# Patient Record
Sex: Female | Born: 1985 | Race: Black or African American | Hispanic: No | Smoking: Never smoker
Health system: Southern US, Community
[De-identification: ages and names within clinical notes are randomized; demographics above are authoritative.]

## PROBLEM LIST (undated history)

## (undated) ENCOUNTER — Inpatient Hospital Stay (HOSPITAL_COMMUNITY): Payer: Self-pay

## (undated) DIAGNOSIS — R609 Edema, unspecified: Secondary | ICD-10-CM

## (undated) DIAGNOSIS — F32A Depression, unspecified: Secondary | ICD-10-CM

## (undated) DIAGNOSIS — Z822 Family history of deafness and hearing loss: Secondary | ICD-10-CM

## (undated) DIAGNOSIS — IMO0002 Reserved for concepts with insufficient information to code with codable children: Secondary | ICD-10-CM

## (undated) DIAGNOSIS — M2550 Pain in unspecified joint: Secondary | ICD-10-CM

## (undated) DIAGNOSIS — R0602 Shortness of breath: Secondary | ICD-10-CM

## (undated) DIAGNOSIS — N921 Excessive and frequent menstruation with irregular cycle: Secondary | ICD-10-CM

## (undated) DIAGNOSIS — R0789 Other chest pain: Secondary | ICD-10-CM

## (undated) DIAGNOSIS — R87619 Unspecified abnormal cytological findings in specimens from cervix uteri: Secondary | ICD-10-CM

## (undated) DIAGNOSIS — R600 Localized edema: Secondary | ICD-10-CM

## (undated) DIAGNOSIS — F419 Anxiety disorder, unspecified: Secondary | ICD-10-CM

## (undated) DIAGNOSIS — M7989 Other specified soft tissue disorders: Secondary | ICD-10-CM

## (undated) DIAGNOSIS — Z86018 Personal history of other benign neoplasm: Secondary | ICD-10-CM

## (undated) DIAGNOSIS — M549 Dorsalgia, unspecified: Secondary | ICD-10-CM

## (undated) DIAGNOSIS — F329 Major depressive disorder, single episode, unspecified: Secondary | ICD-10-CM

## (undated) DIAGNOSIS — A6000 Herpesviral infection of urogenital system, unspecified: Secondary | ICD-10-CM

## (undated) HISTORY — DX: Other chest pain: R07.89

## (undated) HISTORY — PX: COLPOSCOPY W/ BIOPSY / CURETTAGE: SUR283

## (undated) HISTORY — DX: Herpesviral infection of urogenital system, unspecified: A60.00

## (undated) HISTORY — DX: Localized edema: R60.0

## (undated) HISTORY — DX: Personal history of other benign neoplasm: Z86.018

## (undated) HISTORY — DX: Excessive and frequent menstruation with irregular cycle: N92.1

## (undated) HISTORY — DX: Other specified soft tissue disorders: M79.89

## (undated) HISTORY — DX: Family history of deafness and hearing loss: Z82.2

## (undated) HISTORY — DX: Depression, unspecified: F32.A

## (undated) HISTORY — DX: Pain in unspecified joint: M25.50

## (undated) HISTORY — DX: Dorsalgia, unspecified: M54.9

## (undated) HISTORY — DX: Edema, unspecified: R60.9

## (undated) HISTORY — DX: Major depressive disorder, single episode, unspecified: F32.9

## (undated) HISTORY — DX: Shortness of breath: R06.02

## (undated) HISTORY — DX: Anxiety disorder, unspecified: F41.9

---
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
## 2003-07-17 ENCOUNTER — Emergency Department (HOSPITAL_COMMUNITY): Admission: EM | Admit: 2003-07-17 | Discharge: 2003-07-17 | Payer: Self-pay | Admitting: Emergency Medicine

## 2003-08-06 ENCOUNTER — Other Ambulatory Visit: Admission: RE | Admit: 2003-08-06 | Discharge: 2003-08-06 | Payer: Self-pay | Admitting: *Deleted

## 2004-12-25 ENCOUNTER — Emergency Department (HOSPITAL_COMMUNITY): Admission: EM | Admit: 2004-12-25 | Discharge: 2004-12-25 | Payer: Self-pay | Admitting: Emergency Medicine

## 2005-08-13 ENCOUNTER — Inpatient Hospital Stay (HOSPITAL_COMMUNITY): Admission: AD | Admit: 2005-08-13 | Discharge: 2005-08-13 | Payer: Self-pay | Admitting: Obstetrics and Gynecology

## 2006-04-19 ENCOUNTER — Inpatient Hospital Stay (HOSPITAL_COMMUNITY): Admission: AD | Admit: 2006-04-19 | Discharge: 2006-04-19 | Payer: Self-pay | Admitting: Obstetrics

## 2006-04-23 ENCOUNTER — Ambulatory Visit (HOSPITAL_COMMUNITY): Admission: RE | Admit: 2006-04-23 | Discharge: 2006-04-23 | Payer: Self-pay | Admitting: Obstetrics

## 2006-06-06 ENCOUNTER — Inpatient Hospital Stay (HOSPITAL_COMMUNITY): Admission: AD | Admit: 2006-06-06 | Discharge: 2006-06-07 | Payer: Self-pay | Admitting: Obstetrics & Gynecology

## 2006-08-26 ENCOUNTER — Inpatient Hospital Stay (HOSPITAL_COMMUNITY): Admission: AD | Admit: 2006-08-26 | Discharge: 2006-08-26 | Payer: Self-pay | Admitting: Obstetrics & Gynecology

## 2006-09-12 ENCOUNTER — Inpatient Hospital Stay (HOSPITAL_COMMUNITY): Admission: AD | Admit: 2006-09-12 | Discharge: 2006-09-12 | Payer: Self-pay | Admitting: Obstetrics & Gynecology

## 2006-11-04 ENCOUNTER — Inpatient Hospital Stay (HOSPITAL_COMMUNITY): Admission: AD | Admit: 2006-11-04 | Discharge: 2006-11-04 | Payer: Self-pay | Admitting: Obstetrics & Gynecology

## 2006-11-15 ENCOUNTER — Inpatient Hospital Stay (HOSPITAL_COMMUNITY): Admission: AD | Admit: 2006-11-15 | Discharge: 2006-11-15 | Payer: Self-pay | Admitting: Obstetrics

## 2006-11-22 ENCOUNTER — Emergency Department (HOSPITAL_COMMUNITY): Admission: EM | Admit: 2006-11-22 | Discharge: 2006-11-22 | Payer: Self-pay | Admitting: Emergency Medicine

## 2006-12-11 ENCOUNTER — Inpatient Hospital Stay (HOSPITAL_COMMUNITY): Admission: AD | Admit: 2006-12-11 | Discharge: 2006-12-13 | Payer: Self-pay | Admitting: Obstetrics

## 2007-06-22 ENCOUNTER — Emergency Department (HOSPITAL_COMMUNITY): Admission: EM | Admit: 2007-06-22 | Discharge: 2007-06-22 | Payer: Self-pay | Admitting: Emergency Medicine

## 2007-07-16 ENCOUNTER — Encounter: Admission: RE | Admit: 2007-07-16 | Discharge: 2007-10-03 | Payer: Self-pay | Admitting: *Deleted

## 2007-07-17 ENCOUNTER — Emergency Department (HOSPITAL_COMMUNITY): Admission: EM | Admit: 2007-07-17 | Discharge: 2007-07-17 | Payer: Self-pay | Admitting: Emergency Medicine

## 2007-12-01 ENCOUNTER — Emergency Department (HOSPITAL_COMMUNITY): Admission: EM | Admit: 2007-12-01 | Discharge: 2007-12-01 | Payer: Self-pay | Admitting: Family Medicine

## 2007-12-28 ENCOUNTER — Inpatient Hospital Stay (HOSPITAL_COMMUNITY): Admission: AD | Admit: 2007-12-28 | Discharge: 2007-12-28 | Payer: Self-pay | Admitting: Obstetrics & Gynecology

## 2008-05-17 ENCOUNTER — Inpatient Hospital Stay (HOSPITAL_COMMUNITY): Admission: AD | Admit: 2008-05-17 | Discharge: 2008-05-18 | Payer: Self-pay | Admitting: Obstetrics

## 2008-08-08 ENCOUNTER — Emergency Department (HOSPITAL_COMMUNITY): Admission: EM | Admit: 2008-08-08 | Discharge: 2008-08-08 | Payer: Self-pay | Admitting: Family Medicine

## 2008-09-12 ENCOUNTER — Emergency Department (HOSPITAL_COMMUNITY): Admission: EM | Admit: 2008-09-12 | Discharge: 2008-09-12 | Payer: Self-pay | Admitting: Family Medicine

## 2009-05-25 ENCOUNTER — Inpatient Hospital Stay (HOSPITAL_COMMUNITY): Admission: AD | Admit: 2009-05-25 | Discharge: 2009-05-25 | Payer: Self-pay | Admitting: Obstetrics

## 2009-05-28 ENCOUNTER — Inpatient Hospital Stay (HOSPITAL_COMMUNITY): Admission: AD | Admit: 2009-05-28 | Discharge: 2009-05-29 | Payer: Self-pay | Admitting: Obstetrics & Gynecology

## 2009-06-23 ENCOUNTER — Inpatient Hospital Stay (HOSPITAL_COMMUNITY): Admission: AD | Admit: 2009-06-23 | Discharge: 2009-06-23 | Payer: Self-pay | Admitting: Obstetrics & Gynecology

## 2009-08-06 ENCOUNTER — Inpatient Hospital Stay (HOSPITAL_COMMUNITY): Admission: AD | Admit: 2009-08-06 | Discharge: 2009-08-06 | Payer: Self-pay | Admitting: Obstetrics & Gynecology

## 2009-09-03 ENCOUNTER — Emergency Department (HOSPITAL_COMMUNITY): Admission: EM | Admit: 2009-09-03 | Discharge: 2009-09-03 | Payer: Self-pay | Admitting: Emergency Medicine

## 2009-12-15 ENCOUNTER — Emergency Department (HOSPITAL_COMMUNITY): Admission: EM | Admit: 2009-12-15 | Discharge: 2009-12-16 | Payer: Self-pay | Admitting: Emergency Medicine

## 2010-01-08 ENCOUNTER — Emergency Department (HOSPITAL_COMMUNITY): Admission: EM | Admit: 2010-01-08 | Discharge: 2010-01-08 | Payer: Self-pay | Admitting: Emergency Medicine

## 2010-03-06 IMAGING — CR DG ANKLE COMPLETE 3+V*R*
3 series · 3 of 3 positions shown · non-contrast
Comparison: None

CLINICAL DATA: Right ankle pain, twist injury, fall

RIGHT ANKLE - COMPLETE 3+ VIEW

[t ankle joint ap right]
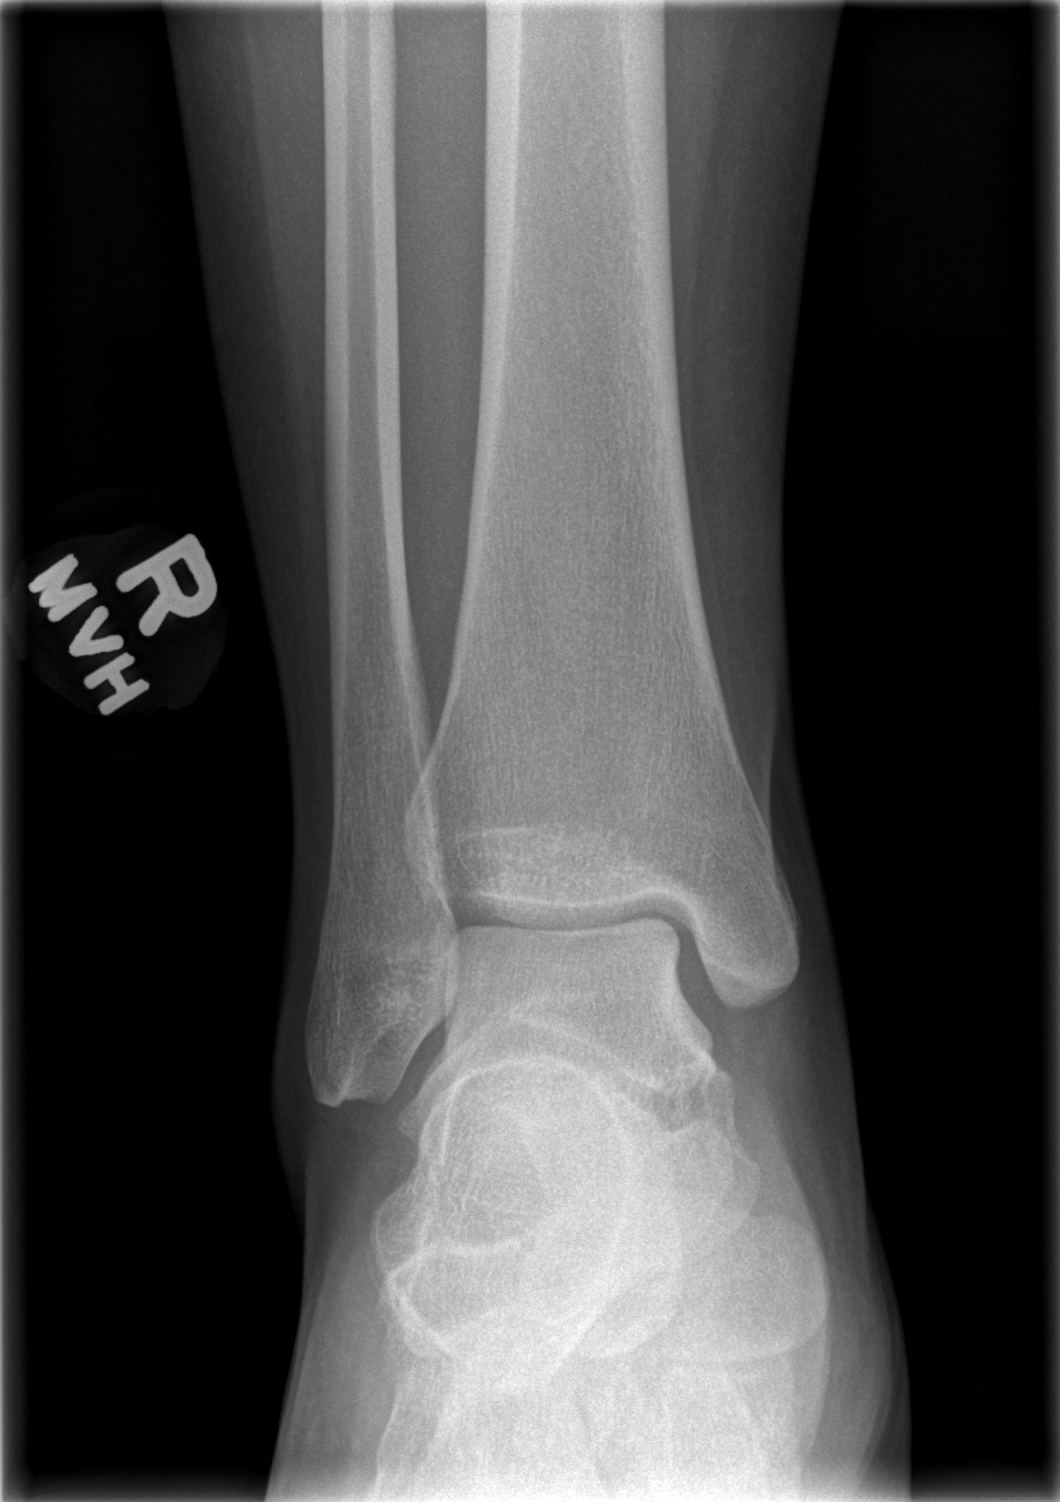

[t ankle joint oblique right]
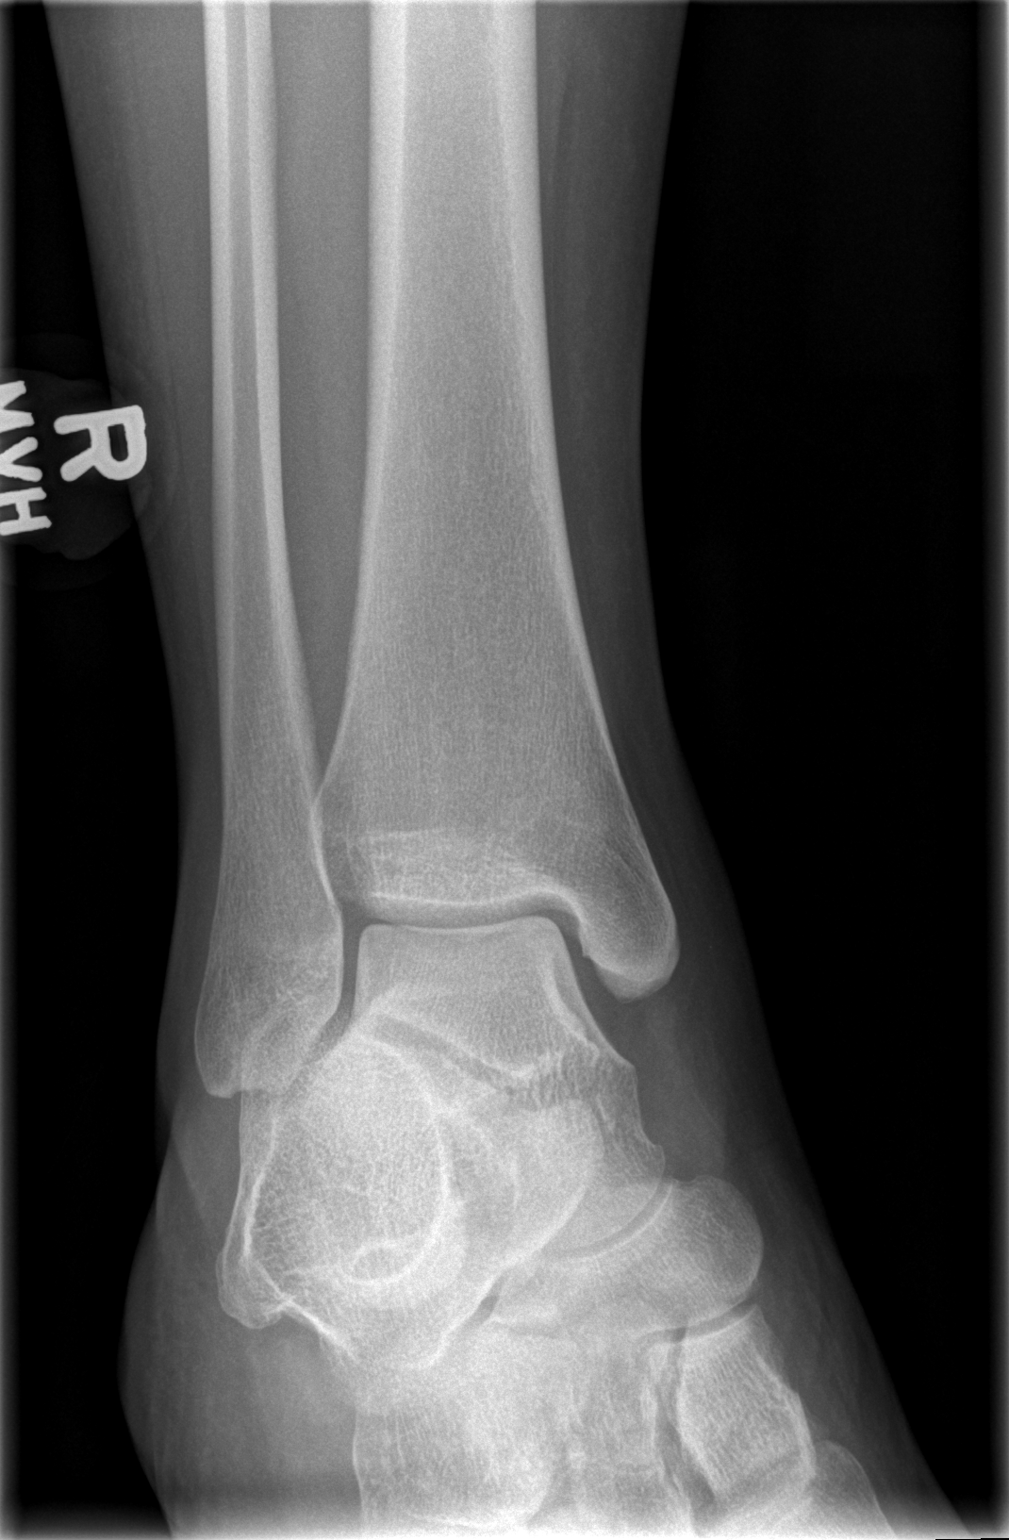

[t ankle joint lat right]
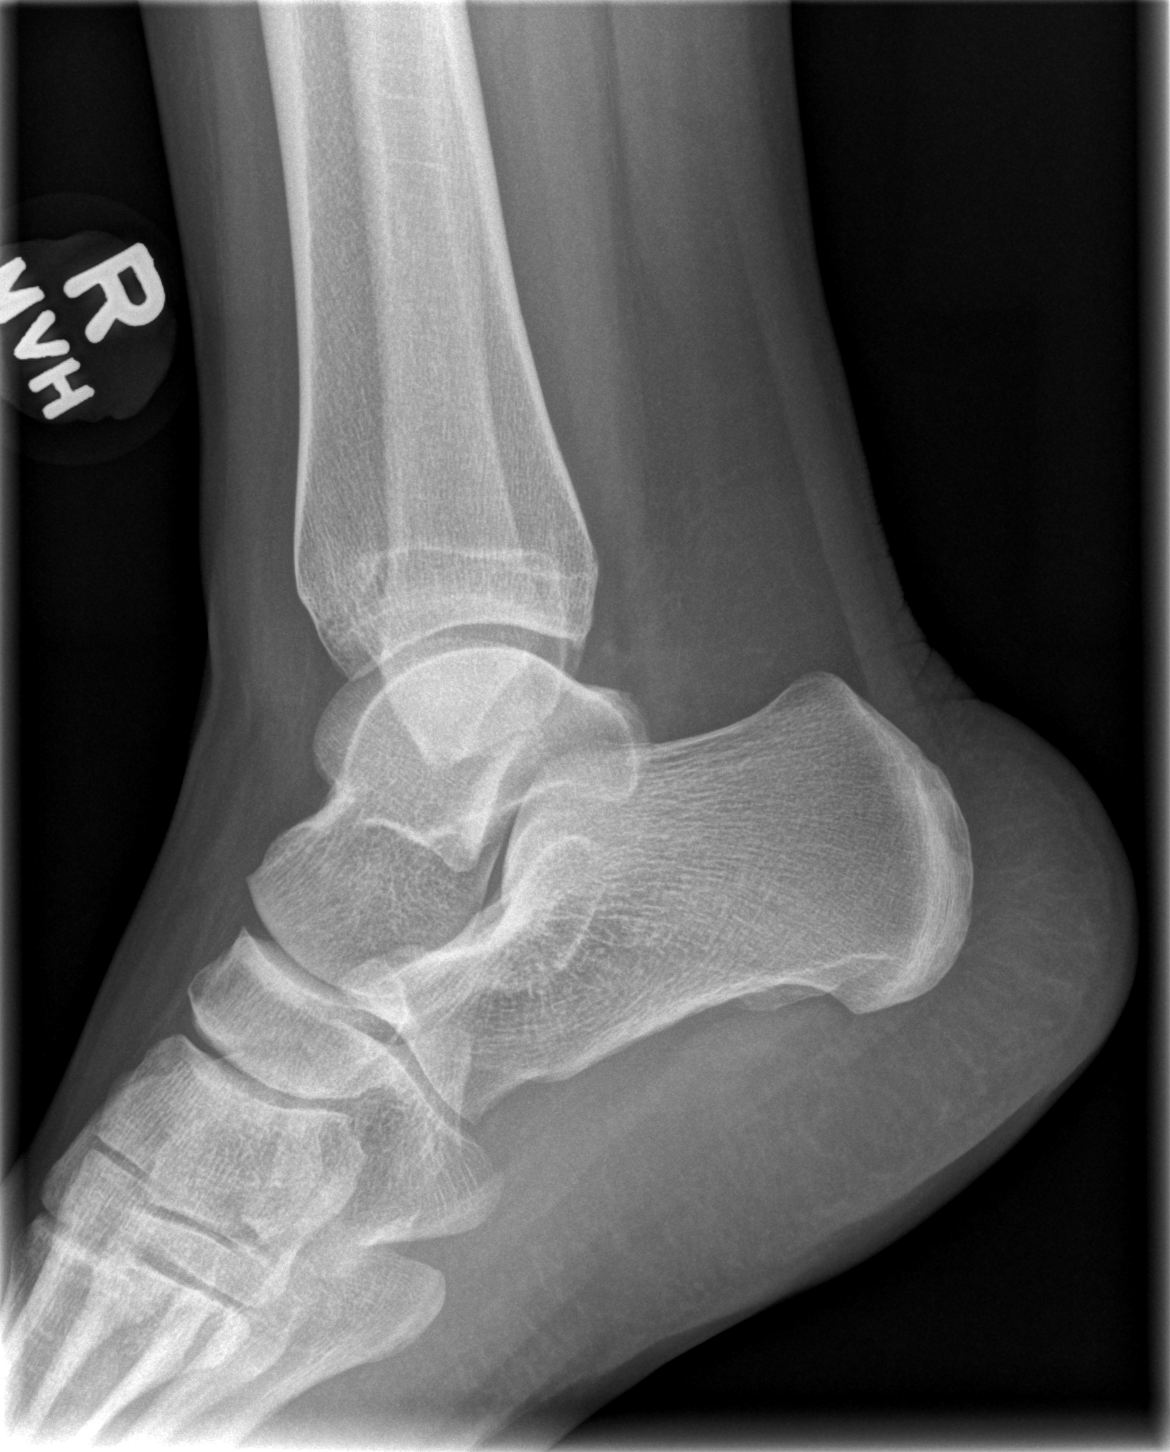

[3 of 3 positions shown; findings below may reference images not displayed]

FINDINGS: Bone mineralization normal.
Joint spaces preserved.
No fracture, dislocation, or bone destruction.
IMPRESSION: No acute bony abnormalities.

## 2010-03-22 ENCOUNTER — Emergency Department (HOSPITAL_COMMUNITY): Admission: EM | Admit: 2010-03-22 | Discharge: 2010-03-22 | Payer: Self-pay | Admitting: Emergency Medicine

## 2010-12-07 ENCOUNTER — Inpatient Hospital Stay (HOSPITAL_COMMUNITY)
Admission: AD | Admit: 2010-12-07 | Discharge: 2010-12-07 | Payer: Self-pay | Source: Home / Self Care | Attending: Obstetrics | Admitting: Obstetrics

## 2011-01-21 ENCOUNTER — Inpatient Hospital Stay (HOSPITAL_COMMUNITY)
Admission: AD | Admit: 2011-01-21 | Discharge: 2011-01-22 | Payer: Self-pay | Source: Home / Self Care | Attending: Obstetrics | Admitting: Obstetrics

## 2011-01-22 LAB — CBC
HCT: 36.4 % (ref 36.0–46.0)
MCHC: 33.2 g/dL (ref 30.0–36.0)
MCV: 86.9 fL (ref 78.0–100.0)
Platelets: 206 10*3/uL (ref 150–400)
RBC: 4.19 MIL/uL (ref 3.87–5.11)
WBC: 4.2 10*3/uL (ref 4.0–10.5)

## 2011-01-22 LAB — URINALYSIS, ROUTINE W REFLEX MICROSCOPIC: pH: 6 (ref 5.0–8.0)

## 2011-01-22 LAB — POCT PREGNANCY, URINE: Preg Test, Ur: NEGATIVE

## 2011-01-22 LAB — WET PREP, GENITAL

## 2011-01-22 LAB — HCG, QUANTITATIVE, PREGNANCY: hCG, Beta Chain, Quant, S: <2 m[IU]/mL (ref <5)

## 2011-01-23 LAB — GC/CHLAMYDIA PROBE AMP, GENITAL
Chlamydia, DNA Probe: NEGATIVE
GC Probe Amp, Genital: NEGATIVE

## 2011-02-19 ENCOUNTER — Inpatient Hospital Stay (INDEPENDENT_AMBULATORY_CARE_PROVIDER_SITE_OTHER)
Admission: RE | Admit: 2011-02-19 | Discharge: 2011-02-19 | Disposition: A | Discharge status: Home / Self Care | Payer: Medicaid Other | Source: Ambulatory Visit | Attending: Emergency Medicine | Admitting: Emergency Medicine

## 2011-02-19 DIAGNOSIS — J029 Acute pharyngitis, unspecified: Secondary | ICD-10-CM

## 2011-02-19 DIAGNOSIS — F411 Generalized anxiety disorder: Secondary | ICD-10-CM

## 2011-03-05 LAB — POCT PREGNANCY, URINE: Preg Test, Ur: NEGATIVE

## 2011-03-05 LAB — URINALYSIS, ROUTINE W REFLEX MICROSCOPIC
Bilirubin Urine: NEGATIVE
Glucose, UA: NEGATIVE mg/dL
Hgb urine dipstick: NEGATIVE
Ketones, ur: 40 mg/dL — AB
Nitrite: NEGATIVE
Protein, ur: NEGATIVE mg/dL
Specific Gravity, Urine: >1.03 — ABNORMAL HIGH (ref 1.005–1.030)
Urobilinogen, UA: 0.2 mg/dL (ref 0.0–1.0)
pH: 6 (ref 5.0–8.0)

## 2011-03-05 LAB — CBC
HCT: 37.7 % (ref 36.0–46.0)
Hemoglobin: 12.8 g/dL (ref 12.0–15.0)
MCH: 29.7 pg (ref 26.0–34.0)
MCHC: 34 g/dL (ref 30.0–36.0)
MCV: 87.5 fL (ref 78.0–100.0)
Platelets: 233 K/uL (ref 150–400)
RBC: 4.31 MIL/uL (ref 3.87–5.11)
RDW: 12.5 % (ref 11.5–15.5)
WBC: 3.2 K/uL — ABNORMAL LOW (ref 4.0–10.5)

## 2011-03-11 LAB — URINALYSIS, ROUTINE W REFLEX MICROSCOPIC
Glucose, UA: NEGATIVE mg/dL
Hgb urine dipstick: NEGATIVE
Ketones, ur: NEGATIVE mg/dL
Protein, ur: NEGATIVE mg/dL
Urobilinogen, UA: 0.2 mg/dL (ref 0.0–1.0)

## 2011-03-11 LAB — GC/CHLAMYDIA PROBE AMP, GENITAL
Chlamydia, DNA Probe: NEGATIVE
GC Probe Amp, Genital: NEGATIVE

## 2011-03-11 LAB — WET PREP, GENITAL
Trich, Wet Prep: NONE SEEN
Yeast Wet Prep HPF POC: NONE SEEN

## 2011-03-26 LAB — POCT PREGNANCY, URINE: Preg Test, Ur: NEGATIVE

## 2011-03-26 LAB — URINALYSIS, ROUTINE W REFLEX MICROSCOPIC
Bilirubin Urine: NEGATIVE
Hgb urine dipstick: NEGATIVE
Ketones, ur: NEGATIVE mg/dL
Nitrite: NEGATIVE
Protein, ur: NEGATIVE mg/dL
Urobilinogen, UA: 1 mg/dL (ref 0.0–1.0)

## 2011-03-26 LAB — WET PREP, GENITAL
Trich, Wet Prep: NONE SEEN
Yeast Wet Prep HPF POC: NONE SEEN

## 2011-03-26 LAB — RAPID STREP SCREEN (MED CTR MEBANE ONLY): Streptococcus, Group A Screen (Direct): NEGATIVE

## 2011-03-26 LAB — URINE MICROSCOPIC-ADD ON

## 2011-03-31 LAB — DIFFERENTIAL
Basophils Absolute: 0 10*3/uL (ref 0.0–0.1)
Eosinophils Absolute: 0.1 10*3/uL (ref 0.0–0.7)
Eosinophils Relative: 3 % (ref 0–5)
Metamyelocytes Relative: 0 %
Myelocytes: 0 %
Promyelocytes Absolute: 0 %
nRBC: 0 /100 WBC

## 2011-03-31 LAB — URINALYSIS, ROUTINE W REFLEX MICROSCOPIC
Hgb urine dipstick: NEGATIVE
Nitrite: NEGATIVE
Specific Gravity, Urine: >1.03 — ABNORMAL HIGH (ref 1.005–1.030)
Urobilinogen, UA: 0.2 mg/dL (ref 0.0–1.0)

## 2011-03-31 LAB — CBC
HCT: 40.8 % (ref 36.0–46.0)
MCHC: 33.7 g/dL (ref 30.0–36.0)
MCV: 89.5 fL (ref 78.0–100.0)
Platelets: 262 10*3/uL (ref 150–400)
WBC: 4.1 10*3/uL (ref 4.0–10.5)

## 2011-03-31 LAB — GC/CHLAMYDIA PROBE AMP, GENITAL
Chlamydia, DNA Probe: NEGATIVE
GC Probe Amp, Genital: NEGATIVE

## 2011-04-01 LAB — WET PREP, GENITAL

## 2011-04-01 LAB — URINALYSIS, ROUTINE W REFLEX MICROSCOPIC
Glucose, UA: NEGATIVE mg/dL
Specific Gravity, Urine: >1.03 — ABNORMAL HIGH (ref 1.005–1.030)

## 2011-04-01 LAB — URINE MICROSCOPIC-ADD ON

## 2011-04-01 LAB — GC/CHLAMYDIA PROBE AMP, GENITAL: GC Probe Amp, Genital: NEGATIVE

## 2011-04-02 LAB — URINALYSIS, ROUTINE W REFLEX MICROSCOPIC
Glucose, UA: NEGATIVE mg/dL
Glucose, UA: NEGATIVE mg/dL
Hgb urine dipstick: NEGATIVE
Nitrite: NEGATIVE
Protein, ur: NEGATIVE mg/dL
Specific Gravity, Urine: 1.02 (ref 1.005–1.030)
Urobilinogen, UA: 0.2 mg/dL (ref 0.0–1.0)

## 2011-05-11 NOTE — Op Note (Signed)
   NAMESHERITA, DECOSTE                     ACCOUNT NO.:  [id number]   MEDICAL RECORD NO.:  [id number]                   PATIENT TYPE:  INP   LOCATION:  9121                                 FACILITY:  [facility]   PHYSICIAN:  Georgina Peer, M.D.              DATE OF BIRTH:  [birth year]   DATE OF PROCEDURE:  09/06/2002  DATE OF DISCHARGE:                                 OPERATIVE REPORT   PROCEDURE:  Assisted vacuum delivery.   SURGEON:  Georgina Peer, M.D.   REASON FOR OPERATIVE DELIVERY:  Recurrent deep variable decelerations in  second stage with vertex at +3.   BRIEF HISTORY:  [age]-year-old gravida 1, para 0 presented in labor and  progressed to complete dilation at 2155, pushed to +3 but deep variable  decelerations were noted with slow return.  Heart rate of the fetus was in  the 60-70s for up to two minutes.  It was felt that expediting delivery  would be in the best interest of the fetus.  Assisted vacuum delivery with  the Kiwi vacuum assisted was described.  Risks and complications including  accentuation of caput, hematoma, infection, laceration of scalp or head and  bleeding of scalp or head was outlined and accepted.  The Kiwi was applied  and within two contractions, vaginal delivery was affected.  The infant had  rotated from OP to OA on the perineum.  There was a loose nuchal cord x1  which easily slid over the head.  A female infant was delivered at 8.  Apgars were 8 and 9.  The infant cried spontaneously.  There was a three  vessel cord with spontaneous delivery of the placenta intact at 2322.  The  first degree LML laceration was repaired without needing further anesthesia.  The patient and the infant were doing well postpartum.   DESCRIPTION OF PROCEDURE:                                                Georgina Peer, M.D.    JPN/MEDQ  D:  09/06/2002  T:  09/07/2002  Job:  [id number]

## 2011-06-29 ENCOUNTER — Inpatient Hospital Stay (HOSPITAL_COMMUNITY)
Admission: EM | Admit: 2011-06-29 | Discharge: 2011-06-29 | Disposition: A | Discharge status: Home / Self Care | Payer: Medicaid Other | Source: Ambulatory Visit | Attending: Obstetrics and Gynecology | Admitting: Obstetrics and Gynecology

## 2011-06-29 ENCOUNTER — Inpatient Hospital Stay (HOSPITAL_COMMUNITY): Payer: Medicaid Other

## 2011-06-29 DIAGNOSIS — O9989 Other specified diseases and conditions complicating pregnancy, childbirth and the puerperium: Secondary | ICD-10-CM

## 2011-06-29 DIAGNOSIS — R109 Unspecified abdominal pain: Secondary | ICD-10-CM | POA: Insufficient documentation

## 2011-06-29 DIAGNOSIS — O99891 Other specified diseases and conditions complicating pregnancy: Secondary | ICD-10-CM | POA: Insufficient documentation

## 2011-06-29 LAB — CBC
HCT: 36.5 % (ref 36.0–46.0)
Hemoglobin: 12.2 g/dL (ref 12.0–15.0)
MCV: 89.9 fL (ref 78.0–100.0)
RBC: 4.06 MIL/uL (ref 3.87–5.11)
RDW: 12.7 % (ref 11.5–15.5)
WBC: 4.6 10*3/uL (ref 4.0–10.5)

## 2011-06-29 LAB — POCT PREGNANCY, URINE: Preg Test, Ur: POSITIVE

## 2011-06-29 LAB — URINALYSIS, ROUTINE W REFLEX MICROSCOPIC
Glucose, UA: NEGATIVE mg/dL
Protein, ur: NEGATIVE mg/dL
pH: 6 (ref 5.0–8.0)

## 2011-07-01 ENCOUNTER — Inpatient Hospital Stay (HOSPITAL_COMMUNITY)
Admission: AD | Admit: 2011-07-01 | Discharge: 2011-07-01 | Disposition: A | Discharge status: Home / Self Care | Payer: Self-pay | Source: Ambulatory Visit | Attending: Obstetrics & Gynecology | Admitting: Obstetrics & Gynecology

## 2011-07-01 ENCOUNTER — Inpatient Hospital Stay (HOSPITAL_COMMUNITY): Payer: Self-pay | Admitting: Obstetrics & Gynecology

## 2011-07-01 NOTE — ED Provider Notes (Signed)
History     Chief Complaint  Patient presents with  . Follow-up    repeat quant needed today   HPI  No past medical history on file.  No past surgical history on file.  No family history on file.  History  Substance Use Topics  . Smoking status: Not on file  . Smokeless tobacco: Not on file  . Alcohol Use: Not on file    OB History    No data available      Review of Systems  Physical Exam  There were no vitals taken for this visit.  Physical Exam  ED Course  Procedures  MDM Order placed and when lab went to draw blood, client was not in the lobby.  Called client and spoke with her.  She did not want to wait for blood draw.  Advised her blood result today was part of her full evaluation.  Client does not think she needs her blood drawn today and plans to make an appointment tomorrow with her PMD - [facility].  Does not plan to return for blood work here.        Nolene Bernheim, NP 07/01/11 1150

## 2011-07-01 NOTE — Progress Notes (Signed)
Patient here for repeat BHCG

## 2011-07-01 NOTE — Progress Notes (Signed)
Called patient to triage not in lobby, lab personnel also could not locate patient

## 2011-07-20 ENCOUNTER — Inpatient Hospital Stay (HOSPITAL_COMMUNITY)
Admission: AD | Admit: 2011-07-20 | Discharge: 2011-07-21 | Disposition: A | Discharge status: Home / Self Care | Payer: Medicaid Other | Source: Ambulatory Visit | Attending: Obstetrics & Gynecology | Admitting: Obstetrics & Gynecology

## 2011-07-20 ENCOUNTER — Encounter (HOSPITAL_COMMUNITY): Payer: Self-pay | Admitting: *Deleted

## 2011-07-20 DIAGNOSIS — M549 Dorsalgia, unspecified: Secondary | ICD-10-CM | POA: Insufficient documentation

## 2011-07-20 DIAGNOSIS — Z1389 Encounter for screening for other disorder: Secondary | ICD-10-CM

## 2011-07-20 DIAGNOSIS — Z363 Encounter for antenatal screening for malformations: Secondary | ICD-10-CM | POA: Insufficient documentation

## 2011-07-20 DIAGNOSIS — R109 Unspecified abdominal pain: Secondary | ICD-10-CM | POA: Insufficient documentation

## 2011-07-20 DIAGNOSIS — Z3490 Encounter for supervision of normal pregnancy, unspecified, unspecified trimester: Secondary | ICD-10-CM

## 2011-07-20 LAB — URINALYSIS, ROUTINE W REFLEX MICROSCOPIC
Glucose, UA: NEGATIVE mg/dL
Leukocytes, UA: NEGATIVE
Nitrite: NEGATIVE
Specific Gravity, Urine: >1.03 — ABNORMAL HIGH (ref 1.005–1.030)
pH: 5.5 (ref 5.0–8.0)

## 2011-07-20 LAB — HCG, QUANTITATIVE, PREGNANCY: hCG, Beta Chain, Quant, S: 82385 m[IU]/mL — ABNORMAL HIGH (ref <5)

## 2011-07-20 NOTE — Progress Notes (Signed)
Pt states, " I've had pain in my right lower abd and low back for three days. Last night I think I had a fever. I was real hot and was sweating. I was here 3-4 wks ago and was told to come back for repeat labs but I didn't come.I'v had an ectopic pregnancy before so now I am worried."

## 2011-07-20 NOTE — Progress Notes (Signed)
G5P2 ECtopic 1 and TAB 1. Cramping in lower abd which has been worse past 3 days. Makes R leg "feel numb". States was very hot last night and had a fever but did not take temp. Was concerned about cramping due to hx of ectopic. Denies bleeding. Sometimes pain "stops me in my tracks it gets so bad". Has appt Aug 28th with [facility] to begin :[facility]

## 2011-07-21 ENCOUNTER — Inpatient Hospital Stay (HOSPITAL_COMMUNITY): Payer: Medicaid Other

## 2011-07-21 NOTE — ED Notes (Signed)
0040 pt to u/s via w/c

## 2011-07-21 NOTE — ED Provider Notes (Signed)
History     Chief Complaint  Patient presents with  . Abdominal Pain  . Back Pain   HPI Presents with c/o severe pelvic cramping. Is concerned she may have an ectopic.    No past medical history on file.  No past surgical history on file.  No family history on file.  History  Substance Use Topics  . Smoking status: Not on file  . Smokeless tobacco: Not on file  . Alcohol Use: Not on file    Allergies: No Known Allergies  Prescriptions prior to admission  Medication Sig Dispense Refill  . prenatal vitamin w/FE, FA (PRENATAL 1 + 1) 27-1 MG TABS Take 1 tablet by mouth daily.          ROS Physical Exam   Blood pressure 109/68, pulse 68, temperature 98.6 F (37 C), temperature source Oral, resp. rate 20, height 5\' 5"  (1.651 m), weight 172 lb 8 oz (78.245 kg), last menstrual period 06/03/2011.  Physical Exam Abdomen soft and nontender. No guarding or rebound. Pelvic recently done, so deferred today.  U/S done which showed IUP with FHR 127.  No evidence of complications. No bleeding.   MAU Course  Procedures   Assessment and Plan  IUP with [facility] 03/09/12 No ectopic Recommend establishment of PNC asap. Pt agreeable.  Wynelle Bourgeois 07/21/2011, 2:51 AM

## 2011-07-21 NOTE — ED Notes (Signed)
61 Wynelle Bourgeois CNM in to discuss u/s and lab results and d/c plan with pt.

## 2011-07-21 NOTE — ED Notes (Signed)
PB and crackers with soda to pt 

## 2011-08-14 LAB — HIV ANTIBODY (ROUTINE TESTING W REFLEX): HIV: NONREACTIVE

## 2011-08-14 LAB — HEPATITIS B SURFACE ANTIGEN: Hepatitis B Surface Ag: NEGATIVE

## 2011-08-14 LAB — RUBELLA ANTIBODY, IGM: Rubella: IMMUNE

## 2011-08-14 LAB — RPR: RPR: NONREACTIVE

## 2011-08-23 ENCOUNTER — Ambulatory Visit (HOSPITAL_COMMUNITY): Payer: Self-pay

## 2011-08-23 ENCOUNTER — Encounter (HOSPITAL_COMMUNITY): Payer: Self-pay

## 2011-09-02 ENCOUNTER — Encounter (HOSPITAL_COMMUNITY): Payer: Self-pay | Admitting: Advanced Practice Midwife

## 2011-09-02 ENCOUNTER — Inpatient Hospital Stay (HOSPITAL_COMMUNITY)
Admission: AD | Admit: 2011-09-02 | Discharge: 2011-09-02 | Disposition: A | Discharge status: Home / Self Care | Payer: Self-pay | Source: Ambulatory Visit | Attending: Obstetrics & Gynecology | Admitting: Obstetrics & Gynecology

## 2011-09-02 ENCOUNTER — Inpatient Hospital Stay (HOSPITAL_COMMUNITY): Payer: Self-pay

## 2011-09-02 DIAGNOSIS — O9989 Other specified diseases and conditions complicating pregnancy, childbirth and the puerperium: Secondary | ICD-10-CM | POA: Insufficient documentation

## 2011-09-02 DIAGNOSIS — N949 Unspecified condition associated with female genital organs and menstrual cycle: Secondary | ICD-10-CM | POA: Insufficient documentation

## 2011-09-02 DIAGNOSIS — Z3490 Encounter for supervision of normal pregnancy, unspecified, unspecified trimester: Secondary | ICD-10-CM

## 2011-09-02 DIAGNOSIS — Z3480 Encounter for supervision of other normal pregnancy, unspecified trimester: Secondary | ICD-10-CM

## 2011-09-02 HISTORY — DX: Unspecified abnormal cytological findings in specimens from cervix uteri: R87.619

## 2011-09-02 HISTORY — DX: Reserved for concepts with insufficient information to code with codable children: IMO0002

## 2011-09-02 LAB — URINALYSIS, ROUTINE W REFLEX MICROSCOPIC
Nitrite: NEGATIVE
Protein, ur: NEGATIVE mg/dL
Specific Gravity, Urine: 1.025 (ref 1.005–1.030)
Urobilinogen, UA: 0.2 mg/dL (ref 0.0–1.0)

## 2011-09-02 NOTE — ED Provider Notes (Signed)
History     Chief Complaint  Patient presents with  . Abdominal Cramping   HPI Low abd pressure x 3 days, low abd cramping and "muscle spasms" x 2 weeks. Light pink spotting yesterday. Lots of emotional stress recently. Has first prenatal care appointment scheduled with Dr. Tamela Oddi later this month.   OB History    Grav Para Term Preterm Abortions TAB SAB Ect Mult Living   5 2 2  0 2   1  2       Past Medical History  Diagnosis Date  . Ectopic pregnancy   . Abnormal Pap smear     Past Surgical History  Procedure Date  . Colposcopy w/ biopsy / curettage     No family history on file.  History  Substance Use Topics  . Smoking status: Never Smoker   . Smokeless tobacco: Not on file  . Alcohol Use: No    Allergies: No Known Allergies  No prescriptions prior to admission    Review of Systems  Constitutional: Negative.   Respiratory: Negative.   Cardiovascular: Negative.   Gastrointestinal: Negative for nausea, vomiting, abdominal pain, diarrhea and constipation.  Genitourinary: Negative for dysuria, urgency, frequency, hematuria and flank pain.       Positive for spotting and cramping  Musculoskeletal: Negative.   Neurological: Negative.   Psychiatric/Behavioral: Negative.    Physical Exam   Blood pressure 125/72, pulse 78, resp. rate 18, last menstrual period 06/03/2011.  Physical Exam  Nursing note and vitals reviewed. Constitutional: She is oriented to person, place, and time. She appears well-developed and well-nourished. No distress.  HENT:  Head: Normocephalic and atraumatic.  Cardiovascular: Normal rate.   Respiratory: Effort normal.  GI: Soft. Bowel sounds are normal. She exhibits no mass. There is no tenderness. There is no rebound and no guarding.  Genitourinary: There is no rash or lesion on the right labia. There is no rash or lesion on the left labia. Uterus is not tender. Enlarged: Size c/w dates. Cervix exhibits no motion tenderness, no  discharge and no friability. Right adnexum displays no mass, no tenderness and no fullness. Left adnexum displays no mass, no tenderness and no fullness. No tenderness or bleeding around the vagina. Vaginal discharge (white) found.  Musculoskeletal: Normal range of motion.  Neurological: She is alert and oriented to person, place, and time.  Skin: Skin is warm and dry.  Psychiatric: She has a normal mood and affect.    MAU Course  Procedures  Results for orders placed during the hospital encounter of 09/02/11 (from the past 24 hour(s))  URINALYSIS, ROUTINE W REFLEX MICROSCOPIC     Status: Normal   Collection Time   09/02/11  8:50 AM      Component Value Range   Color, Urine YELLOW  YELLOW    Appearance CLEAR  CLEAR    Specific Gravity, Urine 1.025  1.005 - 1.030    pH 6.5  5.0 - 8.0    Glucose, UA NEGATIVE  NEGATIVE (mg/dL)   Hgb urine dipstick NEGATIVE  NEGATIVE    Bilirubin Urine NEGATIVE  NEGATIVE    Ketones, ur NEGATIVE  NEGATIVE (mg/dL)   Protein, ur NEGATIVE  NEGATIVE (mg/dL)   Urobilinogen, UA 0.2  0.0 - 1.0 (mg/dL)   Nitrite NEGATIVE  NEGATIVE    Leukocytes, UA NEGATIVE  NEGATIVE   WET PREP, GENITAL     Status: Abnormal   Collection Time   09/02/11 10:00 AM      Component Value Range  Yeast, Wet Prep NONE SEEN  NONE SEEN    Trich, Wet Prep NONE SEEN  NONE SEEN    Clue Cells, Wet Prep NONE SEEN  NONE SEEN    WBC, Wet Prep HPF POC FEW (*) NONE SEEN    US Ob Comp Less 14 Wks  09/02/2011  *RADIOLOGY REPORT*  Clinical Data: Pelvic pain and bleeding.  13 weeks and 0 days pregnant by last menstrual period and previous ultrasound.  OBSTETRIC <14 WK ULTRASOUND  Technique:  Transabdominal ultrasound was performed for evaluation of the gestation as well as the maternal uterus and adnexal regions.  Comparison:  Previous examinations, the most recent dated 07/21/2011.  Intrauterine gestational sac: Visualized/normal in shape. Yolk sac: Not visualized. Embryo: Visualized. Cardiac Activity:  Visualized. Heart Rate: 152 bpm  CRL:  74.1 mm  13w  4d        [place] EDC: 03/05/2012.  Maternal uterus/Adnexae: No subchorionic hemorrhage seen.  Left ovarian probable corpus luteum cyst.  Normal appearing right ovary.  No free peritoneal fluid.  IMPRESSION: Single live intrauterine gestation with an estimated gestational age by today's measurements of 13 weeks and 4 days.  This represents normal growth since the last examination.  No complicating features seen.  Original Report Authenticated By: Darrol Angel, M.D.    Assessment and Plan  25 y.o. [id number] [redacted]w[redacted]d Normal exam, likely round ligament pain Rev'd precautions F/U as scheduled  Shelbe Haglund 09/02/2011, 2:00 PM

## 2011-09-02 NOTE — ED Provider Notes (Signed)
Agree with above note.  LEGGETT,KELLY H. 09/02/2011 2:40 PM  

## 2011-09-02 NOTE — Progress Notes (Signed)
Cramping x 2 days sharp at times and pressure, past two weeks under stress, emotionally and physically, fell on her stomach yesterday.

## 2011-09-03 LAB — GC/CHLAMYDIA PROBE AMP, GENITAL
Chlamydia, DNA Probe: NEGATIVE
GC Probe Amp, Genital: NEGATIVE

## 2011-09-12 LAB — URINALYSIS, ROUTINE W REFLEX MICROSCOPIC
Bilirubin Urine: NEGATIVE
Hgb urine dipstick: NEGATIVE
Nitrite: NEGATIVE
Specific Gravity, Urine: >1.03 — ABNORMAL HIGH
Urobilinogen, UA: 0.2
pH: 5.5

## 2011-09-12 LAB — POCT PREGNANCY, URINE
Operator id: 275371
Preg Test, Ur: NEGATIVE

## 2011-09-12 LAB — WET PREP, GENITAL
Clue Cells Wet Prep HPF POC: NONE SEEN
Trich, Wet Prep: NONE SEEN
Yeast Wet Prep HPF POC: NONE SEEN

## 2011-09-12 LAB — GC/CHLAMYDIA PROBE AMP, GENITAL: GC Probe Amp, Genital: NEGATIVE

## 2011-09-19 LAB — CBC
MCHC: 34
MCV: 87.9
Platelets: 300
RBC: 4.31
RDW: 12.2

## 2011-09-19 LAB — WET PREP, GENITAL: Yeast Wet Prep HPF POC: NONE SEEN

## 2011-09-19 LAB — POCT PREGNANCY, URINE
Operator id: 11897
Preg Test, Ur: POSITIVE

## 2011-09-21 ENCOUNTER — Encounter (HOSPITAL_COMMUNITY): Payer: Self-pay

## 2011-09-21 ENCOUNTER — Inpatient Hospital Stay (HOSPITAL_COMMUNITY)
Admission: AD | Admit: 2011-09-21 | Discharge: 2011-09-21 | Disposition: A | Discharge status: Home / Self Care | Payer: Medicaid Other | Source: Ambulatory Visit | Attending: Obstetrics & Gynecology | Admitting: Obstetrics & Gynecology

## 2011-09-21 DIAGNOSIS — R109 Unspecified abdominal pain: Secondary | ICD-10-CM | POA: Insufficient documentation

## 2011-09-21 DIAGNOSIS — O99891 Other specified diseases and conditions complicating pregnancy: Secondary | ICD-10-CM | POA: Insufficient documentation

## 2011-09-21 DIAGNOSIS — N949 Unspecified condition associated with female genital organs and menstrual cycle: Secondary | ICD-10-CM

## 2011-09-21 LAB — URINALYSIS, ROUTINE W REFLEX MICROSCOPIC
Ketones, ur: NEGATIVE mg/dL
Leukocytes, UA: NEGATIVE
Nitrite: NEGATIVE
pH: 6 (ref 5.0–8.0)

## 2011-09-21 NOTE — ED Provider Notes (Signed)
History     CSN: [id number] Arrival date & time: 09/21/2011  8:21 PM  Chief Complaint  Patient presents with  . Abdominal Pain  . Back Pain    HPI Valerie Robbins is a 25 y.o. female who presents to MAU for abdominal pain in pregnancy @ 16.[redacted] weeks gestation.She describes the pain as a pulling sensation on the sides of her abdomen that she feels more at night after she has been standing and walking a lot. She has had previous visits to MAU with complete evaluations including ultrasound. She states that she just went to Dr. Marcia Brash office last week and had pelvic exam with cultures that were negative. She feels that the baby is not moving as much as usual.   Past Medical History  Diagnosis Date  . Ectopic pregnancy   . Abnormal Pap smear   . Anxiety   . Depression     Past Surgical History  Procedure Date  . Colposcopy w/ biopsy / curettage     No family history on file.  History  Substance Use Topics  . Smoking status: Never Smoker   . Smokeless tobacco: Not on file  . Alcohol Use: No    OB History    Grav Para Term Preterm Abortions TAB SAB Ect Mult Living   5 2 2  0 2 1 0 1 0 2      Review of Systems  Gastrointestinal: Positive for abdominal pain.  Genitourinary: Negative for dysuria, urgency, frequency, vaginal bleeding, vaginal discharge and genital sores.       [redacted] weeks gestation    Allergies  Review of patient's allergies indicates no known allergies.  Home Medications  No current outpatient prescriptions on file.  BP 113/74  Pulse 69  Temp(Src) 99.4 F (37.4 C) (Oral)  Resp 16  Ht 5' 5.75" (1.67 m)  Wt 184 lb 6 oz (83.632 kg)  BMI 29.99 kg/m2  LMP 06/03/2011  Physical Exam  Nursing note and vitals reviewed. Constitutional: She is oriented to person, place, and time. She appears well-developed and well-nourished.  Eyes: EOM are normal.  Neck: Neck supple.  Pulmonary/Chest: Effort normal.  Abdominal: Soft. There is no tenderness.       Gravid at [redacted] weeks gestation with +FHT by doppler.  Musculoskeletal: Normal range of motion.  Neurological: She is alert and oriented to person, place, and time. No cranial nerve deficit.  Skin: Skin is warm and dry.     ED Course: Patient does not want pelvic exam repeated and does not want lab work tonight.   Assessment: Round ligament pain  Plan:               Informal bedside ultrasound, patient able to visualize active IUP and cardiac activity. She will follow up with Dr. Tamela Oddi.  Procedures   MDM          Kerrie Buffalo, NP 09/21/11 2238

## 2011-09-21 NOTE — Progress Notes (Signed)
Pt states, " I've had low abdominal and low back pain and pressure for the past two days.  It shoots up the sides too.I was laying down before I came here and I tried to get upp and I had a stabbing pain in my low abdomen.Any type of movement makes it hurt. I haven't felt the baby ibn two days."

## 2011-09-24 LAB — POCT URINALYSIS DIP (DEVICE)
Glucose, UA: NEGATIVE
Nitrite: NEGATIVE
Urobilinogen, UA: 1
pH: 6.5

## 2011-09-24 LAB — POCT PREGNANCY, URINE: Preg Test, Ur: NEGATIVE

## 2011-10-01 LAB — POCT URINALYSIS DIP (DEVICE)
Protein, ur: NEGATIVE
Specific Gravity, Urine: >=1.03
pH: 6

## 2011-10-01 LAB — GC/CHLAMYDIA PROBE AMP, GENITAL
Chlamydia, DNA Probe: NEGATIVE
GC Probe Amp, Genital: NEGATIVE

## 2011-10-01 LAB — WET PREP, GENITAL

## 2011-10-04 ENCOUNTER — Emergency Department (HOSPITAL_COMMUNITY)
Admission: EM | Admit: 2011-10-04 | Discharge: 2011-10-05 | Disposition: A | Discharge status: Home / Self Care | Payer: Medicaid Other | Attending: Emergency Medicine | Admitting: Emergency Medicine

## 2011-10-04 DIAGNOSIS — F411 Generalized anxiety disorder: Secondary | ICD-10-CM | POA: Insufficient documentation

## 2011-10-04 DIAGNOSIS — R45851 Suicidal ideations: Secondary | ICD-10-CM | POA: Insufficient documentation

## 2011-10-04 DIAGNOSIS — F329 Major depressive disorder, single episode, unspecified: Secondary | ICD-10-CM | POA: Insufficient documentation

## 2011-10-04 DIAGNOSIS — F3289 Other specified depressive episodes: Secondary | ICD-10-CM | POA: Insufficient documentation

## 2011-10-04 DIAGNOSIS — O99340 Other mental disorders complicating pregnancy, unspecified trimester: Secondary | ICD-10-CM | POA: Insufficient documentation

## 2011-10-04 LAB — DIFFERENTIAL
Basophils Absolute: 0 10*3/uL (ref 0.0–0.1)
Basophils Relative: 0 % (ref 0–1)
Neutro Abs: 3.2 10*3/uL (ref 1.7–7.7)
Neutrophils Relative %: 62 % (ref 43–77)

## 2011-10-04 LAB — COMPREHENSIVE METABOLIC PANEL
ALT: 77 U/L — ABNORMAL HIGH (ref 0–35)
Alkaline Phosphatase: 61 U/L (ref 39–117)
CO2: 25 mEq/L (ref 19–32)
GFR calc Af Amer: >90 mL/min (ref >90)
Glucose, Bld: 72 mg/dL (ref 70–99)
Potassium: 3.9 mEq/L (ref 3.5–5.1)
Sodium: 134 mEq/L — ABNORMAL LOW (ref 135–145)
Total Protein: 7.7 g/dL (ref 6.0–8.3)

## 2011-10-04 LAB — RAPID URINE DRUG SCREEN, HOSP PERFORMED
Barbiturates: NOT DETECTED
Benzodiazepines: NOT DETECTED

## 2011-10-04 LAB — CBC
Hemoglobin: 10.9 g/dL — ABNORMAL LOW (ref 12.0–15.0)
MCHC: 33.7 g/dL (ref 30.0–36.0)
RBC: 3.65 MIL/uL — ABNORMAL LOW (ref 3.87–5.11)
WBC: 5.2 10*3/uL (ref 4.0–10.5)

## 2011-10-08 LAB — POCT URINALYSIS DIP (DEVICE)
Bilirubin Urine: NEGATIVE
Glucose, UA: NEGATIVE
Ketones, ur: NEGATIVE
Nitrite: NEGATIVE
pH: 5.5

## 2011-10-08 LAB — POCT PREGNANCY, URINE: Operator id: 239701

## 2011-10-10 LAB — POCT RAPID STREP A: Streptococcus, Group A Screen (Direct): NEGATIVE

## 2011-11-15 ENCOUNTER — Inpatient Hospital Stay (HOSPITAL_COMMUNITY): Payer: Medicaid Other

## 2011-11-15 ENCOUNTER — Inpatient Hospital Stay (HOSPITAL_COMMUNITY)
Admission: AD | Admit: 2011-11-15 | Discharge: 2011-11-15 | Disposition: A | Discharge status: Home / Self Care | Payer: Medicaid Other | Source: Ambulatory Visit | Attending: Obstetrics & Gynecology | Admitting: Obstetrics & Gynecology

## 2011-11-15 ENCOUNTER — Encounter (HOSPITAL_COMMUNITY): Payer: Self-pay | Admitting: *Deleted

## 2011-11-15 DIAGNOSIS — O99891 Other specified diseases and conditions complicating pregnancy: Secondary | ICD-10-CM | POA: Insufficient documentation

## 2011-11-15 DIAGNOSIS — Z363 Encounter for antenatal screening for malformations: Secondary | ICD-10-CM

## 2011-11-15 DIAGNOSIS — M5430 Sciatica, unspecified side: Secondary | ICD-10-CM | POA: Insufficient documentation

## 2011-11-15 DIAGNOSIS — Z3490 Encounter for supervision of normal pregnancy, unspecified, unspecified trimester: Secondary | ICD-10-CM

## 2011-11-15 DIAGNOSIS — G5700 Lesion of sciatic nerve, unspecified lower limb: Secondary | ICD-10-CM

## 2011-11-15 DIAGNOSIS — Z1389 Encounter for screening for other disorder: Secondary | ICD-10-CM

## 2011-11-15 DIAGNOSIS — R109 Unspecified abdominal pain: Secondary | ICD-10-CM | POA: Insufficient documentation

## 2011-11-15 DIAGNOSIS — R209 Unspecified disturbances of skin sensation: Secondary | ICD-10-CM | POA: Insufficient documentation

## 2011-11-15 DIAGNOSIS — N949 Unspecified condition associated with female genital organs and menstrual cycle: Secondary | ICD-10-CM

## 2011-11-15 NOTE — ED Provider Notes (Signed)
History     CSN: [id number] Arrival date & time: 11/15/2011  4:08 PM   None     Chief Complaint  Patient presents with  . Numbness   HPI Valerie Robbins IS A 25 y.o. female @ [redacted]w[redacted]d gestation who presents to MAU for left side abdominal pain that radiates to the left leg and has a numb feeling. She also reports not feeling the baby move as much as usual. The symptoms started Monday and she called the office and was to come for ultrasound but due to caring for her grandmother the patient was unable to come in until today. She also has had more discharge from her vagina and is concerned that her water may have broken. The history was provided by the patient.  Past Medical History  Diagnosis Date  . Ectopic pregnancy   . Abnormal Pap smear   . Anxiety   . Depression   . Asthma     Past Surgical History  Procedure Date  . Colposcopy w/ biopsy / curettage     No family history on file.  History  Substance Use Topics  . Smoking status: Never Smoker   . Smokeless tobacco: Not on file  . Alcohol Use: No    OB History    Grav Para Term Preterm Abortions TAB SAB Ect Mult Living   5 2 2  0 2 1 0 1 0 2      Review of Systems  Constitutional: Negative for fever, chills and activity change.  HENT: Negative.   Eyes: Negative.   Respiratory: Negative.   Cardiovascular: Negative.   Gastrointestinal: Negative for nausea, vomiting and abdominal pain.  Genitourinary: Positive for frequency. Negative for dysuria, vaginal bleeding, vaginal discharge, difficulty urinating and pelvic pain.       [redacted] week pregnant  Musculoskeletal: Positive for back pain.       Pain left hip with numbness down left leg.  Skin: Negative for rash.  Neurological: Negative for light-headedness and headaches.  Psychiatric/Behavioral: Negative for confusion and agitation.    Allergies  Review of patient's allergies indicates no known allergies.  Home Medications  No current outpatient prescriptions  on file.  BP 128/74  Pulse 76  Temp(Src) 99.1 F (37.3 C) (Oral)  Resp 16  Ht 5\' 5"  (1.651 m)  Wt 196 lb 6 oz (89.075 kg)  BMI 32.68 kg/m2  LMP 06/03/2011  Physical Exam  Nursing note and vitals reviewed. Constitutional: She is oriented to person, place, and time. She appears well-developed and well-nourished.  HENT:  Head: Normocephalic.  Eyes: EOM are normal.  Neck: Neck supple.  Cardiovascular: Normal rate.   Pulmonary/Chest: Effort normal.  Abdominal: Soft. There is no tenderness.  Musculoskeletal: Normal range of motion.       Tender over left sciatic nerve.  Neurological: She is alert and oriented to person, place, and time. She has normal strength. No cranial nerve deficit. Gait normal.  Skin: Skin is warm and dry.  Psychiatric: She has a normal mood and affect. Her behavior is normal. Judgment and thought content normal.   Assessment: Sciatic nerve pain   Round ligament pain in pregnancy  Plan:  Tylenol   Pregnancy girdle   Rest, follow up in the office  ED Course: Discussed with Dr. Tamela Oddi  Procedures            Kerrie Buffalo, NP 11/15/11 1814

## 2011-11-15 NOTE — Progress Notes (Signed)
L leg numbness al the way down x 2 days.  Pt states she has pain in her hip & leg as well, also lower back pain.  Lower abd pressure also started 2 days ago.

## 2011-11-21 ENCOUNTER — Other Ambulatory Visit: Payer: Self-pay | Admitting: Obstetrics & Gynecology

## 2011-11-21 DIAGNOSIS — O280 Abnormal hematological finding on antenatal screening of mother: Secondary | ICD-10-CM

## 2011-11-22 ENCOUNTER — Ambulatory Visit (HOSPITAL_COMMUNITY): Admission: RE | Admit: 2011-11-22 | Payer: Medicaid Other | Source: Ambulatory Visit

## 2011-11-22 ENCOUNTER — Other Ambulatory Visit: Payer: Self-pay | Admitting: Obstetrics & Gynecology

## 2011-11-22 ENCOUNTER — Ambulatory Visit (HOSPITAL_COMMUNITY)
Admission: RE | Admit: 2011-11-22 | Discharge: 2011-11-22 | Disposition: A | Discharge status: Home / Self Care | Payer: Medicaid Other | Source: Ambulatory Visit | Attending: Obstetrics & Gynecology | Admitting: Obstetrics & Gynecology

## 2011-11-22 DIAGNOSIS — O3440 Maternal care for other abnormalities of cervix, unspecified trimester: Secondary | ICD-10-CM | POA: Insufficient documentation

## 2011-11-22 DIAGNOSIS — Z1389 Encounter for screening for other disorder: Secondary | ICD-10-CM | POA: Insufficient documentation

## 2011-11-22 DIAGNOSIS — O280 Abnormal hematological finding on antenatal screening of mother: Secondary | ICD-10-CM

## 2011-11-22 DIAGNOSIS — O358XX0 Maternal care for other (suspected) fetal abnormality and damage, not applicable or unspecified: Secondary | ICD-10-CM | POA: Insufficient documentation

## 2011-11-22 DIAGNOSIS — Z363 Encounter for antenatal screening for malformations: Secondary | ICD-10-CM | POA: Insufficient documentation

## 2011-11-22 DIAGNOSIS — Z09 Encounter for follow-up examination after completed treatment for conditions other than malignant neoplasm: Secondary | ICD-10-CM

## 2011-11-23 ENCOUNTER — Other Ambulatory Visit: Payer: Self-pay

## 2011-12-11 ENCOUNTER — Ambulatory Visit: Payer: Medicaid Other | Admitting: Physical Therapy

## 2011-12-12 ENCOUNTER — Inpatient Hospital Stay (EMERGENCY_DEPARTMENT_HOSPITAL)
Admission: AD | Admit: 2011-12-12 | Discharge: 2011-12-13 | Disposition: A | Discharge status: Home / Self Care | Payer: Medicaid Other | Source: Ambulatory Visit | Attending: Obstetrics | Admitting: Obstetrics

## 2011-12-12 ENCOUNTER — Inpatient Hospital Stay (HOSPITAL_COMMUNITY)
Admission: AD | Admit: 2011-12-12 | Discharge: 2011-12-12 | Disposition: A | Discharge status: Home / Self Care | Payer: Medicaid Other | Source: Ambulatory Visit | Attending: Obstetrics | Admitting: Obstetrics

## 2011-12-12 ENCOUNTER — Encounter (HOSPITAL_COMMUNITY): Payer: Self-pay | Admitting: *Deleted

## 2011-12-12 DIAGNOSIS — O99340 Other mental disorders complicating pregnancy, unspecified trimester: Secondary | ICD-10-CM | POA: Insufficient documentation

## 2011-12-12 DIAGNOSIS — O26899 Other specified pregnancy related conditions, unspecified trimester: Secondary | ICD-10-CM

## 2011-12-12 DIAGNOSIS — O99891 Other specified diseases and conditions complicating pregnancy: Secondary | ICD-10-CM | POA: Insufficient documentation

## 2011-12-12 DIAGNOSIS — F341 Dysthymic disorder: Secondary | ICD-10-CM | POA: Insufficient documentation

## 2011-12-12 DIAGNOSIS — N898 Other specified noninflammatory disorders of vagina: Secondary | ICD-10-CM | POA: Insufficient documentation

## 2011-12-12 DIAGNOSIS — J45909 Unspecified asthma, uncomplicated: Secondary | ICD-10-CM | POA: Insufficient documentation

## 2011-12-12 NOTE — Progress Notes (Signed)
Pt took hrself off the monitor and sttes she will be back-when questioned about what was wrong-she does not respond-note she has 2 family members in the room who use sign language and are very animated and agitated and the pt appears to be upset as well-the pt also signs.

## 2011-12-12 NOTE — Progress Notes (Signed)
Pt returning to MAU after leaving without being seen.  Had a family issue.  Has continued to leak since leaving.  Denies contractions.

## 2011-12-12 NOTE — Progress Notes (Signed)
Pt states at 2145 she had a gush of clear watery fluid-states it contines to  Make her feel wet

## 2011-12-13 ENCOUNTER — Encounter (HOSPITAL_COMMUNITY): Payer: Self-pay | Admitting: *Deleted

## 2011-12-13 ENCOUNTER — Encounter: Payer: Medicaid Other | Admitting: Physical Therapy

## 2011-12-13 ENCOUNTER — Inpatient Hospital Stay (HOSPITAL_COMMUNITY): Payer: Medicaid Other

## 2011-12-13 LAB — AMNISURE RUPTURE OF MEMBRANE (ROM) NOT AT ARMC: Amnisure ROM: NEGATIVE

## 2011-12-13 LAB — GC/CHLAMYDIA PROBE AMP, GENITAL
Chlamydia, DNA Probe: NEGATIVE
GC Probe Amp, Genital: NEGATIVE

## 2011-12-13 LAB — WET PREP, GENITAL: Clue Cells Wet Prep HPF POC: NONE SEEN

## 2011-12-13 NOTE — ED Provider Notes (Signed)
History     CSN: [id number] Arrival date & time: 12/12/2011 11:19 PM   None     Chief Complaint  Patient presents with  . Rupture of Membranes     HPI Valerie Robbins is a 25 y.o. female who presents to MAU for leaking of fluid. She states that she is high risk and with her son she had low fluid that was only discovered with ultrasound. The leaking started earlier this evening and has gotten worse. She denies pain or bleeding.  Past Medical History  Diagnosis Date  . Ectopic pregnancy   . Abnormal Pap smear   . Anxiety   . Depression   . Asthma     Past Surgical History  Procedure Date  . Colposcopy w/ biopsy / curettage     Family History  Problem Relation Age of Onset  . Hypertension Mother   . Diabetes Mother     History  Substance Use Topics  . Smoking status: Never Smoker   . Smokeless tobacco: Never Used  . Alcohol Use: No    OB History    Grav Para Term Preterm Abortions TAB SAB Ect Mult Living   5 2 2  0 2 1 0 1 0 2      Review of Systems  Genitourinary: Positive for vaginal discharge.       Pregnant   Psychiatric/Behavioral: The patient is nervous/anxious.   All other systems reviewed and are negative.    Allergies  Review of patient's allergies indicates no known allergies.  Home Medications  No current outpatient prescriptions on file.  BP 118/66  Pulse 85  Resp 18  Ht 5\' 5"  (1.651 m)  Wt 201 lb (91.173 kg)  BMI 33.45 kg/m2  LMP 06/03/2011  Physical Exam  Nursing note and vitals reviewed. Constitutional: She is oriented to person, place, and time. She appears well-developed and well-nourished.  HENT:  Head: Normocephalic.  Eyes: EOM are normal.  Neck: Neck supple.  Cardiovascular: Normal rate.   Pulmonary/Chest: Effort normal.  Abdominal: Soft. There is no tenderness.       Gravid consistent with dates.  Genitourinary:       External genitalia without lesions. Watery white discharge vaginal vault. Cervix closed and  thick. Uterus consistent with dates.  Musculoskeletal: Normal range of motion.  Neurological: She is alert and oriented to person, place, and time. No cranial nerve deficit.  Skin: Skin is warm and dry.  Psychiatric: Her behavior is normal. Judgment and thought content normal. Her mood appears anxious.   Results for orders placed during the hospital encounter of 12/12/11 (from the past 24 hour(s))  AMNISURE RUPTURE OF MEMBRANE (ROM)     Status: Normal   Collection Time   12/13/11 12:00 AM      Component Value Range   Amnisure ROM NEGATIVE    WET PREP, GENITAL     Status: Abnormal   Collection Time   12/13/11 12:00 AM      Component Value Range   Yeast, Wet Prep NONE SEEN  NONE SEEN    Trich, Wet Prep NONE SEEN  NONE SEEN    Clue Cells, Wet Prep NONE SEEN  NONE SEEN    WBC, Wet Prep HPF POC MODERATE (*) NONE SEEN    Assessment: Vaginal discharge @ 27.[redacted] weeks gestation  Plan:  Ultrasound shows normal fluid volume   Consult with Dr. Clearance Coots   Patient may be discharged home to follow up in the office  ED Course: EFM  reactive tracing no contractions.  Procedures   MDM          Kerrie Buffalo, NP 12/13/11 [phone]

## 2011-12-20 ENCOUNTER — Encounter: Payer: Medicaid Other | Admitting: Physical Therapy

## 2011-12-21 ENCOUNTER — Encounter: Payer: Medicaid Other | Admitting: Physical Therapy

## 2011-12-25 NOTE — L&D Delivery Note (Signed)
Delivery Note At 12:47 PM a viable female was delivered via  (Presentation: ;  ).  APGAR: , ; weight .   Placenta status: , .  Cord:  with the following complications: .  Cord pH: not done  Anesthesia:   Episiotomy:  Lacerations:  Suture Repair: 2.0 Est. Blood Loss (mL):   Mom to postpartum.  Baby to nursery-stable.  Jalicia Roszak A 03/02/2012, 1:08 PM

## 2012-01-02 ENCOUNTER — Ambulatory Visit (HOSPITAL_COMMUNITY)
Admission: RE | Admit: 2012-01-02 | Discharge: 2012-01-02 | Disposition: A | Discharge status: Home / Self Care | Payer: Medicaid Other | Source: Ambulatory Visit | Attending: Obstetrics & Gynecology | Admitting: Obstetrics & Gynecology

## 2012-01-02 DIAGNOSIS — O280 Abnormal hematological finding on antenatal screening of mother: Secondary | ICD-10-CM

## 2012-01-02 DIAGNOSIS — Z3689 Encounter for other specified antenatal screening: Secondary | ICD-10-CM | POA: Insufficient documentation

## 2012-01-02 NOTE — Progress Notes (Signed)
Obstetric ultrasound performed today.  Please see report in [facility]. 

## 2012-01-03 ENCOUNTER — Ambulatory Visit (HOSPITAL_COMMUNITY): Payer: Medicaid Other

## 2012-01-22 ENCOUNTER — Ambulatory Visit (INDEPENDENT_AMBULATORY_CARE_PROVIDER_SITE_OTHER): Payer: Medicaid Other | Admitting: Emergency Medicine

## 2012-01-22 ENCOUNTER — Encounter: Payer: Self-pay | Admitting: Emergency Medicine

## 2012-01-22 VITALS — BP 110/73 | HR 71 | Ht 65.0 in | Wt 206.5 lb

## 2012-01-22 DIAGNOSIS — F32A Depression, unspecified: Secondary | ICD-10-CM | POA: Insufficient documentation

## 2012-01-22 DIAGNOSIS — K141 Geographic tongue: Secondary | ICD-10-CM

## 2012-01-22 DIAGNOSIS — F329 Major depressive disorder, single episode, unspecified: Secondary | ICD-10-CM

## 2012-01-22 DIAGNOSIS — Z0000 Encounter for general adult medical examination without abnormal findings: Secondary | ICD-10-CM | POA: Insufficient documentation

## 2012-01-22 MED ORDER — LIDOCAINE VISCOUS 2 % MT SOLN: 5.0000 mL | OROMUCOSAL | Status: AC | PRN

## 2012-01-22 NOTE — Assessment & Plan Note (Signed)
Reviewed history and medications.  Health maintenance is up to day with pap smear in 2012.

## 2012-01-22 NOTE — Assessment & Plan Note (Signed)
Currently in a flare.  Patient does have concerns as this flare is different from those in the past.  Decided to watch and wait until after pregnancy.  If continues to have worsening flares, will consider referral to ENT.  Prescribed viscous lidocaine to use if tongue causes discomfort.

## 2012-01-22 NOTE — Patient Instructions (Signed)
It was nice to meet you.  Your tongue looks healthy despite the small ulcers.  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  Given that your are currently pregnancy, we decided to take a watch and see approach.  If it continues to hang around after your pregnancy, come back to be re-evaluated and we can consider a referral to an ENT specialist at that time.  I sent a prescription for viscous lidocaine to your pharmacy - swish it around your mouth and spit it out if your tongue is causing discomfort.  I will see you back after you deliver if needed, otherwise, I'll see you in a year for your annual exam.

## 2012-01-22 NOTE — Progress Notes (Signed)
  Subjective:    Patient ID: Valerie Robbins, female    DOB: 1986-03-26, 26 y.o.   MRN: [id number]  HPI Valerie Robbins is here today to establish care and address tongue concerns.  1. Establish care: I have reviewed and updated the following as appropriate: allergies, current medications, past family history, past medical history, past social history, past surgical history and problem list.  -Depression - since 2007 when she saw her partner die.  Is seeing a counselor and is currently on Wellbutrin during her pregnancy.  -Pregnancy - followed by OB; no concerns currently  2. Tongue concerns: Has had recurrent lesions on her tongue since 2007.  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  States it used to come and go, but she is currently in a flare that has been present for about 3 weeks.  States the lesions are more on the side and underside of her tongue than before, is worried about possible cancer or infection.  Does not take any medication for this.   Review of Systems See  HPI, otherwise negative    Objective:   Physical Exam Vitals: reviewed General: pregnant woman, healthy, alert, cooperative, pleasant HEENT: AT/[place], sclera white, EOMIs, PERRL, no pharyngeal erythema or exudate, no pharyngeal or buccal lesions, tongue with multiple shallow ulcerations located on dorsum and sides of tongue Neck: no LAD, trachea midline CV: RRR, no murmurs Pulm: CTAB, no wheezes, rales Abd: gravid     Assessment & Plan:

## 2012-02-05 ENCOUNTER — Encounter (HOSPITAL_COMMUNITY): Payer: Self-pay | Admitting: *Deleted

## 2012-02-05 ENCOUNTER — Inpatient Hospital Stay (HOSPITAL_COMMUNITY)
Admission: AD | Admit: 2012-02-05 | Discharge: 2012-02-05 | Disposition: A | Discharge status: Home / Self Care | Payer: Medicaid Other | Source: Ambulatory Visit | Attending: Obstetrics & Gynecology | Admitting: Obstetrics & Gynecology

## 2012-02-05 DIAGNOSIS — N949 Unspecified condition associated with female genital organs and menstrual cycle: Secondary | ICD-10-CM

## 2012-02-05 DIAGNOSIS — R109 Unspecified abdominal pain: Secondary | ICD-10-CM | POA: Insufficient documentation

## 2012-02-05 DIAGNOSIS — O479 False labor, unspecified: Secondary | ICD-10-CM | POA: Insufficient documentation

## 2012-02-05 NOTE — Discharge Instructions (Signed)
Normal Labor and Delivery °Your caregiver must first be sure you are in labor. Signs of labor include: °· You may pass what is called "the mucus plug" before labor begins. This is a small amount of blood stained mucus.  °· Regular uterine contractions.  °· The time between contractions get closer together.  °· The discomfort and pain gradually gets more intense.  °· Pains are mostly located in the back.  °· Pains get worse when walking.  °· The cervix (the opening of the uterus becomes thinner (begins to efface) and opens up (dilates).  °Once you are in labor and admitted into the hospital or care center, your caregiver will do the following: °· A complete physical examination.  °· Check your vital signs (blood pressure, pulse, temperature and the fetal heart rate).  °· Do a vaginal examination (using a sterile glove and lubricant) to determine:  °· The position (presentation) of the baby (head [vertex] or buttock first).  °· The level (station) of the baby's head in the birth canal.  °· The effacement and dilatation of the cervix.  °· You may have your pubic hair shaved and be given an enema depending on your caregiver and the circumstance.  °· An electronic monitor is usually placed on your abdomen. The monitor follows the length and intensity of the contractions, as well as the baby's heart rate.  °· Usually, your caregiver will insert an IV in your arm with a bottle of sugar water. This is done as a precaution so that medications can be given to you quickly during labor or delivery.  °NORMAL LABOR AND DELIVERY IS DIVIDED UP INTO 3 STAGES: °First Stage °This is when regular contractions begin and the cervix begins to efface and dilate. This stage can last from 3 to 15 hours. The end of the first stage is when the cervix is 100% effaced and 10 centimeters dilated. Pain medications may be given by  °· Injection (morphine, demerol, etc.)  °· Regional anesthesia (spinal, caudal or epidural, anesthetics given in  different locations of the spine). Paracervical pain medication may be given, which is an injection of and anesthetic on each side of the cervix.  °A pregnant woman may request to have "Natural Childbirth" which is not to have any medications or anesthesia during her labor and delivery. °Second Stage °This is when the baby comes down through the birth canal (vagina) and is born. This can take 1 to 4 hours. As the baby's head comes down through the birth canal, you may feel like you are going to have a bowel movement. You will get the urge to bear down and push until the baby is delivered. As the baby's head is being delivered, the caregiver will decide if an episiotomy (a cut in the perineum and vagina area) is needed to prevent tearing of the tissue in this area. The episiotomy is sewn up after the delivery of the baby and placenta. Sometimes a mask with nitrous oxide is given for the mother to breath during the delivery of the baby to help if there is too much pain. The end of Stage 2 is when the baby is fully delivered. Then when the umbilical cord stops pulsating it is clamped and cut. °Third Stage °The third stage begins after the baby is completely delivered and ends after the placenta (afterbirth) is delivered. This usually takes 5 to 30 minutes. After the placenta is delivered, a medication is given either by intravenous or injection to help contract   the uterus and prevent bleeding. The third stage is not painful and pain medication is usually not necessary. If an episiotomy was done, it is repaired at this time. °After the delivery, the mother is watched and monitored closely for 1 to 2 hours to make sure there is no postpartum bleeding (hemorrhage). If there is a lot of bleeding, medication is given to contract the uterus and stop the bleeding. °Document Released: 09/18/2008 Document Revised: 08/22/2011 Document Reviewed: 09/18/2008 °[employer]® Patient Information ©2012 [employer]. °

## 2012-02-05 NOTE — ED Provider Notes (Signed)
History     Chief Complaint  Patient presents with  . Labor Eval   HPI This is a 26 y.o. female at [redacted]w[redacted]d who presents with c/o losing her mucous plug last week and back and pelvic soreness for a week. States baby was not moving much today. No leaking or bleeding. Had UCs at home, none here.  Requesting [place] "because I am high risk and she gets [place] all the time to check the growth and amniotic fluid".  Does not know why she is high risk.   OB History    Grav Para Term Preterm Abortions TAB SAB Ect Mult Living   5 2 2  0 2 1 0 1 0 2      Past Medical History  Diagnosis Date  . Ectopic pregnancy   . Abnormal Pap smear   . Anxiety   . Depression   . Asthma   Hx HSV  Past Surgical History  Procedure Date  . Colposcopy w/ biopsy / curettage     Family History  Problem Relation Age of Onset  . Hypertension Mother   . Depression Mother   . Asthma Father   . Alcohol abuse Father     History  Substance Use Topics  . Smoking status: Never Smoker   . Smokeless tobacco: Never Used  . Alcohol Use: No     social alcohol when not pregnant    Allergies: No Known Allergies  Prescriptions prior to admission  Medication Sig Dispense Refill  . HYDROcodone-acetaminophen (VICODIN ES) 7.5-750 MG per tablet Take 1 tablet by mouth every 6 (six) hours as needed. For pain      . Prenatal Vit-Fe Fumarate-FA (PRENATAL MULTIVITAMIN) TABS Take 1 tablet by mouth daily.        ROS As above.  Physical Exam   Blood pressure 119/64, pulse 82, temperature 98.3 F (36.8 C), temperature source Oral, resp. rate 18, height 5\' 5"  (1.651 m), weight 195 lb (88.451 kg), last menstrual period 06/03/2011.  Physical Exam  Constitutional: She is oriented to person, place, and time. She appears well-developed and well-nourished.  HENT:  Head: Normocephalic.  Cardiovascular: Normal rate.   Respiratory: Effort normal.  GI: Soft. She exhibits no distension and no mass. There is no tenderness. There is no  rebound and no guarding.  Genitourinary: Vagina normal and uterus normal. No vaginal discharge found.       Cervix long/closed/ballot NST reactive with no contractions  Musculoskeletal: Normal range of motion.  Neurological: She is alert and oriented to person, place, and time.  Skin: Skin is warm and dry.  Psychiatric: She has a normal mood and affect.    MAU Course  Procedures  Assessment and Plan  A:  IUP at [redacted]w[redacted]d      Somatic complaints      Reassuring fetal status      Not in labor  P:  Dr Clearance Coots called, recommends reassurance and D/C home.       Will f/u with Dr Holley Dexter for [place] if necessary via office       Labor precautions       By the time I got off phone with Dr Clearance Coots, pt was dressed in hallway asking to leave for FOBs appt.  [facility] 02/05/2012, 12:07 PM

## 2012-02-05 NOTE — Progress Notes (Signed)
C/o losing her mucous plug 3 days ago; increased pain this AM with her braxton-hicks;

## 2012-02-13 ENCOUNTER — Ambulatory Visit (HOSPITAL_COMMUNITY)
Admission: RE | Admit: 2012-02-13 | Discharge: 2012-02-13 | Disposition: A | Discharge status: Home / Self Care | Payer: Medicaid Other | Source: Ambulatory Visit | Attending: Maternal and Fetal Medicine | Admitting: Maternal and Fetal Medicine

## 2012-02-13 ENCOUNTER — Ambulatory Visit (HOSPITAL_COMMUNITY)
Admission: RE | Admit: 2012-02-13 | Payer: Medicaid Other | Source: Ambulatory Visit | Attending: Maternal and Fetal Medicine | Admitting: Maternal and Fetal Medicine

## 2012-02-13 DIAGNOSIS — O280 Abnormal hematological finding on antenatal screening of mother: Secondary | ICD-10-CM

## 2012-02-13 DIAGNOSIS — Z3689 Encounter for other specified antenatal screening: Secondary | ICD-10-CM | POA: Insufficient documentation

## 2012-03-02 ENCOUNTER — Inpatient Hospital Stay (HOSPITAL_COMMUNITY)
Admission: AD | Admit: 2012-03-02 | Discharge: 2012-03-04 | DRG: 775 | Disposition: A | Discharge status: Home Health | Payer: Medicaid Other | Source: Ambulatory Visit | Attending: Obstetrics & Gynecology | Admitting: Obstetrics & Gynecology

## 2012-03-02 ENCOUNTER — Encounter (HOSPITAL_COMMUNITY): Payer: Self-pay | Admitting: Anesthesiology

## 2012-03-02 ENCOUNTER — Encounter (HOSPITAL_COMMUNITY): Payer: Self-pay | Admitting: *Deleted

## 2012-03-02 ENCOUNTER — Inpatient Hospital Stay (HOSPITAL_COMMUNITY): Payer: Medicaid Other | Admitting: Anesthesiology

## 2012-03-02 LAB — CBC
MCV: 93.3 fL (ref 78.0–100.0)
Platelets: 241 10*3/uL (ref 150–400)
RDW: 14.2 % (ref 11.5–15.5)
WBC: 6.5 10*3/uL (ref 4.0–10.5)

## 2012-03-02 MED ORDER — OXYCODONE-ACETAMINOPHEN 5-325 MG PO TABS
2.0000 | ORAL_TABLET | Freq: Once | ORAL | Status: AC
Start: 2012-03-02 — End: 2012-03-02
  Administered 2012-03-02: 2 via ORAL
  Filled 2012-03-02: qty 2

## 2012-03-02 MED ORDER — LIDOCAINE HCL (PF) 1 % IJ SOLN: 30.0000 mL | INTRAMUSCULAR | Status: DC | PRN

## 2012-03-02 MED ORDER — FLEET ENEMA 7-19 GM/118ML RE ENEM: 1.0000 | ENEMA | RECTAL | Status: DC | PRN

## 2012-03-02 MED ORDER — LACTATED RINGERS IV SOLN: 500.0000 mL | INTRAVENOUS | Status: DC | PRN

## 2012-03-02 MED ORDER — ONDANSETRON HCL 4 MG/2ML IJ SOLN: 4.0000 mg | Freq: Four times a day (QID) | INTRAMUSCULAR | Status: DC | PRN

## 2012-03-02 MED ORDER — EPHEDRINE 5 MG/ML INJ
10.0000 mg | INTRAVENOUS | Status: DC | PRN
  Filled 2012-03-02: qty 4

## 2012-03-02 MED ORDER — IBUPROFEN 600 MG PO TABS
600.0000 mg | ORAL_TABLET | Freq: Four times a day (QID) | ORAL | Status: DC | PRN
  Administered 2012-03-02: 600 mg via ORAL
  Filled 2012-03-02: qty 1

## 2012-03-02 MED ORDER — OXYCODONE-ACETAMINOPHEN 5-325 MG PO TABS: 1.0000 | ORAL_TABLET | ORAL | Status: DC | PRN

## 2012-03-02 MED ORDER — ZOLPIDEM TARTRATE 5 MG PO TABS: 5.0000 mg | ORAL_TABLET | Freq: Every evening | ORAL | Status: DC | PRN

## 2012-03-02 MED ORDER — OXYTOCIN BOLUS FROM INFUSION
500.0000 mL | Freq: Once | INTRAVENOUS | Status: DC
  Filled 2012-03-02: qty 500

## 2012-03-02 MED ORDER — OXYTOCIN BOLUS FROM INFUSION
500.0000 mL | Freq: Once | INTRAVENOUS | Status: DC
  Filled 2012-03-02: qty 500
  Filled 2012-03-02: qty 1000

## 2012-03-02 MED ORDER — TETANUS-DIPHTH-ACELL PERTUSSIS 5-2.5-18.5 LF-MCG/0.5 IM SUSP: 0.5000 mL | Freq: Once | INTRAMUSCULAR | Status: DC

## 2012-03-02 MED ORDER — DIBUCAINE 1 % RE OINT: 1.0000 "application " | TOPICAL_OINTMENT | RECTAL | Status: DC | PRN

## 2012-03-02 MED ORDER — OXYTOCIN 20 UNITS IN LACTATED RINGERS INFUSION - SIMPLE: 125.0000 mL/h | Freq: Once | INTRAVENOUS | Status: DC

## 2012-03-02 MED ORDER — ACETAMINOPHEN 325 MG PO TABS: 650.0000 mg | ORAL_TABLET | ORAL | Status: DC | PRN

## 2012-03-02 MED ORDER — IBUPROFEN 600 MG PO TABS: 600.0000 mg | ORAL_TABLET | Freq: Four times a day (QID) | ORAL | Status: DC | PRN

## 2012-03-02 MED ORDER — ONDANSETRON HCL 4 MG/2ML IJ SOLN: 4.0000 mg | INTRAMUSCULAR | Status: DC | PRN

## 2012-03-02 MED ORDER — FERROUS SULFATE 325 (65 FE) MG PO TABS
325.0000 mg | ORAL_TABLET | Freq: Two times a day (BID) | ORAL | Status: DC
  Administered 2012-03-03: 325 mg via ORAL

## 2012-03-02 MED ORDER — PHENYLEPHRINE 40 MCG/ML (10ML) SYRINGE FOR IV PUSH (FOR BLOOD PRESSURE SUPPORT): 80.0000 ug | PREFILLED_SYRINGE | INTRAVENOUS | Status: DC | PRN

## 2012-03-02 MED ORDER — LACTATED RINGERS IV SOLN: INTRAVENOUS | Status: DC

## 2012-03-02 MED ORDER — CITRIC ACID-SODIUM CITRATE 334-500 MG/5ML PO SOLN: 30.0000 mL | ORAL | Status: DC | PRN

## 2012-03-02 MED ORDER — DIPHENHYDRAMINE HCL 50 MG/ML IJ SOLN: 12.5000 mg | INTRAMUSCULAR | Status: DC | PRN

## 2012-03-02 MED ORDER — OXYCODONE-ACETAMINOPHEN 5-325 MG PO TABS
1.0000 | ORAL_TABLET | ORAL | Status: DC | PRN
  Administered 2012-03-02 (×2): 1 via ORAL
  Filled 2012-03-02 (×3): qty 1

## 2012-03-02 MED ORDER — LANOLIN HYDROUS EX OINT: TOPICAL_OINTMENT | CUTANEOUS | Status: DC | PRN

## 2012-03-02 MED ORDER — LIDOCAINE HCL (PF) 1 % IJ SOLN
INTRAMUSCULAR | Status: DC | PRN
  Administered 2012-03-02 (×2): 5 mL

## 2012-03-02 MED ORDER — PHENYLEPHRINE 40 MCG/ML (10ML) SYRINGE FOR IV PUSH (FOR BLOOD PRESSURE SUPPORT)
80.0000 ug | PREFILLED_SYRINGE | INTRAVENOUS | Status: DC | PRN
  Filled 2012-03-02: qty 5

## 2012-03-02 MED ORDER — OXYCODONE-ACETAMINOPHEN 5-325 MG PO TABS
1.0000 | ORAL_TABLET | ORAL | Status: DC | PRN
  Administered 2012-03-03 (×2): 1 via ORAL
  Filled 2012-03-02: qty 1

## 2012-03-02 MED ORDER — BUTORPHANOL TARTRATE 2 MG/ML IJ SOLN
1.0000 mg | INTRAMUSCULAR | Status: DC | PRN
  Administered 2012-03-02: 1 mg via INTRAVENOUS
  Filled 2012-03-02: qty 1

## 2012-03-02 MED ORDER — BENZOCAINE-MENTHOL 20-0.5 % EX AERO: 1.0000 "application " | INHALATION_SPRAY | CUTANEOUS | Status: DC | PRN

## 2012-03-02 MED ORDER — SIMETHICONE 80 MG PO CHEW: 80.0000 mg | CHEWABLE_TABLET | ORAL | Status: DC | PRN

## 2012-03-02 MED ORDER — WITCH HAZEL-GLYCERIN EX PADS: 1.0000 "application " | MEDICATED_PAD | CUTANEOUS | Status: DC | PRN

## 2012-03-02 MED ORDER — FENTANYL 2.5 MCG/ML BUPIVACAINE 1/10 % EPIDURAL INFUSION (WH - ANES)
14.0000 mL/h | INTRAMUSCULAR | Status: DC
  Administered 2012-03-02: 14 mL/h via EPIDURAL
  Filled 2012-03-02: qty 60

## 2012-03-02 MED ORDER — LACTATED RINGERS IV SOLN
500.0000 mL | Freq: Once | INTRAVENOUS | Status: AC
  Administered 2012-03-02: 10:00:00 via INTRAVENOUS

## 2012-03-02 MED ORDER — PRENATAL MULTIVITAMIN CH
1.0000 | ORAL_TABLET | Freq: Every day | ORAL | Status: DC
  Administered 2012-03-03: 1 via ORAL

## 2012-03-02 MED ORDER — OXYTOCIN BOLUS FROM INFUSION: 500.0000 mL | Freq: Once | INTRAVENOUS | Status: DC

## 2012-03-02 MED ORDER — DIPHENHYDRAMINE HCL 25 MG PO CAPS: 25.0000 mg | ORAL_CAPSULE | Freq: Four times a day (QID) | ORAL | Status: DC | PRN

## 2012-03-02 MED ORDER — MENTHOL 3 MG MT LOZG
1.0000 | LOZENGE | OROMUCOSAL | Status: DC | PRN
  Filled 2012-03-02: qty 9

## 2012-03-02 MED ORDER — SENNOSIDES-DOCUSATE SODIUM 8.6-50 MG PO TABS
2.0000 | ORAL_TABLET | Freq: Every day | ORAL | Status: DC
  Administered 2012-03-02: 2 via ORAL

## 2012-03-02 MED ORDER — IBUPROFEN 600 MG PO TABS
600.0000 mg | ORAL_TABLET | Freq: Four times a day (QID) | ORAL | Status: DC
  Administered 2012-03-03 (×2): 600 mg via ORAL
  Filled 2012-03-02 (×2): qty 1

## 2012-03-02 MED ORDER — ONDANSETRON HCL 4 MG PO TABS: 4.0000 mg | ORAL_TABLET | ORAL | Status: DC | PRN

## 2012-03-02 MED ORDER — EPHEDRINE 5 MG/ML INJ: 10.0000 mg | INTRAVENOUS | Status: DC | PRN

## 2012-03-02 MED ORDER — TERBUTALINE SULFATE 1 MG/ML IJ SOLN
INTRAMUSCULAR | Status: AC
  Filled 2012-03-02: qty 1

## 2012-03-02 NOTE — H&P (Signed)
This is Dr. Francoise Ceo dictating the history and physical on  Valerie Robbins draft she's a 26 year old gravida 5 para 202 to was EDC is 03/09/2012 she's [redacted] weeks pregnant she is negative GBS is admitted in labor the cervix was 1 cm 9200% with the vertex at -2 to -3 station amniotomy was performed the fluids clear in the patient's contracting every 2-3 minutes on on Past medical history negative Past surgical history negative Social history negative System review negative Physical exam revealed a well-developed female in labor HEENT negative Lungs clear Heart regular with no murmurs no gallops Breasts negative Abdomen term Pelvic as described above Extremities negative and

## 2012-03-02 NOTE — Progress Notes (Signed)
Pt reports having strong contractions since earlier this morning. Pt denies SROM or bleeding. Reports good fetal movement.

## 2012-03-02 NOTE — Anesthesia Preprocedure Evaluation (Signed)
Anesthesia Evaluation  Patient identified by MRN, date of birth, ID band Patient awake    Reviewed: Allergy & Precautions, H&P , Patient's Chart, lab work & pertinent test results  Airway Mallampati: II TM Distance: >3 FB Neck ROM: full    Dental No notable dental hx.    Pulmonary neg pulmonary ROS, asthma ,  breath sounds clear to auscultation  Pulmonary exam normal       Cardiovascular negative cardio ROS  Rhythm:regular Rate:Normal     Neuro/Psych negative neurological ROS  negative psych ROS   GI/Hepatic negative GI ROS, Neg liver ROS,   Endo/Other  negative endocrine ROS  Renal/GU negative Renal ROS     Musculoskeletal   Abdominal   Peds  Hematology negative hematology ROS (+)   Anesthesia Other Findings   Reproductive/Obstetrics (+) Pregnancy                           Anesthesia Physical Anesthesia Plan  ASA: II  Anesthesia Plan: Epidural   Post-op Pain Management:    Induction:   Airway Management Planned:   Additional Equipment:   Intra-op Plan:   Post-operative Plan:   Informed Consent: I have reviewed the patients History and Physical, chart, labs and discussed the procedure including the risks, benefits and alternatives for the proposed anesthesia with the patient or authorized representative who has indicated his/her understanding and acceptance.     Plan Discussed with:   Anesthesia Plan Comments:         Anesthesia Quick Evaluation  

## 2012-03-02 NOTE — Progress Notes (Signed)
Notified of SVE  And pt request for pain medication. Percocet ordered and pt to walk x 1 hr.

## 2012-03-02 NOTE — Progress Notes (Signed)
Instructed to remain in bed at all times now.  Voiced understanding. 

## 2012-03-02 NOTE — Consult Note (Signed)
Neonatology Note:  Attendance at Delivery:  I was asked to attend this vacuum-assisted vaginal delivery at term due to fetal bradycardia. The mother is a G5P2A2 O pos, GBS neg on Wellbutrin and Valtrex. At delivery, infant vigorous with good spontaneous cry and tone. Needed only minimal bulb suctioning. Ap 9/9. Lungs clear to ausc in DR. To CN to care of Pediatrician.  Shavonne Ambroise, MD  

## 2012-03-02 NOTE — Anesthesia Postprocedure Evaluation (Signed)
  Anesthesia Post-op Note  Patient: Valerie Robbins  Procedure(s) Performed: * No procedures listed *  Patient Location: PACU and Mother/Baby  Anesthesia Type: Epidural  Level of Consciousness: awake, alert  and oriented  Airway and Oxygen Therapy: Patient Spontanous Breathing   Post-op Assessment: Patient's Cardiovascular Status Stable and Respiratory Function Stable  Post-op Vital Signs: stable  Complications: No apparent anesthesia complications

## 2012-03-02 NOTE — Anesthesia Procedure Notes (Signed)
Epidural Patient location during procedure: OB Start time: 03/02/2012 11:01 AM  Staffing Anesthesiologist: Brayton Caves R Performed by: anesthesiologist   Preanesthetic Checklist Completed: patient identified, site marked, surgical consent, pre-op evaluation, timeout performed, IV checked, risks and benefits discussed and monitors and equipment checked  Epidural Patient position: sitting Prep: site prepped and draped and DuraPrep Patient monitoring: continuous pulse ox and blood pressure Approach: midline Injection technique: LOR air and LOR saline  Needle:  Needle type: Tuohy  Needle gauge: 17 G Needle length: 9 cm Needle insertion depth: 7 cm Catheter type: closed end flexible Catheter size: 19 Gauge Catheter at skin depth: 12 cm Test dose: negative  Assessment Events: blood not aspirated, injection not painful, no injection resistance, negative IV test and no paresthesia  Additional Notes Patient identified.  Risk benefits discussed including failed block, incomplete pain control, headache, nerve damage, paralysis, blood pressure changes, nausea, vomiting, reactions to medication both toxic or allergic, and postpartum back pain.  Patient expressed understanding and wished to proceed.  All questions were answered.  Sterile technique used throughout procedure and epidural site dressed with sterile barrier dressing. No paresthesia or other complications noted.The patient did not experience any signs of intravascular injection such as tinnitus or metallic taste in mouth nor signs of intrathecal spread such as rapid motor block. Please see nursing notes for vital signs.

## 2012-03-02 NOTE — Progress Notes (Signed)
Patient ID: Valerie Robbins, female   DOB: [birth year], 26 y.o.   MRN: [id number] : Room at 12:36 PM a fetal heart list and 60 the cervix was 4 cm 100% vertex -1 the cervix was dilated manually when the patient pushed and she became fully dilated +2 station the vacuum applied and in the delivery at 1248 team in attendance no episiotomy or laceration placenta was spontaneous intact and

## 2012-03-02 NOTE — Progress Notes (Signed)
Called station for assistance.

## 2012-03-03 LAB — CBC
HCT: 34.1 % — ABNORMAL LOW (ref 36.0–46.0)
MCV: 94.7 fL (ref 78.0–100.0)
RBC: 3.6 MIL/uL — ABNORMAL LOW (ref 3.87–5.11)
WBC: 7.8 10*3/uL (ref 4.0–10.5)

## 2012-03-03 MED ORDER — MEASLES, MUMPS & RUBELLA VAC ~~LOC~~ INJ
0.5000 mL | INJECTION | Freq: Once | SUBCUTANEOUS | Status: DC
  Filled 2012-03-03: qty 0.5

## 2012-03-03 MED ORDER — TETANUS-DIPHTH-ACELL PERTUSSIS 5-2.5-18.5 LF-MCG/0.5 IM SUSP: 0.5000 mL | Freq: Once | INTRAMUSCULAR | Status: DC

## 2012-03-03 MED ORDER — WITCH HAZEL-GLYCERIN EX PADS: 1.0000 "application " | MEDICATED_PAD | CUTANEOUS | Status: DC | PRN

## 2012-03-03 MED ORDER — MAGNESIUM HYDROXIDE 400 MG/5ML PO SUSP: 30.0000 mL | ORAL | Status: DC | PRN

## 2012-03-03 MED ORDER — LANOLIN HYDROUS EX OINT: TOPICAL_OINTMENT | CUTANEOUS | Status: DC | PRN

## 2012-03-03 MED ORDER — DIPHENHYDRAMINE HCL 25 MG PO CAPS: 25.0000 mg | ORAL_CAPSULE | Freq: Four times a day (QID) | ORAL | Status: DC | PRN

## 2012-03-03 MED ORDER — ZOLPIDEM TARTRATE 5 MG PO TABS: 5.0000 mg | ORAL_TABLET | Freq: Every evening | ORAL | Status: DC | PRN

## 2012-03-03 MED ORDER — FERROUS SULFATE 325 (65 FE) MG PO TABS
325.0000 mg | ORAL_TABLET | Freq: Two times a day (BID) | ORAL | Status: DC
  Administered 2012-03-03 – 2012-03-04 (×2): 325 mg via ORAL
  Filled 2012-03-03 (×2): qty 1

## 2012-03-03 MED ORDER — ONDANSETRON HCL 4 MG PO TABS: 4.0000 mg | ORAL_TABLET | ORAL | Status: DC | PRN

## 2012-03-03 MED ORDER — BENZOCAINE-MENTHOL 20-0.5 % EX AERO
INHALATION_SPRAY | CUTANEOUS | Status: AC
  Filled 2012-03-03: qty 56

## 2012-03-03 MED ORDER — SENNOSIDES-DOCUSATE SODIUM 8.6-50 MG PO TABS
2.0000 | ORAL_TABLET | Freq: Every day | ORAL | Status: DC
Start: 2012-03-03 — End: 2012-03-04
  Administered 2012-03-03: 2 via ORAL

## 2012-03-03 MED ORDER — BENZOCAINE-MENTHOL 20-0.5 % EX AERO
1.0000 "application " | INHALATION_SPRAY | CUTANEOUS | Status: DC | PRN
  Administered 2012-03-03: 1 via TOPICAL

## 2012-03-03 MED ORDER — DIBUCAINE 1 % RE OINT: 1.0000 "application " | TOPICAL_OINTMENT | RECTAL | Status: DC | PRN

## 2012-03-03 MED ORDER — ONDANSETRON HCL 4 MG/2ML IJ SOLN: 4.0000 mg | INTRAMUSCULAR | Status: DC | PRN

## 2012-03-03 MED ORDER — OXYCODONE-ACETAMINOPHEN 5-325 MG PO TABS
1.0000 | ORAL_TABLET | ORAL | Status: DC | PRN
  Administered 2012-03-03 (×2): 1 via ORAL
  Administered 2012-03-03: 2 via ORAL
  Administered 2012-03-04: 1 via ORAL
  Filled 2012-03-03: qty 1
  Filled 2012-03-03: qty 2
  Filled 2012-03-03 (×2): qty 1

## 2012-03-03 MED ORDER — SIMETHICONE 80 MG PO CHEW: 80.0000 mg | CHEWABLE_TABLET | ORAL | Status: DC | PRN

## 2012-03-03 MED ORDER — BENZOCAINE-MENTHOL 20-0.5 % EX AERO: 1.0000 "application " | INHALATION_SPRAY | CUTANEOUS | Status: DC | PRN

## 2012-03-03 MED ORDER — IBUPROFEN 600 MG PO TABS: 600.0000 mg | ORAL_TABLET | Freq: Four times a day (QID) | ORAL | Status: DC

## 2012-03-03 MED ORDER — OXYCODONE-ACETAMINOPHEN 5-325 MG PO TABS: 1.0000 | ORAL_TABLET | ORAL | Status: DC | PRN

## 2012-03-03 MED ORDER — MEDROXYPROGESTERONE ACETATE 150 MG/ML IM SUSP: 150.0000 mg | INTRAMUSCULAR | Status: DC | PRN

## 2012-03-03 MED ORDER — IBUPROFEN 600 MG PO TABS
600.0000 mg | ORAL_TABLET | Freq: Four times a day (QID) | ORAL | Status: DC
  Administered 2012-03-03 – 2012-03-04 (×4): 600 mg via ORAL
  Filled 2012-03-03 (×4): qty 1

## 2012-03-03 MED ORDER — PRENATAL MULTIVITAMIN CH
1.0000 | ORAL_TABLET | Freq: Every day | ORAL | Status: DC
  Administered 2012-03-04: 1 via ORAL
  Filled 2012-03-03: qty 1

## 2012-03-03 MED ORDER — SENNOSIDES-DOCUSATE SODIUM 8.6-50 MG PO TABS: 2.0000 | ORAL_TABLET | Freq: Every day | ORAL | Status: DC

## 2012-03-03 MED ORDER — PRENATAL MULTIVITAMIN CH: 1.0000 | ORAL_TABLET | Freq: Every day | ORAL | Status: DC

## 2012-03-03 MED ORDER — FERROUS SULFATE 325 (65 FE) MG PO TABS: 325.0000 mg | ORAL_TABLET | Freq: Two times a day (BID) | ORAL | Status: DC

## 2012-03-03 NOTE — Progress Notes (Signed)
UR chart review completed.  

## 2012-03-03 NOTE — Progress Notes (Signed)
Post Partum Day 1 Subjective: no complaints  Objective: Blood pressure 121/74, pulse 67, temperature 98.2 F (36.8 C), temperature source Oral, resp. rate 18, height 5\' 5"  (1.651 m), weight 215 lb (97.523 kg), last menstrual period 06/03/2011, SpO2 85.00%, unknown if currently breastfeeding.  Physical Exam:  General: alert and no distress Lochia: appropriate Uterine Fundus: firm Incision: healing well DVT Evaluation: No evidence of DVT seen on physical exam.   Basename 03/03/12 0520 03/02/12 1005  HGB 11.2* 12.7  HCT 34.1* 37.7    Assessment/Plan: Plan for discharge tomorrow   LOS: 1 day   HARPER,CHARLES A 03/03/2012, 8:16 AM

## 2012-03-04 MED ORDER — HYDROCODONE-ACETAMINOPHEN 7.5-750 MG PO TABS: 1.0000 | ORAL_TABLET | Freq: Four times a day (QID) | ORAL | Status: DC | PRN

## 2012-03-04 MED ORDER — PRENATAL MULTIVITAMIN CH: 1.0000 | ORAL_TABLET | Freq: Every morning | ORAL | Status: DC

## 2012-03-04 NOTE — Discharge Summary (Signed)
  Obstetric Discharge Summary Reason for Admission: onset of labor Prenatal Procedures: none Intrapartum Procedures: spontaneous vaginal delivery Postpartum Procedures: none Complications-Operative and Postpartum: none  Hemoglobin  Date Value Range Status  03/03/2012 11.2* 12.0-15.0 (g/dL) Final     HCT  Date Value Range Status  03/03/2012 34.1* 36.0-46.0 (%) Final    Discharge Diagnoses: Term Pregnancy-delivered  Discharge Information: Date: 03/04/2012 Activity: pelvic rest Diet: routine Medications:  Prior to Admission medications   Medication Sig Start Date End Date Taking? Authorizing Provider  HYDROcodone-acetaminophen (VICODIN ES) 7.5-750 MG per tablet Take 1 tablet by mouth every 6 (six) hours as needed. For pain 03/04/12   Antionette Char, MD  Prenatal Vit-Fe Fumarate-FA (PRENATAL MULTIVITAMIN) TABS Take 1 tablet by mouth every morning. 03/04/12   Antionette Char, MD    Condition: stable Instructions: refer to routine discharge instructions Discharge to: home Follow-up Information    Follow up with Antionette Char A, MD. Schedule an appointment as soon as possible for a visit in 2 weeks.   Contact information:   [address] [place] [address] [phone]          Newborn Data: Live born  Information for the patient's newborn:  [place], Boy Tynleigh [[id number]]  female ; APGAR , ; weight ;  Home with mother.  JACKSON-MOORE,Jordi Lacko A 03/04/2012, 8:19 AM

## 2012-03-04 NOTE — Discharge Instructions (Signed)

## 2012-03-04 NOTE — Progress Notes (Signed)
Post Partum Day #2 S/P:spontaneous vaginal  RH status/Rubella reviewed.  Feeding: unknown Subjective: No HA, SOB, CP, F/C, breast symptoms: no. Normal vaginal bleeding, no clots.     Objective:  Blood pressure 101/61, pulse 69, temperature 98.2 F (36.8 C), temperature source Oral, resp. rate 18, height 5\' 5"  (1.651 m), weight 97.523 kg (215 lb), last menstrual period 06/03/2011, SpO2 99.00%, unknown if currently breastfeeding.   Physical Exam:  General: alert Lochia: appropriate Uterine Fundus: firm DVT Evaluation: No evidence of DVT seen on physical exam. Ext: No c/c/e  Basename 03/03/12 0520 03/02/12 1005  HGB 11.2* 12.7  HCT 34.1* 37.7    Assessment/Plan: 26 y.o.  PPD # 2 .  normal postpartum exam Continue current postpartum care D/C home   LOS: 2 days   JACKSON-MOORE,Holden Draughon A 03/04/2012, 8:14 AM

## 2012-05-01 ENCOUNTER — Ambulatory Visit: Payer: Medicaid Other | Admitting: Family Medicine

## 2012-07-04 IMAGING — US US OB FOLLOW-UP
1 series · 14 of 28 positions shown · non-contrast
Comparison: none

[Series 1: us ob follow-up · 0.19mm/px · 14 of 48 slices shown]
[im 2/48]
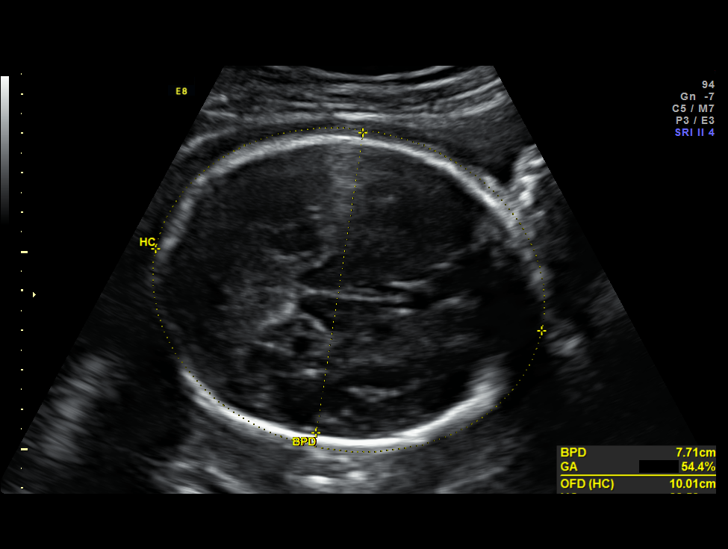
[im 6/48]
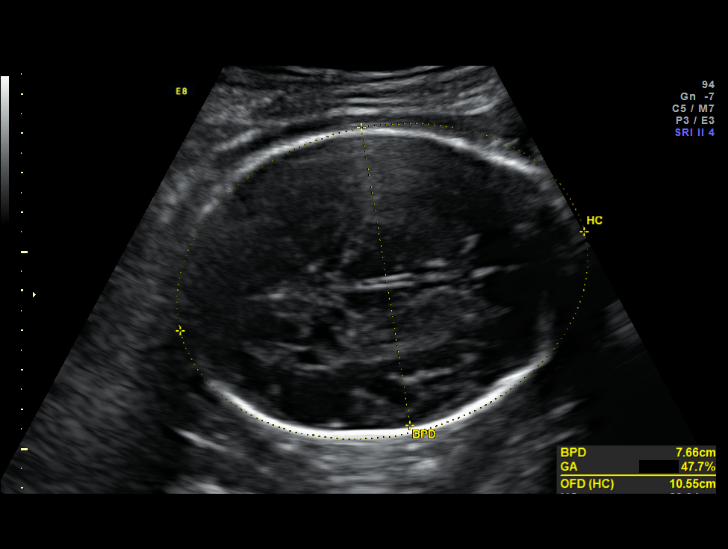
[im 9/48]
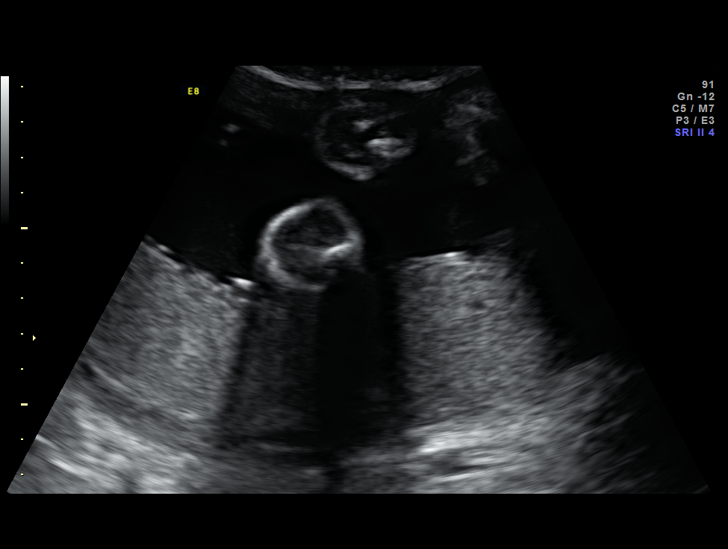
[im 13/48]
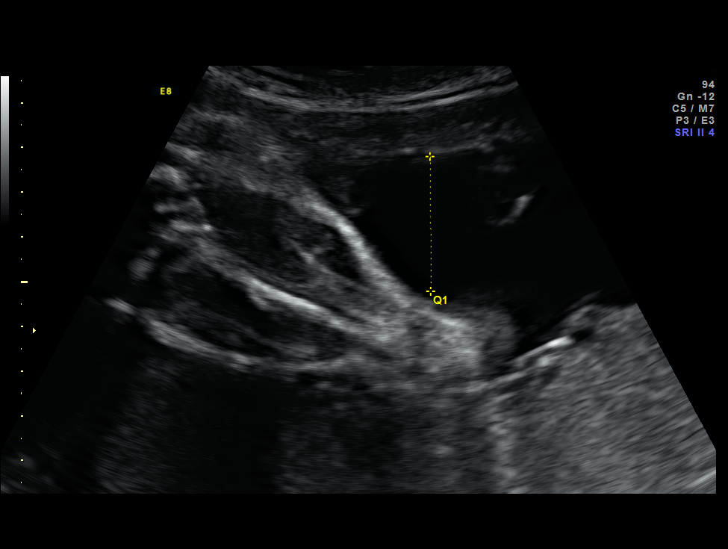
[im 16/48]
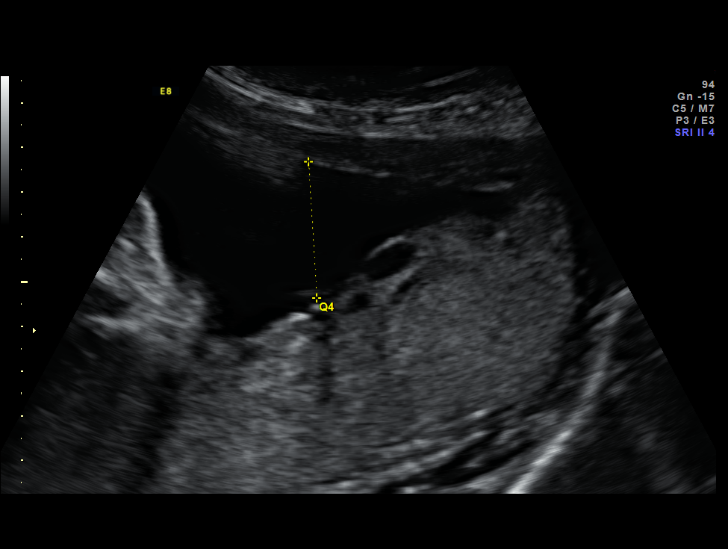
[im 20/48]
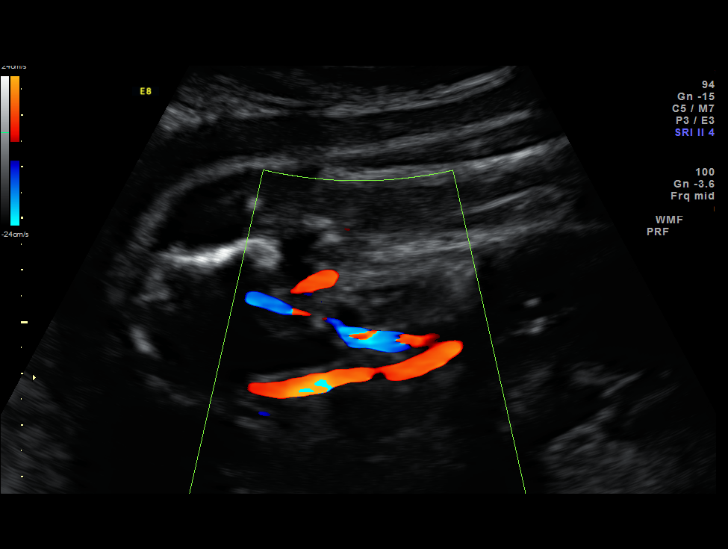
[im 23/48]
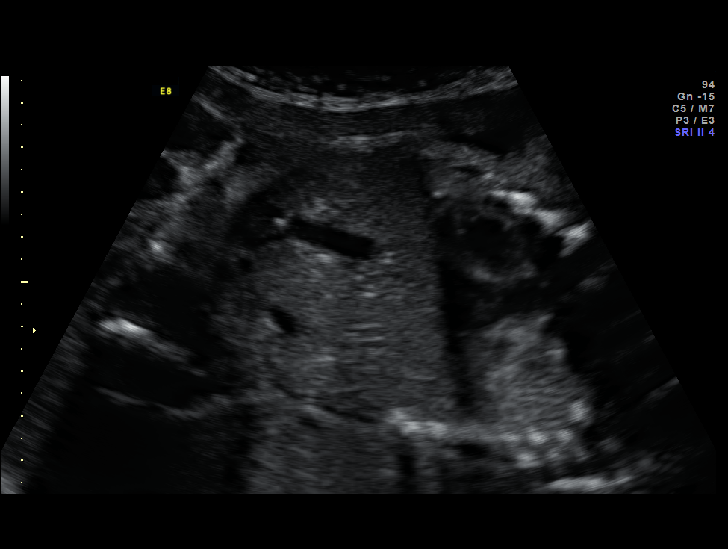
[im 27/48]
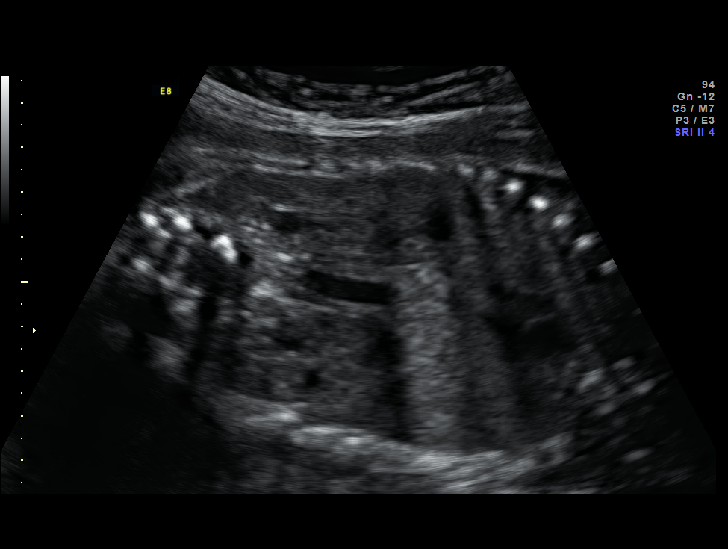
[im 30/48]
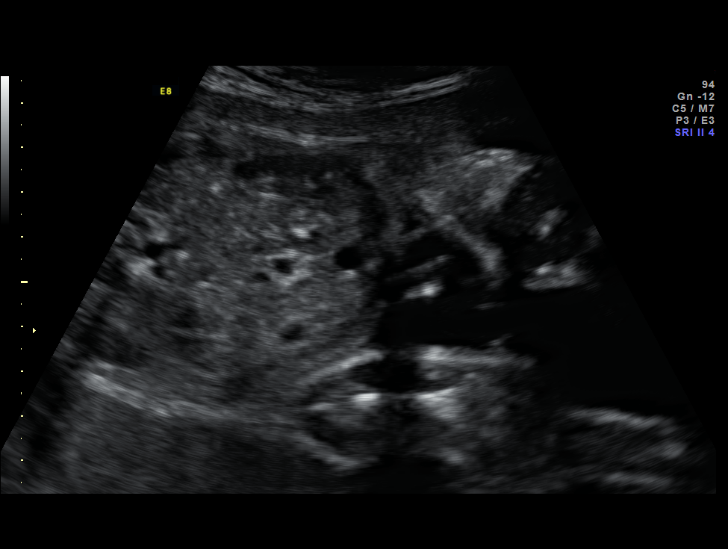
[im 34/48]
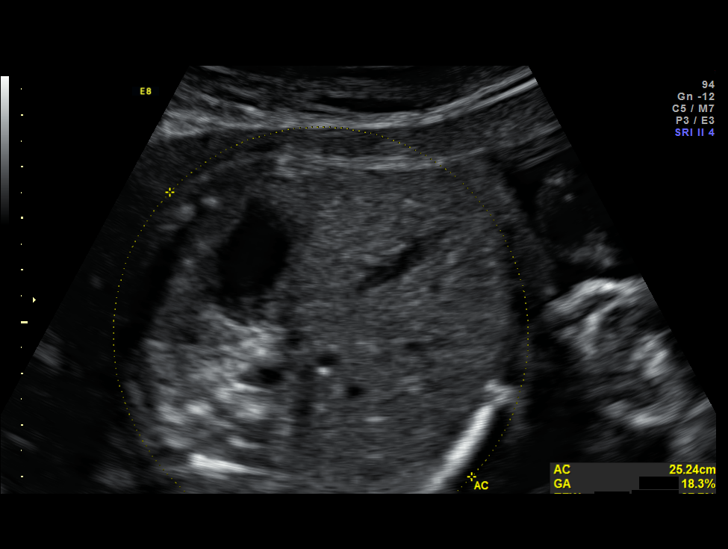
[im 37/48]
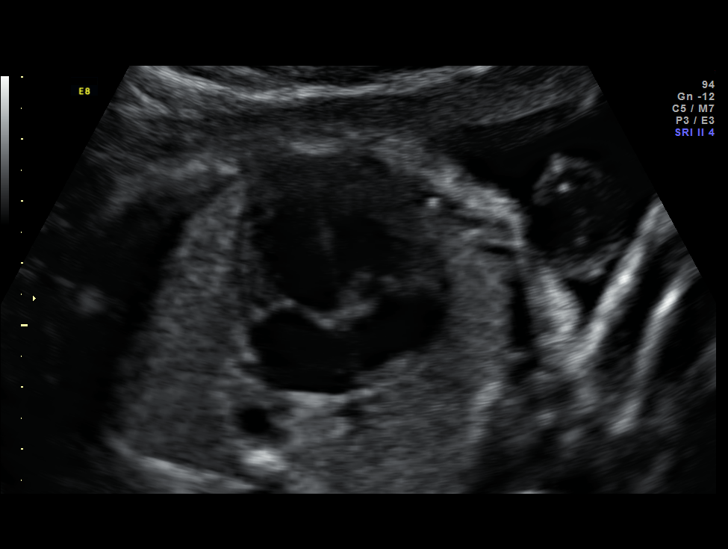
[im 41/48]
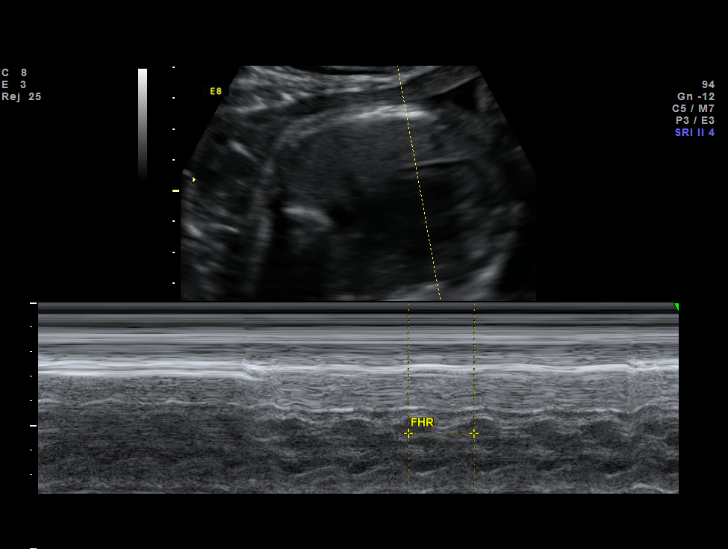
[im 44/48]
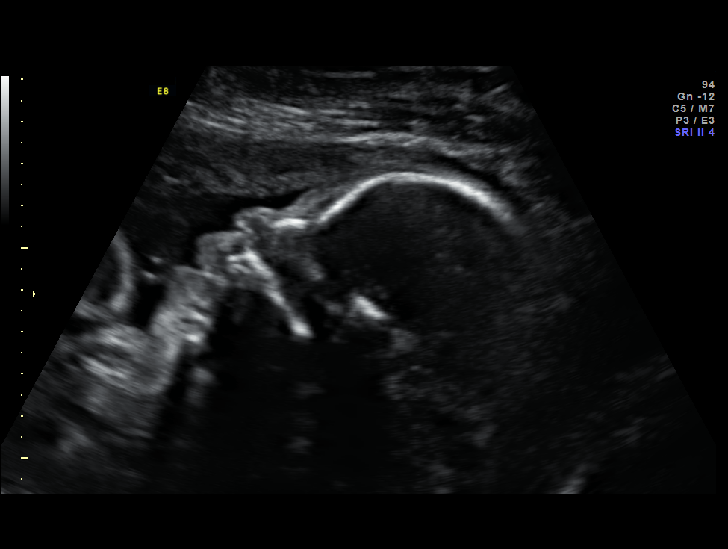
[im 48/48]
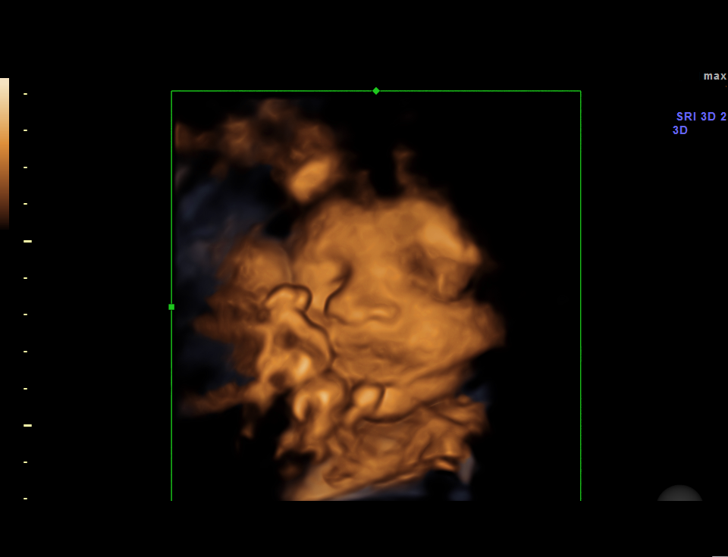

[14 of 28 positions shown; findings below may reference images not displayed]

Canned report from images found in remote index.

Refer to host system for actual result text.

## 2012-08-23 ENCOUNTER — Emergency Department (HOSPITAL_COMMUNITY): Payer: No Typology Code available for payment source

## 2012-08-23 ENCOUNTER — Encounter (HOSPITAL_COMMUNITY): Payer: Self-pay | Admitting: *Deleted

## 2012-08-23 ENCOUNTER — Emergency Department (HOSPITAL_COMMUNITY)
Admission: EM | Admit: 2012-08-23 | Discharge: 2012-08-23 | Disposition: A | Discharge status: Home / Self Care | Payer: No Typology Code available for payment source | Attending: Emergency Medicine | Admitting: Emergency Medicine

## 2012-08-23 DIAGNOSIS — M542 Cervicalgia: Secondary | ICD-10-CM | POA: Insufficient documentation

## 2012-08-23 DIAGNOSIS — M545 Low back pain, unspecified: Secondary | ICD-10-CM | POA: Insufficient documentation

## 2012-08-23 DIAGNOSIS — M79609 Pain in unspecified limb: Secondary | ICD-10-CM | POA: Insufficient documentation

## 2012-08-23 DIAGNOSIS — J45909 Unspecified asthma, uncomplicated: Secondary | ICD-10-CM | POA: Insufficient documentation

## 2012-08-23 DIAGNOSIS — Y92410 Unspecified street and highway as the place of occurrence of the external cause: Secondary | ICD-10-CM | POA: Insufficient documentation

## 2012-08-23 DIAGNOSIS — F341 Dysthymic disorder: Secondary | ICD-10-CM | POA: Insufficient documentation

## 2012-08-23 MED ORDER — IBUPROFEN 800 MG PO TABS
800.0000 mg | ORAL_TABLET | Freq: Once | ORAL | Status: AC
  Administered 2012-08-23: 800 mg via ORAL
  Filled 2012-08-23: qty 1

## 2012-08-23 MED ORDER — HYDROCODONE-ACETAMINOPHEN 5-500 MG PO TABS: 1.0000 | ORAL_TABLET | Freq: Four times a day (QID) | ORAL | Status: DC | PRN

## 2012-08-23 MED ORDER — OXYCODONE-ACETAMINOPHEN 5-325 MG PO TABS
2.0000 | ORAL_TABLET | Freq: Once | ORAL | Status: AC
  Administered 2012-08-23: 2 via ORAL
  Filled 2012-08-23: qty 2

## 2012-08-23 MED ORDER — ONDANSETRON 4 MG PO TBDP
4.0000 mg | ORAL_TABLET | Freq: Once | ORAL | Status: AC
  Administered 2012-08-23: 4 mg via ORAL
  Filled 2012-08-23: qty 1

## 2012-08-23 MED ORDER — CYCLOBENZAPRINE HCL 10 MG PO TABS: 10.0000 mg | ORAL_TABLET | Freq: Two times a day (BID) | ORAL | Status: DC | PRN
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
## 2012-08-23 NOTE — ED Notes (Signed)
Patient transported to X-ray 
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
## 2012-08-23 NOTE — ED Provider Notes (Signed)
Medical screening examination/treatment/procedure(s) were performed by non-physician practitioner and as supervising physician I was immediately available for consultation/collaboration.  Juliet Rude. Rubin Payor, MD 08/23/12 1610

## 2012-08-27 ENCOUNTER — Ambulatory Visit (INDEPENDENT_AMBULATORY_CARE_PROVIDER_SITE_OTHER): Payer: Medicaid Other | Admitting: Family Medicine

## 2012-08-27 VITALS — BP 118/77 | HR 74 | Ht 65.0 in | Wt 169.9 lb

## 2012-08-27 DIAGNOSIS — IMO0001 Reserved for inherently not codable concepts without codable children: Secondary | ICD-10-CM

## 2012-08-27 DIAGNOSIS — M791 Myalgia, unspecified site: Secondary | ICD-10-CM | POA: Insufficient documentation

## 2012-08-27 DIAGNOSIS — R51 Headache: Secondary | ICD-10-CM | POA: Insufficient documentation

## 2012-08-27 DIAGNOSIS — R519 Headache, unspecified: Secondary | ICD-10-CM | POA: Insufficient documentation

## 2012-08-27 MED ORDER — KETOROLAC TROMETHAMINE 10 MG PO TABS: 10.0000 mg | ORAL_TABLET | Freq: Four times a day (QID) | ORAL | Status: DC | PRN
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
## 2012-09-01 ENCOUNTER — Encounter: Payer: Self-pay | Admitting: Family Medicine

## 2012-09-01 ENCOUNTER — Ambulatory Visit (INDEPENDENT_AMBULATORY_CARE_PROVIDER_SITE_OTHER): Payer: Medicaid Other | Admitting: Family Medicine

## 2012-09-01 VITALS — BP 108/69 | HR 82 | Temp 98.0°F | Ht 65.0 in | Wt 171.0 lb

## 2012-09-01 DIAGNOSIS — IMO0001 Reserved for inherently not codable concepts without codable children: Secondary | ICD-10-CM

## 2012-09-01 DIAGNOSIS — M791 Myalgia, unspecified site: Secondary | ICD-10-CM

## 2012-09-01 MED ORDER — METHOCARBAMOL 500 MG PO TABS: 500.0000 mg | ORAL_TABLET | Freq: Four times a day (QID) | ORAL | Status: AC | PRN

## 2012-09-01 MED ORDER — MELOXICAM 15 MG PO TABS: 15.0000 mg | ORAL_TABLET | Freq: Every day | ORAL | Status: DC
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
## 2012-09-01 NOTE — Patient Instructions (Addendum)
We will refer you to physical therapy  Try the muscle relaxant at nighttime (it may make you sleepy)  Take the meloxicam (anti-inflammatory/pain medicine) daily for the next 5 days, then as needed to help take the edge off of the pain   Follow-up with Dr. Ashley Royalty in 2-4 weeks if the pain is not improving or if the pain worsens      What is a muscle strain? - A muscle strain can happen when a muscle gets stretched too much or too quickly, or works too hard. This sometimes makes the muscle tear. Another term for a muscle strain is a "pulled muscle." A muscle strain can happen during an accident or exercise. Muscles that are commonly strained include those in the back, neck, and back of the leg. What are the symptoms of a muscle strain? - Symptoms happen in the area of the muscle strain and can include: Pain  Muscle spasm or tightness  Swelling  Bruising  Weakness or being unable to move the muscle Will I need tests? - Probably not. Your doctor or nurse should be able to tell if you have a muscle strain by learning about your symptoms and doing an exam.  Some people need tests. Depending on your symptoms, your doctor or nurse might order an imaging test such as an ultrasound or MRI scan. Imaging tests create pictures of the inside of the body. How is a muscle strain treated? - A muscle strain usually gets better on its own, but it can take days to weeks to heal completely.  To help your symptoms get better, you can: Rest your muscle and avoid movements or activities that cause pain  Ice the area - You can put a cold gel pack, bag of ice, or bag of frozen vegetables on the painful muscle every 1 to 2 hours, for 15 minutes each time. Put a thin towel between the ice (or other cold object) and your skin. Use the ice (or other cold object) for at least 6 hours after the injury. Some people find it helpful to ice up to 2 days after an injury.  Wrap your muscle with an elastic bandage, other type  of wrap, or fabric "sleeve" (Show Thigh sleeve PI) - This helps support your muscle.  Raise the muscle above the level of your heart (if possible) - For example, you can prop your leg up on pillows. This is helpful only for the first few days after an injury.  Take medicine to reduce the pain and swelling - If you have a lot of pain or a severe muscle strain, your doctor will prescribe a strong pain medicine. If your strain is not severe, you can take an over-the-counter medicine such as acetaminophen (sample brand name: Tylenol), ibuprofen (sample brand names: Advil, Motrin), or naproxen (sample brand names: Aleve, Naprosyn). After your pain gets better, your doctor or nurse will recommend that you gently stretch and exercise your muscle. Stretches and exercises can help strengthen your muscles and keep them from getting too stiff.  Your doctor or nurse will show you stretches and exercises to do. Or he or she will have you work with a physical therapist (exercise expert). It's important to let your muscle heal before you play sports or do other activities that use the muscle again. If you don't let your muscle heal, you are likely to injure it again. Can a muscle strain be prevented? - You can help prevent a muscle strain by taking time to  warm up your muscles before you exercise. You can do this by walking or doing another light activity. If you are not sure how to warm up before exercising, ask your doctor, nurse, or physical therapist.
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
## 2012-09-02 ENCOUNTER — Ambulatory Visit: Payer: No Typology Code available for payment source | Attending: Family Medicine | Admitting: Physical Therapy

## 2012-09-02 DIAGNOSIS — M545 Low back pain, unspecified: Secondary | ICD-10-CM | POA: Insufficient documentation

## 2012-09-02 DIAGNOSIS — IMO0001 Reserved for inherently not codable concepts without codable children: Secondary | ICD-10-CM | POA: Insufficient documentation

## 2012-09-02 DIAGNOSIS — M546 Pain in thoracic spine: Secondary | ICD-10-CM | POA: Insufficient documentation

## 2012-09-02 DIAGNOSIS — M542 Cervicalgia: Secondary | ICD-10-CM | POA: Insufficient documentation

## 2012-09-02 DIAGNOSIS — M2560 Stiffness of unspecified joint, not elsewhere classified: Secondary | ICD-10-CM | POA: Insufficient documentation

## 2012-09-04 ENCOUNTER — Encounter: Payer: Medicaid Other | Admitting: Physical Therapy

## 2012-09-04 ENCOUNTER — Ambulatory Visit: Payer: No Typology Code available for payment source | Admitting: Physical Therapy

## 2012-09-08 ENCOUNTER — Ambulatory Visit: Payer: No Typology Code available for payment source | Admitting: Physical Therapy

## 2012-09-10 ENCOUNTER — Telehealth: Payer: Self-pay | Admitting: Family Medicine

## 2012-09-10 ENCOUNTER — Ambulatory Visit: Payer: No Typology Code available for payment source | Admitting: Physical Therapy
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
## 2012-09-10 NOTE — Telephone Encounter (Signed)
I do not think an excuse for missing 2.5 weeks of school is appropriate given that she was only in physical therapy 3 times per week.  PT sessions generally are not that long so she should have been able to attend some of her classes and she still should have been able to go to school on her off days of physical therapy.
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
## 2012-09-10 NOTE — Telephone Encounter (Signed)
Patient informed of response from Dr. Ashley Royalty.  Patient verbalized understanding and will pick up copy of school excuses given for office visits here on 08/27/12 & 09/01/12.  Gaylene Brooks, RN

## 2012-12-13 ENCOUNTER — Emergency Department (HOSPITAL_COMMUNITY)
Admission: EM | Admit: 2012-12-13 | Discharge: 2012-12-13 | Disposition: A | Discharge status: Home / Self Care | Payer: Medicaid Other | Attending: Emergency Medicine | Admitting: Emergency Medicine

## 2012-12-13 ENCOUNTER — Encounter (HOSPITAL_COMMUNITY): Payer: Self-pay | Admitting: *Deleted

## 2012-12-13 DIAGNOSIS — R52 Pain, unspecified: Secondary | ICD-10-CM | POA: Insufficient documentation

## 2012-12-13 DIAGNOSIS — R109 Unspecified abdominal pain: Secondary | ICD-10-CM | POA: Insufficient documentation

## 2012-12-13 LAB — URINALYSIS, ROUTINE W REFLEX MICROSCOPIC
Ketones, ur: 15 mg/dL — AB
Nitrite: NEGATIVE
Protein, ur: NEGATIVE mg/dL
pH: 6.5 (ref 5.0–8.0)

## 2012-12-13 LAB — RAPID STREP SCREEN (MED CTR MEBANE ONLY): Streptococcus, Group A Screen (Direct): NEGATIVE

## 2012-12-13 LAB — URINE MICROSCOPIC-ADD ON

## 2012-12-13 NOTE — ED Notes (Signed)
Patient complains of sore throat and body aches.  She is also complaining of abd cramps.  Her period is due today.  Patient has family members with recent dx of strep

## 2012-12-16 ENCOUNTER — Emergency Department (HOSPITAL_COMMUNITY)
Admission: EM | Admit: 2012-12-16 | Discharge: 2012-12-16 | Disposition: A | Discharge status: Home / Self Care | Payer: Medicaid Other | Attending: Emergency Medicine | Admitting: Emergency Medicine

## 2012-12-16 ENCOUNTER — Encounter (HOSPITAL_COMMUNITY): Payer: Self-pay | Admitting: Nurse Practitioner

## 2012-12-16 DIAGNOSIS — R509 Fever, unspecified: Secondary | ICD-10-CM | POA: Insufficient documentation

## 2012-12-16 DIAGNOSIS — J069 Acute upper respiratory infection, unspecified: Secondary | ICD-10-CM | POA: Insufficient documentation

## 2012-12-16 DIAGNOSIS — R6889 Other general symptoms and signs: Secondary | ICD-10-CM

## 2012-12-16 DIAGNOSIS — J3489 Other specified disorders of nose and nasal sinuses: Secondary | ICD-10-CM | POA: Insufficient documentation

## 2012-12-16 DIAGNOSIS — IMO0001 Reserved for inherently not codable concepts without codable children: Secondary | ICD-10-CM | POA: Insufficient documentation

## 2012-12-16 DIAGNOSIS — H5789 Other specified disorders of eye and adnexa: Secondary | ICD-10-CM | POA: Insufficient documentation

## 2012-12-16 DIAGNOSIS — M2550 Pain in unspecified joint: Secondary | ICD-10-CM | POA: Insufficient documentation

## 2012-12-16 DIAGNOSIS — J45909 Unspecified asthma, uncomplicated: Secondary | ICD-10-CM | POA: Insufficient documentation

## 2012-12-16 DIAGNOSIS — Z8659 Personal history of other mental and behavioral disorders: Secondary | ICD-10-CM | POA: Insufficient documentation

## 2012-12-16 DIAGNOSIS — R5381 Other malaise: Secondary | ICD-10-CM | POA: Insufficient documentation

## 2012-12-16 MED ORDER — OSELTAMIVIR PHOSPHATE 75 MG PO CAPS: 75.0000 mg | ORAL_CAPSULE | Freq: Two times a day (BID) | ORAL | Status: DC

## 2012-12-16 NOTE — ED Notes (Signed)
Pt reports uri symptoms for past several days: chills, cough. Aches. Took mucinex with no relief. A&Ox4, resp e/u

## 2012-12-16 NOTE — ED Provider Notes (Signed)
Medical screening examination/treatment/procedure(s) were performed by non-physician practitioner and as supervising physician I was immediately available for consultation/collaboration.  Shelda Jakes, MD 12/16/12 0930

## 2012-12-16 NOTE — ED Provider Notes (Signed)
History     CSN: [id number]  Arrival date & time 12/16/12  0904   First MD Initiated Contact with Patient 12/16/12 [phone]      Chief Complaint  Patient presents with  . URI    (Consider location/radiation/quality/duration/timing/severity/associated sxs/prior treatment) HPI Comments: 25 year old female presents to the emergency department complaining of flulike symptoms since Saturday. States she's been feeling hot and cold, body aches, chest congestion and cough. She tried taking Mucinex and TheraFlu without relief. States her symptoms have worsened since Saturday. Her son is sick with similar symptoms. Denies nausea, vomiting or diarrhea. Also states her left eye was red last night, place eyedrops and with relief of her symptoms. Denies eye itching, discharge or pain.  Patient is a 26 y.o. female presenting with URI. The history is provided by the patient and a significant other.  URI The primary symptoms include fever, fatigue, cough, myalgias and arthralgias. Primary symptoms do not include nausea, vomiting or rash.  Symptoms associated with the illness include chills and congestion.    Past Medical History  Diagnosis Date  . Abnormal Pap smear   . Anxiety   . Depression   . Asthma     Past Surgical History  Procedure Date  . Colposcopy w/ biopsy / curettage     Family History  Problem Relation Age of Onset  . Hypertension Mother   . Depression Mother   . Asthma Father   . Alcohol abuse Father   . Hearing loss Sister 0    both parents, sister, grandparents were all born deaf    History  Substance Use Topics  . Smoking status: Never Smoker   . Smokeless tobacco: Never Used  . Alcohol Use: No     Comment: social alcohol when not pregnant    OB History    Grav Para Term Preterm Abortions TAB SAB Ect Mult Living   5 3 3  0 2 0 1 1 0 3      Review of Systems  Constitutional: Positive for fever, chills and fatigue.  HENT: Positive for congestion.   Eyes:  Positive for redness.  Respiratory: Positive for cough.   Cardiovascular: Negative for chest pain.  Gastrointestinal: Negative for nausea, vomiting and diarrhea.  Genitourinary: Negative.   Musculoskeletal: Positive for myalgias and arthralgias.  Skin: Negative for rash.  Neurological: Negative.     Allergies  Review of patient's allergies indicates no known allergies.  Home Medications   Current Outpatient Rx  Name  Route  Sig  Dispense  Refill  . OSELTAMIVIR PHOSPHATE 75 MG PO CAPS   Oral   Take 1 capsule (75 mg total) by mouth every 12 (twelve) hours.   10 capsule   0     BP 142/85  Pulse 82  Temp 98.7 F (37.1 C) (Oral)  Resp 18  SpO2 100%  Physical Exam  Constitutional: She is oriented to person, place, and time. She appears well-developed and well-nourished. No distress.  HENT:  Head: Normocephalic and atraumatic.  Nose: Mucosal edema present.  Mouth/Throat: Uvula is midline and mucous membranes are normal. Posterior oropharyngeal erythema present. No oropharyngeal exudate or posterior oropharyngeal edema.       Clear post nasal drip present.  Eyes: EOM are normal. Pupils are equal, round, and reactive to light. Left eye exhibits no discharge. Left conjunctiva is injected (mild laterally).  Neck: Normal range of motion. Neck supple.  Cardiovascular: Normal rate, regular rhythm and normal heart sounds.   Pulmonary/Chest: Effort normal and  breath sounds normal. She has no wheezes. She has no rhonchi.  Abdominal: Soft. Bowel sounds are normal. There is no tenderness.  Musculoskeletal: Normal range of motion. She exhibits no edema.  Lymphadenopathy:    She has no cervical adenopathy.  Neurological: She is alert and oriented to person, place, and time.  Skin: Skin is warm.  Psychiatric: She has a normal mood and affect. Her behavior is normal.    ED Course  Procedures (including critical care time)  Labs Reviewed - No data to display No results found.   1.  Flu-like symptoms       MDM  27 y/o female with flu-like symptoms since Saturday. Explained Tamiflu is not effective after the first 24-48 hours of symptom onset. Patient then changed her story that symptoms began yesterday and is convinced she needs Tamiflu. I discussed this with Dr. Deretha Emory who said it was ok to give Tamiflu. Explained reasoning behind not giving medication when not necessary to patient but she was not accepting this. No findings on exam concerning bacterial infection.        Trevor Mace, PA-C 12/16/12 [id number]

## 2013-04-22 ENCOUNTER — Ambulatory Visit (INDEPENDENT_AMBULATORY_CARE_PROVIDER_SITE_OTHER): Payer: Medicaid Other | Admitting: Obstetrics

## 2013-04-22 ENCOUNTER — Ambulatory Visit (HOSPITAL_COMMUNITY)
Admission: RE | Admit: 2013-04-22 | Discharge: 2013-04-22 | Disposition: A | Discharge status: Home / Self Care | Payer: Medicaid Other | Source: Ambulatory Visit | Attending: Obstetrics | Admitting: Obstetrics

## 2013-04-22 ENCOUNTER — Encounter: Payer: Self-pay | Admitting: Obstetrics

## 2013-04-22 VITALS — BP 123/82 | HR 72 | Temp 98.3°F | Wt 166.0 lb

## 2013-04-22 DIAGNOSIS — R109 Unspecified abdominal pain: Secondary | ICD-10-CM

## 2013-04-22 DIAGNOSIS — F329 Major depressive disorder, single episode, unspecified: Secondary | ICD-10-CM

## 2013-04-22 DIAGNOSIS — N949 Unspecified condition associated with female genital organs and menstrual cycle: Secondary | ICD-10-CM | POA: Insufficient documentation

## 2013-04-22 DIAGNOSIS — Z113 Encounter for screening for infections with a predominantly sexual mode of transmission: Secondary | ICD-10-CM

## 2013-04-22 LAB — POCT URINALYSIS DIPSTICK
Bilirubin, UA: NEGATIVE
Blood, UA: NEGATIVE
Glucose, UA: NEGATIVE
Spec Grav, UA: 1.02

## 2013-04-22 MED ORDER — HYDROCODONE-IBUPROFEN 5-200 MG PO TABS: 1.0000 | ORAL_TABLET | Freq: Three times a day (TID) | ORAL | Status: DC | PRN

## 2013-04-22 MED ORDER — CITALOPRAM HYDROBROMIDE 20 MG PO TABS: 20.0000 mg | ORAL_TABLET | Freq: Every day | ORAL | Status: DC

## 2013-04-22 NOTE — Progress Notes (Signed)
Subjective:     Valerie Robbins is a 27 y.o. female here for problem visit.  Current complaints: abdominal pain with intermittent vaginal spotting x 3 weeks.  Personal health questionnaire reviewed: yes.   Gynecologic History Patient's last menstrual period was 03/30/2013. Contraception: none  The following portions of the patient's history were reviewed and updated as appropriate: allergies, current medications, past family history, past medical history, past social history, past surgical history and problem list.  Review of Systems Pertinent items are noted in HPI.    Objective:    General appearance: alert and no distress Abdomen: normal findings: soft, non-tender Pelvic: cervix normal in appearance, external genitalia normal, no adnexal masses or tenderness, no cervical motion tenderness, uterus normal size, shape, and consistency and vagina normal without discharge    Assessment:    Healthy female exam.    Plan:    Education reviewed: safe sex/STD prevention and Dysmenorrhea. Ultrasound scheduled.

## 2013-04-23 LAB — GC/CHLAMYDIA PROBE AMP
CT Probe RNA: NEGATIVE
GC Probe RNA: NEGATIVE

## 2013-04-23 LAB — WET PREP BY MOLECULAR PROBE: Candida species: NEGATIVE

## 2013-04-23 NOTE — Patient Instructions (Signed)
Pelvic pain

## 2013-04-24 LAB — URINE CULTURE

## 2013-06-01 ENCOUNTER — Encounter: Payer: Self-pay | Admitting: Obstetrics

## 2013-06-01 ENCOUNTER — Ambulatory Visit (INDEPENDENT_AMBULATORY_CARE_PROVIDER_SITE_OTHER): Payer: Medicaid Other | Admitting: Obstetrics

## 2013-06-01 VITALS — BP 122/80 | HR 77 | Temp 98.5°F | Ht 65.0 in | Wt 171.2 lb

## 2013-06-01 DIAGNOSIS — F341 Dysthymic disorder: Secondary | ICD-10-CM

## 2013-06-01 DIAGNOSIS — F418 Other specified anxiety disorders: Secondary | ICD-10-CM | POA: Insufficient documentation

## 2013-06-01 MED ORDER — ALPRAZOLAM 0.25 MG PO TABS: ORAL_TABLET | ORAL | Status: DC

## 2013-06-01 NOTE — Patient Instructions (Signed)
Management of anxiety/depression.

## 2013-06-01 NOTE — Progress Notes (Signed)
.   Subjective:     Valerie Robbins is a 27 y.o. female here for a problem exam.  Current complaints: headaches, body aches, chest pain and abdominal pains for the last week.  Patient state that she had a panic attack yesterday.    Personal health questionnaire reviewed: yes.   Gynecologic History Patient's last menstrual period was 05/27/2013. Contraception: none Last Pap: 10/2012. Results were: normal Last mammogram: N/A  Obstetric History OB History   Grav Para Term Preterm Abortions TAB SAB Ect Mult Living   5 3 3  0 2 0 1 1 0 3     # Outc Date GA Lbr Len/2nd Wgt Sex Del Anes PTL Lv   1 TRM 3/13 [redacted]w[redacted]d 06:45 / 00:02 7lb8.6oz(3.42kg) M VAC EPI  Yes   Comments: WNL   2 TRM     M SVD  Yes Yes   3 TRM     M SVD   Yes   4 ECT            5 SAB                The following portions of the patient's history were reviewed and updated as appropriate: allergies, current medications, past family history, past medical history, past social history, past surgical history and problem list.  Review of Systems Pertinent items are noted in HPI.    Objective:     Consult only.  Assessment:    Situational depression/anxiety from death of husband.  Stable on Celexa but with high degree of anxiety and panic attacks.   Plan:    Education reviewed: depression evaluation and management of depression/anxiety.. Follow up in: 6 weeks. Xanax Rx   Continue Celexa.

## 2013-07-16 ENCOUNTER — Ambulatory Visit: Payer: Medicaid Other | Admitting: Obstetrics

## 2013-09-01 ENCOUNTER — Encounter: Payer: Self-pay | Admitting: Obstetrics & Gynecology

## 2013-09-04 ENCOUNTER — Encounter: Payer: Self-pay | Admitting: Advanced Practice Midwife

## 2013-09-04 ENCOUNTER — Ambulatory Visit (INDEPENDENT_AMBULATORY_CARE_PROVIDER_SITE_OTHER): Payer: Medicaid Other | Admitting: Advanced Practice Midwife

## 2013-09-04 VITALS — BP 115/79 | HR 76 | Temp 98.5°F | Ht 65.0 in

## 2013-09-04 DIAGNOSIS — Z01419 Encounter for gynecological examination (general) (routine) without abnormal findings: Secondary | ICD-10-CM

## 2013-09-04 DIAGNOSIS — N898 Other specified noninflammatory disorders of vagina: Secondary | ICD-10-CM

## 2013-09-04 DIAGNOSIS — Z113 Encounter for screening for infections with a predominantly sexual mode of transmission: Secondary | ICD-10-CM
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
## 2013-09-06 LAB — URINE CULTURE: Organism ID, Bacteria: NO GROWTH

## 2013-09-07 LAB — PAP IG W/ RFLX HPV ASCU

## 2013-09-10 ENCOUNTER — Ambulatory Visit: Payer: Medicaid Other | Admitting: Obstetrics

## 2013-09-25 ENCOUNTER — Ambulatory Visit (INDEPENDENT_AMBULATORY_CARE_PROVIDER_SITE_OTHER): Payer: Medicaid Other | Admitting: Advanced Practice Midwife

## 2013-09-25 ENCOUNTER — Encounter: Payer: Self-pay | Admitting: Advanced Practice Midwife

## 2013-09-25 VITALS — BP 133/89 | HR 83 | Temp 98.1°F | Ht 65.0 in | Wt 160.0 lb

## 2013-09-25 DIAGNOSIS — N750 Cyst of Bartholin's gland: Secondary | ICD-10-CM | POA: Insufficient documentation

## 2013-09-25 NOTE — Progress Notes (Signed)
.   Subjective:     Valerie Robbins is a 27 y.o. female here for a follow up exam from her last visit regarding the vaginal wall cyst.  She does not think it is there anymore.  No current complaints.  Personal health questionnaire reviewed: no.   Gynecologic History Patient's last menstrual period was 09/10/2013. Contraception: none Last Pap: 2014. Results were: normal  Obstetric History OB History  Gravida Para Term Preterm AB SAB TAB Ectopic Multiple Living  5 3 3  0 2 1 0 1 0 3    # Outcome Date GA Lbr Len/2nd Weight Sex Delivery Anes PTL Lv  5 TRM 03/02/12 [id number] 06:45 / 00:02 7 lb 8.6 oz (3.42 kg) M VAC EPI  Y     Comments: WNL  4 SAB           3 ECT           2 TRM     M SVD   Y  1 TRM     M SVD  Y Y       The following portions of the patient's history were reviewed and updated as appropriate: allergies, current medications, past family history, past medical history, past social history, past surgical history and problem list.  Review of Systems A comprehensive review of systems was negative.    Objective:    Pelvic: cervix normal in appearance, external genitalia normal, rectovaginal septum normal and Bartholins cyst @ 8 o'clock approx. 1cm x1cm    Assessment:   Bartholin's Cyst, Asymptomatic   Plan:    Education reviewed: reviewed what to do w/ patient if cyst becomes problematic.   Reviewed sitz baths, etc. Patient may return PRN, and/or follow up on cyst.  Dory Horn CNM

## 2013-09-28 ENCOUNTER — Encounter: Payer: Self-pay | Admitting: Obstetrics

## 2013-10-08 ENCOUNTER — Telehealth: Payer: Self-pay | Admitting: *Deleted

## 2013-10-08 NOTE — Telephone Encounter (Signed)
Patient called - stated she had a 1 day cycle and wondered if that was normal.  Returned patient call- left message for her per request. Normally variation in a cycle can be caused by different factors- pregnancy if active, stress, medication changes. But would recommend UPT for her piece of mind. If negative / repeat in 2 weeks and if still negative - await next cycle to see if it returns to it's normal course.

## 2013-11-05 ENCOUNTER — Institutional Professional Consult (permissible substitution): Payer: Medicaid Other | Admitting: Obstetrics & Gynecology

## 2013-12-22 ENCOUNTER — Ambulatory Visit: Payer: Medicaid Other | Admitting: Obstetrics

## 2013-12-29 ENCOUNTER — Encounter: Payer: Self-pay | Admitting: *Deleted

## 2013-12-29 ENCOUNTER — Encounter: Payer: Self-pay | Admitting: Obstetrics

## 2013-12-29 ENCOUNTER — Ambulatory Visit (INDEPENDENT_AMBULATORY_CARE_PROVIDER_SITE_OTHER): Payer: Medicaid Other | Admitting: Obstetrics

## 2013-12-29 VITALS — BP 117/76 | HR 73 | Temp 98.2°F | Ht 65.0 in | Wt 176.0 lb

## 2013-12-29 DIAGNOSIS — Z113 Encounter for screening for infections with a predominantly sexual mode of transmission: Secondary | ICD-10-CM | POA: Insufficient documentation

## 2013-12-29 DIAGNOSIS — N760 Acute vaginitis: Secondary | ICD-10-CM

## 2013-12-29 DIAGNOSIS — Z7251 High risk heterosexual behavior: Secondary | ICD-10-CM

## 2013-12-29 LAB — POCT URINALYSIS DIPSTICK
BILIRUBIN UA: NEGATIVE
Glucose, UA: NEGATIVE
KETONES UA: NEGATIVE
Leukocytes, UA: NEGATIVE
Nitrite, UA: NEGATIVE
PH UA: 5
RBC UA: 250
SPEC GRAV UA: 1.025
Urobilinogen, UA: NEGATIVE

## 2013-12-29 LAB — POCT URINE PREGNANCY: Preg Test, Ur: POSITIVE

## 2013-12-29 NOTE — Progress Notes (Signed)
Subjective:     Valerie Robbins is a 28 y.o. female here for a routine exam.  Current complaints: follow up. Pt states she would like a pregnancy and STD check.  Personal health questionnaire reviewed: yes.   Gynecologic History Patient's last menstrual period was 12/02/2013. Contraception: none Last Pap: 08/2013. Results were: normal   Obstetric History OB History  Gravida Para Term Preterm AB SAB TAB Ectopic Multiple Living  5 3 3  0 2 1 0 1 0 3    # Outcome Date GA Lbr Len/2nd Weight Sex Delivery Anes PTL Lv  5 TRM 03/02/12 [id number] 06:45 / 00:02 7 lb 8.6 oz (3.42 kg) M VAC EPI  Y     Comments: WNL  4 SAB           3 ECT           2 TRM     M SVD   Y  1 TRM     M SVD  Y Y       The following portions of the patient's history were reviewed and updated as appropriate: allergies, current medications, past family history, past medical history, past social history, past surgical history and problem list.  Review of Systems Pertinent items are noted in HPI.    Objective:    General appearance: alert and no distress Abdomen: normal findings: soft, non-tender Pelvic: cervix normal in appearance, external genitalia normal, no adnexal masses or tenderness, no cervical motion tenderness, rectovaginal septum normal, uterus normal size, shape, and consistency, vagina normal without discharge and lesions.    Assessment:    Healthy female exam.    Plan:    Follow up in: several months.

## 2013-12-30 ENCOUNTER — Encounter: Payer: Self-pay | Admitting: Obstetrics

## 2013-12-30 LAB — WET PREP BY MOLECULAR PROBE
Candida species: NEGATIVE
GARDNERELLA VAGINALIS: NEGATIVE
Trichomonas vaginosis: NEGATIVE

## 2013-12-30 LAB — GC/CHLAMYDIA PROBE AMP
CT Probe RNA: NEGATIVE
GC PROBE AMP APTIMA: NEGATIVE

## 2014-02-01 ENCOUNTER — Telehealth: Payer: Self-pay | Admitting: *Deleted

## 2014-02-01 DIAGNOSIS — B9689 Other specified bacterial agents as the cause of diseases classified elsewhere: Secondary | ICD-10-CM

## 2014-02-01 DIAGNOSIS — N760 Acute vaginitis: Principal | ICD-10-CM

## 2014-02-01 MED ORDER — METRONIDAZOLE 500 MG PO TABS: 500.0000 mg | ORAL_TABLET | Freq: Two times a day (BID) | ORAL | Status: DC

## 2014-02-01 NOTE — Telephone Encounter (Signed)
Patient called c/o BV symptoms- request Rx sent to pharmacy. Rx sent to pharmacy per treatment protocol.

## 2014-02-17 ENCOUNTER — Other Ambulatory Visit: Payer: Self-pay | Admitting: *Deleted

## 2014-02-17 DIAGNOSIS — B009 Herpesviral infection, unspecified: Secondary | ICD-10-CM

## 2014-02-17 MED ORDER — VALACYCLOVIR HCL 500 MG PO TABS: 500.0000 mg | ORAL_TABLET | Freq: Two times a day (BID) | ORAL | Status: DC

## 2014-02-17 MED ORDER — VALACYCLOVIR HCL 500 MG PO TABS
500.0000 mg | ORAL_TABLET | Freq: Two times a day (BID) | ORAL | Status: DC
Start: 2014-02-17 — End: 2014-02-17

## 2014-02-19 ENCOUNTER — Encounter: Payer: Self-pay | Admitting: Obstetrics

## 2014-03-03 ENCOUNTER — Encounter: Payer: Self-pay | Admitting: Obstetrics

## 2014-03-09 ENCOUNTER — Other Ambulatory Visit: Payer: Self-pay | Admitting: *Deleted

## 2014-03-09 DIAGNOSIS — N760 Acute vaginitis: Secondary | ICD-10-CM

## 2014-03-09 MED ORDER — CLINDAMYCIN PHOSPHATE (1 DOSE) 2 % VA CREA: 1.0000 | TOPICAL_CREAM | Freq: Once | VAGINAL | Status: DC

## 2014-03-12 ENCOUNTER — Other Ambulatory Visit: Payer: Self-pay | Admitting: *Deleted

## 2014-03-19 ENCOUNTER — Ambulatory Visit: Payer: Medicaid Other | Admitting: Family Medicine

## 2014-03-23 ENCOUNTER — Ambulatory Visit (INDEPENDENT_AMBULATORY_CARE_PROVIDER_SITE_OTHER): Payer: Medicaid Other | Admitting: Obstetrics

## 2014-03-23 ENCOUNTER — Encounter: Payer: Self-pay | Admitting: Obstetrics

## 2014-03-23 VITALS — BP 137/93 | HR 73 | Temp 97.4°F | Ht 65.0 in | Wt 172.0 lb

## 2014-03-23 DIAGNOSIS — Z7251 High risk heterosexual behavior: Secondary | ICD-10-CM

## 2014-03-23 DIAGNOSIS — N760 Acute vaginitis: Secondary | ICD-10-CM

## 2014-03-23 LAB — POCT URINE PREGNANCY: Preg Test, Ur: NEGATIVE

## 2014-03-23 NOTE — Progress Notes (Signed)
Subjective:     Valerie Robbins is a 28 y.o. female here for a routine exam.  Current complaints: Patient in office for a follow up. Patient states she has recently been treated for BV. Patient states she is currently having some discharge with some slight itching.   Personal health questionnaire reviewed: yes.   Gynecologic History Patient's last menstrual period was 03/18/2014. Contraception: none Last Pap: 08/2013. Results were: normal  Obstetric History OB History  Gravida Para Term Preterm AB SAB TAB Ectopic Multiple Living  5 3 3  0 2 1 0 1 0 3    # Outcome Date GA Lbr Len/2nd Weight Sex Delivery Anes PTL Lv  5 TRM 03/02/12 [id number] 06:45 / 00:02 7 lb 8.6 oz (3.42 kg) M VAC EPI  Y     Comments: WNL  4 SAB           3 ECT           2 TRM     M SVD   Y  1 TRM     M SVD  Y Y       The following portions of the patient's history were reviewed and updated as appropriate: allergies, current medications, past family history, past medical history, past social history, past surgical history and problem list.  Review of Systems Pertinent items are noted in HPI.    Objective:    General appearance: alert and no distress Abdomen: normal findings: soft, non-tender Pelvic: cervix normal in appearance, external genitalia normal, no adnexal masses or tenderness, no cervical motion tenderness, rectovaginal septum normal, uterus normal size, shape, and consistency and vagina with white discharge    50% of 20 min visit spent on counseling and coordination of care.   Assessment:    Healthy female exam.   H/O BV, treated.  Now with probable yeast vaginitis.   Plan:    Education reviewed: safe sex/STD prevention and vaginitis. Diflucan Rx.

## 2014-03-24 LAB — WET PREP BY MOLECULAR PROBE
Candida species: NEGATIVE
Gardnerella vaginalis: POSITIVE — AB
TRICHOMONAS VAG: NEGATIVE

## 2014-03-25 LAB — GC/CHLAMYDIA PROBE AMP
CT Probe RNA: NEGATIVE
GC PROBE AMP APTIMA: NEGATIVE

## 2014-03-26 ENCOUNTER — Other Ambulatory Visit: Payer: Self-pay | Admitting: *Deleted

## 2014-03-26 DIAGNOSIS — N760 Acute vaginitis: Secondary | ICD-10-CM

## 2014-03-26 MED ORDER — FLUCONAZOLE 150 MG PO TABS: 150.0000 mg | ORAL_TABLET | ORAL | Status: DC

## 2014-03-28 ENCOUNTER — Encounter: Payer: Self-pay | Admitting: Obstetrics

## 2014-03-28 MED ORDER — CITRANATAL DHA 27-1 & 250 MG PO MISC: 1.0000 | Freq: Every day | ORAL | Status: DC

## 2014-04-01 ENCOUNTER — Other Ambulatory Visit: Payer: Self-pay | Admitting: *Deleted

## 2014-04-01 DIAGNOSIS — B9689 Other specified bacterial agents as the cause of diseases classified elsewhere: Secondary | ICD-10-CM

## 2014-04-01 DIAGNOSIS — N760 Acute vaginitis: Principal | ICD-10-CM

## 2014-04-01 MED ORDER — METRONIDAZOLE 500 MG PO TABS: 500.0000 mg | ORAL_TABLET | Freq: Two times a day (BID) | ORAL | Status: DC

## 2014-05-01 ENCOUNTER — Encounter (HOSPITAL_COMMUNITY): Payer: Self-pay | Admitting: Emergency Medicine

## 2014-05-01 ENCOUNTER — Emergency Department (INDEPENDENT_AMBULATORY_CARE_PROVIDER_SITE_OTHER)
Admission: EM | Admit: 2014-05-01 | Discharge: 2014-05-01 | Disposition: A | Discharge status: Home / Self Care | Payer: Medicaid Other | Source: Home / Self Care | Attending: Emergency Medicine | Admitting: Emergency Medicine

## 2014-05-01 DIAGNOSIS — H5710 Ocular pain, unspecified eye: Secondary | ICD-10-CM

## 2014-05-01 DIAGNOSIS — H109 Unspecified conjunctivitis: Secondary | ICD-10-CM

## 2014-05-01 MED ORDER — POLYMYXIN B-TRIMETHOPRIM 10000-0.1 UNIT/ML-% OP SOLN: 1.0000 [drp] | OPHTHALMIC | Status: DC

## 2014-05-01 NOTE — Discharge Instructions (Signed)

## 2014-05-01 NOTE — ED Notes (Signed)
Pt reports poss right pink eye onset 2 days Sx include pain that increases w/light and pressure Denies f/v/n/d, cold sx Taking OTC eye drops w/no relief Alert w/no signs of acute distress.

## 2014-05-01 NOTE — ED Provider Notes (Signed)
Medical screening examination/treatment/procedure(s) were performed by non-physician practitioner and as supervising physician I was immediately available for consultation/collaboration.  Kamesha Herne, M.D.  Chloe Flis C Giovanni Biby, MD 05/01/14 1746 

## 2014-05-01 NOTE — ED Provider Notes (Signed)
CSN: [id number]     Arrival date & time 05/01/14  1035 History   First MD Initiated Contact with Patient 05/01/14 1142     Chief Complaint  Patient presents with  . Conjunctivitis   (Consider location/radiation/quality/duration/timing/severity/associated sxs/prior Treatment) HPI Comments: 28 year old female presents complaining of possible pink eye. For 2 days, she has redness of the right eye, eye pain, light sensitivity, and subjective blurry vision. She denies any other symptoms. She's been using over-the-counter eyedrops without relief. She thinks she may have had this once before. She is here with her son who is sick with an ear infection. She also admits to some crusting of her lower eyelid. No other significant past medical history.  Patient is a 28 y.o. female presenting with conjunctivitis.  Conjunctivitis Pertinent negatives include no headaches.    Past Medical History  Diagnosis Date  . Abnormal Pap smear   . Anxiety   . Depression   . Asthma   . History of uterine fibroid    Past Surgical History  Procedure Laterality Date  . Colposcopy w/ biopsy / curettage     Family History  Problem Relation Age of Onset  . Hypertension Mother   . Depression Mother   . Asthma Father   . Alcohol abuse Father   . Hearing loss Sister 0    both parents, sister, grandparents were all born deaf   History  Substance Use Topics  . Smoking status: Never Smoker   . Smokeless tobacco: Never Used  . Alcohol Use: Yes     Comment: social    OB History   Grav Para Term Preterm Abortions TAB SAB Ect Mult Living   5 3 3  0 2 0 1 1 0 3     Review of Systems  Constitutional: Negative for fever.  Eyes: Positive for photophobia, pain, discharge, redness and visual disturbance. Negative for itching.  Gastrointestinal: Negative for nausea and vomiting.  Neurological: Negative for headaches.  All other systems reviewed and are negative.   Allergies  Review of patient's allergies  indicates no known allergies.  Home Medications   Prior to Admission medications   Medication Sig Start Date End Date Taking? Authorizing Provider  fluconazole (DIFLUCAN) 150 MG tablet Take 1 tablet (150 mg total) by mouth every other day. 03/26/14   Brock Badharles A Harper, MD  metroNIDAZOLE (FLAGYL) 500 MG tablet Take 1 tablet (500 mg total) by mouth 2 (two) times daily. 04/01/14   Brock Badharles A Harper, MD  phentermine (ADIPEX-P) 37.5 MG tablet Take 37.5 mg by mouth daily before breakfast.    Historical Provider, MD  Prenat w/o A-FeCbGl-DSS-FA-DHA (CITRANATAL DHA) 27-1 & 250 MG tablet Take 1 tablet by mouth daily.    Brock Badharles A Harper, MD  valACYclovir (VALTREX) 500 MG tablet Take 1 tablet (500 mg total) by mouth 2 (two) times daily. For 3 days 02/17/14   Brock Badharles A Harper, MD   BP 118/75  Pulse 77  Temp(Src) 98.5 F (36.9 C) (Oral)  Resp 18  SpO2 99%  LMP 04/23/2014 Physical Exam  Nursing note and vitals reviewed. Constitutional: She is oriented to person, place, and time. Vital signs are normal. She appears well-developed and well-nourished. No distress.  HENT:  Head: Normocephalic and atraumatic.  Eyes: EOM are normal. Pupils are equal, round, and reactive to light. Lids are everted and swept, no foreign bodies found. Right eye exhibits discharge (very minimal, clear). Left eye exhibits no discharge. Right conjunctiva is injected.  Fundoscopic exam:  The right eye shows no exudate, no hemorrhage and no papilledema. The right eye shows red reflex.  Slit lamp exam:      The right eye shows no corneal abrasion, no corneal flare, no corneal ulcer, no fluorescein uptake and no anterior chamber bulge.  Visual acuity is 20/20 and equal bilaterally  Cardiovascular: Normal rate, regular rhythm and normal heart sounds.   Pulmonary/Chest: Effort normal and breath sounds normal. No respiratory distress.  Lymphadenopathy:    She has no cervical adenopathy.  Neurological: She is alert and oriented to  person, place, and time. She has normal strength. Coordination normal.  Skin: Skin is warm and dry. No rash noted. She is not diaphoretic.  Psychiatric: She has a normal mood and affect. Judgment normal.    ED Course  Procedures (including critical care time) Labs Review Labs Reviewed - No data to display  Imaging Review No results found.  IOP 10   MDM   1. Conjunctivitis   2. Eye pain    We'll treat for conjunctivitis with antibiotic eyedrops. She has an established relationship with an optometrist, she will followup on Monday she has no improvement. Emergency department if acutely worsening pain or blurry vision.   Meds ordered this encounter  Medications  . trimethoprim-polymyxin b (POLYTRIM) ophthalmic solution    Sig: Place 1 drop into the right eye every 3 (three) hours. While awake, up to 6 doses per day    Dispense:  10 mL    Refill:  0    Order Specific Question:  Supervising Provider    Answer:  Lorenz CoasterKELLER, DAVID C [[id number]]       Graylon GoodZachary H Sharnice Bosler, PA-C 05/01/14 1154

## 2014-05-06 ENCOUNTER — Telehealth: Payer: Self-pay | Admitting: *Deleted

## 2014-05-06 NOTE — Telephone Encounter (Signed)
Patient called to update doctor of her status. Patient states she is under increased stress at work that is effecting her ability to deal with her PTSD. 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 It is becoming increasingly difficult to deal with this on a daily basis. Patient is going to counsiling, but believes she needs to see a pshyciatrist. Patient states her home life is stressful due to her son's ADHD/possible Bipolar condition and her grandparents moving in with her due to their illness. Patient has spoken to her employer and they have discussed her taking some time off and she is sending over her leave paperwork. Dr Clearance CootsHarper is to call patient to discuss this with her.

## 2014-05-07 ENCOUNTER — Ambulatory Visit: Payer: Self-pay | Admitting: Family Medicine

## 2014-05-11 NOTE — Telephone Encounter (Signed)
Upon request called patient and discussed need for medical leave.  Advised patient to continue medication for increased stress related to her job and home life.  [facility]   Patient to follow up in office as needed.  Disability paperwork completed.

## 2014-05-19 ENCOUNTER — Other Ambulatory Visit: Payer: Self-pay | Admitting: Obstetrics

## 2014-06-18 ENCOUNTER — Ambulatory Visit (INDEPENDENT_AMBULATORY_CARE_PROVIDER_SITE_OTHER): Payer: BC Managed Care – PPO | Admitting: Obstetrics

## 2014-06-18 VITALS — BP 111/73 | HR 82 | Temp 99.1°F

## 2014-06-18 DIAGNOSIS — N949 Unspecified condition associated with female genital organs and menstrual cycle: Secondary | ICD-10-CM

## 2014-06-18 DIAGNOSIS — N760 Acute vaginitis: Secondary | ICD-10-CM

## 2014-06-18 DIAGNOSIS — R102 Pelvic and perineal pain: Secondary | ICD-10-CM

## 2014-06-18 LAB — POCT URINALYSIS DIPSTICK
BILIRUBIN UA: NEGATIVE
Blood, UA: NEGATIVE
GLUCOSE UA: NEGATIVE
Leukocytes, UA: NEGATIVE
NITRITE UA: NEGATIVE
PH UA: 5
Spec Grav, UA: 1.02
Urobilinogen, UA: NEGATIVE

## 2014-06-18 MED ORDER — IBUPROFEN 800 MG PO TABS: 800.0000 mg | ORAL_TABLET | Freq: Three times a day (TID) | ORAL | Status: DC | PRN

## 2014-06-19 ENCOUNTER — Encounter: Payer: Self-pay | Admitting: Obstetrics

## 2014-06-19 LAB — GC/CHLAMYDIA PROBE AMP
CT PROBE, AMP APTIMA: NEGATIVE
GC Probe RNA: NEGATIVE

## 2014-06-19 LAB — WET PREP BY MOLECULAR PROBE
Candida species: NEGATIVE
GARDNERELLA VAGINALIS: NEGATIVE
TRICHOMONAS VAG: NEGATIVE

## 2014-06-19 NOTE — Progress Notes (Signed)
Patient ID: Valerie Robbins, female   DOB: 03/10/1986, 28 y.o.   MRN: [id number]  Chief Complaint  Patient presents with  . Abdominal Pain    HPI Valerie Robbins is a 28 y.o. female.  Vaginal discharge.  HPI  Past Medical History  Diagnosis Date  . Abnormal Pap smear   . Anxiety   . Depression   . Asthma   . History of uterine fibroid     Past Surgical History  Procedure Laterality Date  . Colposcopy w/ biopsy / curettage      Family History  Problem Relation Age of Onset  . Hypertension Mother   . Depression Mother   . Asthma Father   . Alcohol abuse Father   . Hearing loss Sister 0    both parents, sister, grandparents were all born deaf    Social History History  Substance Use Topics  . Smoking status: Never Smoker   . Smokeless tobacco: Never Used  . Alcohol Use: Yes     Comment: social     No Known Allergies  Current Outpatient Prescriptions  Medication Sig Dispense Refill  . fluconazole (DIFLUCAN) 150 MG tablet TAKE 1 TABLET BY MOUTH EVERY OTHER DAY  2 tablet  0  . ibuprofen (ADVIL,MOTRIN) 800 MG tablet Take 1 tablet (800 mg total) by mouth every 8 (eight) hours as needed.  30 tablet  5  . metroNIDAZOLE (FLAGYL) 500 MG tablet Take 1 tablet (500 mg total) by mouth 2 (two) times daily.  14 tablet  0  . phentermine (ADIPEX-P) 37.5 MG tablet Take 37.5 mg by mouth daily before breakfast.      . Prenat w/o A-FeCbGl-DSS-FA-DHA (CITRANATAL DHA) 27-1 & 250 MG tablet Take 1 tablet by mouth daily.  30 tablet  11  . trimethoprim-polymyxin b (POLYTRIM) ophthalmic solution Place 1 drop into the right eye every 3 (three) hours. While awake, up to 6 doses per day  10 mL  0  . valACYclovir (VALTREX) 500 MG tablet Take 1 tablet (500 mg total) by mouth 2 (two) times daily. For 3 days  30 tablet  prn   No current facility-administered medications for this visit.    Review of Systems Review of Systems Constitutional: negative for fatigue and weight  loss Respiratory: negative for cough and wheezing Cardiovascular: negative for chest pain, fatigue and palpitations Gastrointestinal: negative for abdominal pain and change in bowel habits Genitourinary: vaginal discharge Integument/breast: negative for nipple discharge Musculoskeletal:negative for myalgias Neurological: negative for gait problems and tremors Behavioral/Psych: negative for abusive relationship, depression Endocrine: negative for temperature intolerance     Blood pressure 111/73, pulse 82, temperature 99.1 F (37.3 C), last menstrual period 05/31/2014.  Physical Exam Physical Exam General:   alert  Skin:   no rash or abnormalities  Lungs:   clear to auscultation bilaterally  Heart:   regular rate and rhythm, S1, S2 normal, no murmur, click, rub or gallop  Breasts:   normal without suspicious masses, skin or nipple changes or axillary nodes  Abdomen:  normal findings: no organomegaly, soft, non-tender and no hernia  Pelvis:  External genitalia: normal general appearance Urinary system: urethral meatus normal and bladder without fullness, nontender Vaginal: grey, thin vaginal discharge Cervix: normal appearance Adnexa: normal bimanual exam Uterus: anteverted and non-tender, normal size    Data Reviewed Labs  Assessment    BV  Pelvic pain    Plan    Tinidazole Rx Ultrasound ordered Orders Placed This Encounter  Procedures  .  WET PREP BY MOLECULAR PROBE  . GC/Chlamydia Probe Amp  . US Pelvis Complete    Standing Status: Future     Number of Occurrences:      Standing Expiration Date: 08/19/2015    Order Specific Question:  Reason for Exam (SYMPTOM  OR DIAGNOSIS REQUIRED)    Answer:  625.9    Order Specific Question:  Preferred imaging location?    Answer:  Internal  . US Transvaginal Non-OB    Standing Status: Future     Number of Occurrences:      Standing Expiration Date: 08/19/2015    Order Specific Question:  Reason for Exam (SYMPTOM  OR  DIAGNOSIS REQUIRED)    Answer:  625.9    Order Specific Question:  Preferred imaging location?    Answer:  Internal  . POCT urinalysis dipstick   Meds ordered this encounter  Medications  . ibuprofen (ADVIL,MOTRIN) 800 MG tablet    Sig: Take 1 tablet (800 mg total) by mouth every 8 (eight) hours as needed.    Dispense:  30 tablet    Refill:  5        HARPER,CHARLES A 06/19/2014, 6:25 AM

## 2014-06-21 ENCOUNTER — Ambulatory Visit (HOSPITAL_COMMUNITY)
Admission: RE | Admit: 2014-06-21 | Discharge: 2014-06-21 | Disposition: A | Discharge status: Home / Self Care | Payer: BC Managed Care – PPO | Source: Ambulatory Visit | Attending: Obstetrics | Admitting: Obstetrics

## 2014-06-21 DIAGNOSIS — R102 Pelvic and perineal pain: Secondary | ICD-10-CM

## 2014-06-21 DIAGNOSIS — N926 Irregular menstruation, unspecified: Secondary | ICD-10-CM | POA: Insufficient documentation

## 2014-06-21 DIAGNOSIS — N949 Unspecified condition associated with female genital organs and menstrual cycle: Secondary | ICD-10-CM | POA: Insufficient documentation

## 2014-06-22 ENCOUNTER — Telehealth: Payer: Self-pay | Admitting: *Deleted

## 2014-06-22 NOTE — Telephone Encounter (Signed)
Patient called for lab results. Patient notified lab results were negative. Patient requested ultrasound results. Patient transferred to Dr. Clearance CootsHarper for Ultrasound Results.

## 2014-07-14 ENCOUNTER — Other Ambulatory Visit: Payer: Self-pay | Admitting: *Deleted

## 2014-07-14 DIAGNOSIS — N760 Acute vaginitis: Principal | ICD-10-CM

## 2014-07-14 DIAGNOSIS — B9689 Other specified bacterial agents as the cause of diseases classified elsewhere: Secondary | ICD-10-CM

## 2014-07-14 MED ORDER — METRONIDAZOLE 500 MG PO TABS: 500.0000 mg | ORAL_TABLET | Freq: Two times a day (BID) | ORAL | Status: DC

## 2014-07-14 NOTE — Telephone Encounter (Signed)
Pharmacy faxed referral- OK per Dr Clearance CootsHarper review

## 2014-07-20 ENCOUNTER — Telehealth: Payer: Self-pay | Admitting: *Deleted

## 2014-07-20 ENCOUNTER — Other Ambulatory Visit: Payer: Self-pay | Admitting: *Deleted

## 2014-07-20 DIAGNOSIS — N760 Acute vaginitis: Secondary | ICD-10-CM

## 2014-07-20 MED ORDER — CLINDAMYCIN PHOSPHATE (1 DOSE) 2 % VA CREA: 1.0000 | TOPICAL_CREAM | Freq: Once | VAGINAL | Status: DC

## 2014-07-20 NOTE — Telephone Encounter (Signed)
Patient has called a couple of times- ask for Valerie Robbins. 3:41 2 Attempts tp call patient - LM on VM to CB

## 2014-07-20 NOTE — Telephone Encounter (Signed)
Patient states the Flagyl is not helping and would like to take the Cleocin. She is aware that she will need a prior authorization and she will wait to hear.

## 2014-08-26 ENCOUNTER — Other Ambulatory Visit: Payer: Self-pay | Admitting: *Deleted

## 2014-08-26 MED ORDER — CITRANATAL ASSURE 35-1 & 300 MG PO MISC: 1.0000 | Freq: Every day | ORAL | Status: DC

## 2014-08-26 NOTE — Progress Notes (Signed)
Rx change for vitamins and Citranatal Assure sent to pharmacy.

## 2014-08-27 ENCOUNTER — Telehealth: Payer: Self-pay | Admitting: *Deleted

## 2014-08-27 NOTE — Telephone Encounter (Signed)
Patient called stating she needed a different PNV that was covered by family planning medicaid and wanted to know her appointment for Tuesday.   CB: 08/26/2014 Patient notified that I would speak with Dr. Clearance Coots regarding her PNV and that she did not have an appointment scheduled for Tuesday. Patient states she had made one with [facility]. Patient notified that I would find out about her appointment.   CB: 08/27/2014, Patient notified that Rosalita Chessman had sent her in a different PNV yesterday. Patient states PNV was still not covered. Patient notified that PNVs would not be covered under family planning medicaid only Birth Control is. Patient states can she have a different vitamin that is cheaper. Patient notified that I would speak with Rosalita Chessman on Tuesday and find out what would be cheaper for her and give her call about. Patient asked what the appointment she was wanting scheduled was for. Patient states she would have to call back later regarding that appointment.

## 2014-08-31 ENCOUNTER — Other Ambulatory Visit: Payer: Self-pay | Admitting: *Deleted

## 2014-09-03 ENCOUNTER — Ambulatory Visit (INDEPENDENT_AMBULATORY_CARE_PROVIDER_SITE_OTHER): Payer: BC Managed Care – PPO | Admitting: Obstetrics

## 2014-09-03 ENCOUNTER — Other Ambulatory Visit: Payer: Self-pay | Admitting: Obstetrics

## 2014-09-03 VITALS — BP 130/78 | HR 71 | Temp 98.9°F | Wt 177.0 lb

## 2014-09-03 DIAGNOSIS — M62830 Muscle spasm of back: Secondary | ICD-10-CM

## 2014-09-03 DIAGNOSIS — M5380 Other specified dorsopathies, site unspecified: Secondary | ICD-10-CM

## 2014-09-03 DIAGNOSIS — A499 Bacterial infection, unspecified: Secondary | ICD-10-CM

## 2014-09-03 DIAGNOSIS — B9689 Other specified bacterial agents as the cause of diseases classified elsewhere: Secondary | ICD-10-CM

## 2014-09-03 DIAGNOSIS — N760 Acute vaginitis: Secondary | ICD-10-CM

## 2014-09-03 MED ORDER — PRENATAL PLUS 27-1 MG PO TABS: 1.0000 | ORAL_TABLET | Freq: Every day | ORAL | Status: DC

## 2014-09-03 MED ORDER — CYCLOBENZAPRINE HCL 10 MG PO TABS: 10.0000 mg | ORAL_TABLET | Freq: Three times a day (TID) | ORAL | Status: DC | PRN

## 2014-09-03 MED ORDER — METRONIDAZOLE 500 MG PO TABS
500.0000 mg | ORAL_TABLET | Freq: Two times a day (BID) | ORAL | Status: DC
Start: 2014-09-03 — End: 2015-01-31

## 2014-09-03 MED ORDER — CLINDAMYCIN PHOSPHATE (1 DOSE) 2 % VA CREA: 1.0000 | TOPICAL_CREAM | Freq: Once | VAGINAL | Status: DC

## 2014-09-04 LAB — WET PREP BY MOLECULAR PROBE
Candida species: POSITIVE — AB
Gardnerella vaginalis: POSITIVE — AB
Trichomonas vaginosis: NEGATIVE

## 2014-09-04 LAB — GC/CHLAMYDIA PROBE AMP
CT Probe RNA: NEGATIVE
GC Probe RNA: NEGATIVE

## 2014-09-05 ENCOUNTER — Encounter: Payer: Self-pay | Admitting: Obstetrics

## 2014-09-05 NOTE — Progress Notes (Signed)
Patient ID: Valerie Robbins, female   DOB: [birth year], 28 y.o.   MRN: [id number]  Chief Complaint  Patient presents with  . Vaginitis    HPI Valerie Robbins is a 28 y.o. female.  Malodorous and irritating vaginal discharge.  HPI  Past Medical History  Diagnosis Date  . Abnormal Pap smear   . Anxiety   . Depression   . Asthma   . History of uterine fibroid     Past Surgical History  Procedure Laterality Date  . Colposcopy w/ biopsy / curettage      Family History  Problem Relation Age of Onset  . Hypertension Mother   . Depression Mother   . Asthma Father   . Alcohol abuse Father   . Hearing loss Sister 0    both parents, sister, grandparents were all born deaf    Social History History  Substance Use Topics  . Smoking status: Never Smoker   . Smokeless tobacco: Never Used  . Alcohol Use: Yes     Comment: social     No Known Allergies  Current Outpatient Prescriptions  Medication Sig Dispense Refill  . Clindamycin Phosphate, 1 Dose, (CLINDESSE) vaginal cream Apply 1 Applicatorful topically once.  5.8 g  0  . cyclobenzaprine (FLEXERIL) 10 MG tablet Take 1 tablet (10 mg total) by mouth every 8 (eight) hours as needed for muscle spasms.  30 tablet  1  . fluconazole (DIFLUCAN) 150 MG tablet TAKE 1 TABLET BY MOUTH EVERY OTHER DAY  2 tablet  0  . ibuprofen (ADVIL,MOTRIN) 800 MG tablet Take 1 tablet (800 mg total) by mouth every 8 (eight) hours as needed.  30 tablet  5  . metroNIDAZOLE (FLAGYL) 500 MG tablet Take 1 tablet (500 mg total) by mouth 2 (two) times daily.  14 tablet  2  . phentermine (ADIPEX-P) 37.5 MG tablet Take 37.5 mg by mouth daily before breakfast.      . prenatal vitamin w/FE, FA (PRENATAL 1 + 1) 27-1 MG TABS tablet Take 1 tablet by mouth daily at 12 noon.  30 each  11  . trimethoprim-polymyxin b (POLYTRIM) ophthalmic solution Place 1 drop into the right eye every 3 (three) hours. While awake, up to 6 doses per day  10 mL  0  . valACYclovir  (VALTREX) 500 MG tablet Take 1 tablet (500 mg total) by mouth 2 (two) times daily. For 3 days  30 tablet  prn   No current facility-administered medications for this visit.    Review of Systems Review of Systems Constitutional: negative for fatigue and weight loss Respiratory: negative for cough and wheezing Cardiovascular: negative for chest pain, fatigue and palpitations Gastrointestinal: negative for abdominal pain and change in bowel habits Genitourinary: malodorous vaginal discharge Integument/breast: negative for nipple discharge Musculoskeletal:negative for myalgias Neurological: negative for gait problems and tremors Behavioral/Psych: negative for abusive relationship, depression Endocrine: negative for temperature intolerance     Blood pressure 130/78, pulse 71, temperature 98.9 F (37.2 C), weight 177 lb (80.287 kg), last menstrual period 08/07/2014.  Physical Exam Physical Exam General:   alert  Skin:   no rash or abnormalities  Lungs:   clear to auscultation bilaterally  Heart:   regular rate and rhythm, S1, S2 normal, no murmur, click, rub or gallop  Breasts:   normal without suspicious masses, skin or nipple changes or axillary nodes  Abdomen:  normal findings: no organomegaly, soft, non-tender and no hernia  Pelvis:  External genitalia: normal general appearance Urinary  system: urethral meatus normal and bladder without fullness, nontender Vaginal: normal without tenderness, induration or masses.  Grey, thin discharge Cervix: normal appearance Adnexa: normal bimanual exam Uterus: anteverted and non-tender, normal size      Data Reviewed Labs  Assessment    BV     Plan    Orders Placed This Encounter  Procedures  . WET PREP BY MOLECULAR PROBE  . GC/Chlamydia Probe Amp   Meds ordered this encounter  Medications  . DISCONTD: prenatal vitamin w/FE, FA (PRENATAL 1 + 1) 27-1 MG TABS tablet    Sig: Take 1 tablet by mouth daily at 12 noon.    Dispense:   30 each    Refill:  0  . prenatal vitamin w/FE, FA (PRENATAL 1 + 1) 27-1 MG TABS tablet    Sig: Take 1 tablet by mouth daily at 12 noon.    Dispense:  30 each    Refill:  11  . DISCONTD: Clindamycin Phosphate, 1 Dose, (CLINDESSE) vaginal cream    Sig: Apply 1 Applicatorful topically once.    Dispense:  5.8 g    Refill:  0  . DISCONTD: cyclobenzaprine (FLEXERIL) 10 MG tablet    Sig: Take 1 tablet (10 mg total) by mouth every 8 (eight) hours as needed for muscle spasms.    Dispense:  30 tablet    Refill:  1  . Clindamycin Phosphate, 1 Dose, (CLINDESSE) vaginal cream    Sig: Apply 1 Applicatorful topically once.    Dispense:  5.8 g    Refill:  0  . cyclobenzaprine (FLEXERIL) 10 MG tablet    Sig: Take 1 tablet (10 mg total) by mouth every 8 (eight) hours as needed for muscle spasms.    Dispense:  30 tablet    Refill:  1  . metroNIDAZOLE (FLAGYL) 500 MG tablet    Sig: Take 1 tablet (500 mg total) by mouth 2 (two) times daily.    Dispense:  14 tablet    Refill:  2        HARPER,CHARLES A 09/05/2014, 7:56 AM

## 2014-09-06 NOTE — Telephone Encounter (Signed)
Pt advised of prenatal at last appt.  Rx sent to pharmacy.

## 2014-09-07 ENCOUNTER — Other Ambulatory Visit: Payer: Self-pay | Admitting: Obstetrics

## 2014-09-07 DIAGNOSIS — B373 Candidiasis of vulva and vagina: Secondary | ICD-10-CM

## 2014-09-07 DIAGNOSIS — B3731 Acute candidiasis of vulva and vagina: Secondary | ICD-10-CM

## 2014-09-07 MED ORDER — FLUCONAZOLE 150 MG PO TABS: 150.0000 mg | ORAL_TABLET | Freq: Once | ORAL | Status: DC

## 2014-09-08 ENCOUNTER — Encounter: Payer: Self-pay | Admitting: Obstetrics

## 2014-09-08 DIAGNOSIS — M62830 Muscle spasm of back: Secondary | ICD-10-CM | POA: Insufficient documentation

## 2014-09-08 DIAGNOSIS — N760 Acute vaginitis: Secondary | ICD-10-CM | POA: Insufficient documentation

## 2014-10-19 ENCOUNTER — Other Ambulatory Visit: Payer: Self-pay | Admitting: Obstetrics & Gynecology

## 2014-10-22 NOTE — Telephone Encounter (Signed)
Please advise 

## 2014-10-25 ENCOUNTER — Encounter: Payer: Self-pay | Admitting: Obstetrics

## 2014-10-26 ENCOUNTER — Encounter (HOSPITAL_COMMUNITY): Payer: Self-pay | Admitting: *Deleted

## 2014-10-26 ENCOUNTER — Emergency Department (INDEPENDENT_AMBULATORY_CARE_PROVIDER_SITE_OTHER)
Admission: EM | Admit: 2014-10-26 | Discharge: 2014-10-26 | Disposition: A | Discharge status: Home / Self Care | Payer: BC Managed Care – PPO | Source: Home / Self Care | Attending: Emergency Medicine | Admitting: Emergency Medicine

## 2014-10-26 DIAGNOSIS — B354 Tinea corporis: Secondary | ICD-10-CM

## 2014-10-26 MED ORDER — FLUCONAZOLE 200 MG PO TABS
200.0000 mg | ORAL_TABLET | ORAL | Status: AC
Start: 2014-10-26 — End: 2014-11-02

## 2014-10-26 MED ORDER — CLOTRIMAZOLE-BETAMETHASONE 1-0.05 % EX CREA: TOPICAL_CREAM | CUTANEOUS | Status: DC

## 2014-10-26 MED ORDER — FLUCONAZOLE 200 MG PO TABS: 200.0000 mg | ORAL_TABLET | ORAL | Status: DC

## 2014-10-26 NOTE — ED Notes (Signed)
Rash onset R upper chest 3 weeks ago with mild itching. Now they are spreading all over her trunk and L breast.  Approximately 9 now.  The one on R rib cage is scaly.

## 2014-10-26 NOTE — ED Provider Notes (Signed)
CSN: [id number]     Arrival date & time 10/26/14  1827 History   First MD Initiated Contact with Patient 10/26/14 1916     Chief Complaint  Patient presents with  . Rash   (Consider location/radiation/quality/duration/timing/severity/associated sxs/prior Treatment) HPI  She is a 28 year old woman here for evaluation of skin rash. It started about 3 weeks ago with a circular lesion on her right upper chest. She has gradually developed additional annular lesions across her chest and on her back. They are mildly itchy. There is scale on some of them. She denies any current or recent URI symptoms.  Past Medical History  Diagnosis Date  . Abnormal Pap smear   . Anxiety   . Depression   . Asthma   . History of uterine fibroid    Past Surgical History  Procedure Laterality Date  . Colposcopy w/ biopsy / curettage     Family History  Problem Relation Age of Onset  . Hypertension Mother   . Depression Mother   . Asthma Father   . Alcohol abuse Father   . Hearing loss Sister 0    both parents, sister, grandparents were all born deaf   History  Substance Use Topics  . Smoking status: Never Smoker   . Smokeless tobacco: Never Used  . Alcohol Use: Yes     Comment: social    OB History    Gravida Para Term Preterm AB TAB SAB Ectopic Multiple Living   5 3 3  0 2 0 1 1 0 3     Review of Systems  Constitutional: Negative.   HENT: Negative.   Respiratory: Negative.   Skin: Positive for rash.    Allergies  Review of patient's allergies indicates no known allergies.  Home Medications   Prior to Admission medications   Medication Sig Start Date End Date Taking? Authorizing Provider  Clindamycin Phosphate, 1 Dose, (CLINDESSE) vaginal cream Apply 1 Applicatorful topically once. 09/03/14   Brock Badharles A Harper, MD  clotrimazole-betamethasone (LOTRISONE) cream Apply to affected area 2 times daily until rash is gone 10/26/14   Charm RingsErin J Vincent Ehrler, MD  cyclobenzaprine (FLEXERIL) 10 MG tablet Take  1 tablet (10 mg total) by mouth every 8 (eight) hours as needed for muscle spasms. 09/03/14   Brock Badharles A Harper, MD  fluconazole (DIFLUCAN) 200 MG tablet Take 1 tablet (200 mg total) by mouth once a week. 10/26/14 11/02/14  Charm RingsErin J Delana Manganello, MD  ibuprofen (ADVIL,MOTRIN) 800 MG tablet Take 1 tablet (800 mg total) by mouth every 8 (eight) hours as needed. 06/18/14   Brock Badharles A Harper, MD  metroNIDAZOLE (FLAGYL) 500 MG tablet Take 1 tablet (500 mg total) by mouth 2 (two) times daily. 09/03/14   Brock Badharles A Harper, MD  phentermine (ADIPEX-P) 37.5 MG tablet Take 37.5 mg by mouth daily before breakfast.    Historical Provider, MD  prenatal vitamin w/FE, FA (PRENATAL 1 + 1) 27-1 MG TABS tablet Take 1 tablet by mouth daily at 12 noon. 09/03/14   Brock Badharles A Harper, MD  trimethoprim-polymyxin b (POLYTRIM) ophthalmic solution Place 1 drop into the right eye every 3 (three) hours. While awake, up to 6 doses per day 05/01/14   Graylon GoodZachary H Baker, PA-C  valACYclovir (VALTREX) 500 MG tablet Take 1 tablet (500 mg total) by mouth 2 (two) times daily. For 3 days 02/17/14   Brock Badharles A Harper, MD   BP 137/85 mmHg  Pulse 69  Temp(Src) 99.5 F (37.5 C) (Oral)  Resp 20  SpO2 100%  LMP 10/09/2014 Physical Exam  Constitutional: She is oriented to person, place, and time. She appears well-developed and well-nourished. No distress.  Cardiovascular: Normal rate.   Pulmonary/Chest: Effort normal.  Neurological: She is alert and oriented to person, place, and time.  Skin:  8 annular lesions with scale across chest and upper back    ED Course  Procedures (including critical care time) Labs Review Labs Reviewed - No data to display  Imaging Review No results found.   MDM   1. Tinea corporis    Differential also includes pityriasis rosea. However, there is no clear Harold patch and she has had no URI symptoms. Will treat with topical clotrimazole-betamethasone BID until rash resolves. Given the extent of involvement, we'll also  treat with Diflucan 2 mg once weekly for 4 weeks. Follow-up as needed.    Charm RingsErin J Raun Routh, MD 10/26/14 [phone]

## 2014-10-26 NOTE — ED Notes (Signed)
Pt. Asked for Rx.'s to be sent in, so they would be ready when she got there.  I asked Dr. Piedad ClimesHonig and she agreed to do this.

## 2014-10-26 NOTE — Discharge Instructions (Signed)
You have a fungal infection of your skin. Apply the cream twice a day until the rash is gone. Take Diflucan 200mg  once a week for 4 weeks. The rash should be gone in the next 2 weeks.  Follow up as needed.

## 2014-12-20 ENCOUNTER — Encounter: Payer: Self-pay | Admitting: *Deleted

## 2014-12-21 ENCOUNTER — Encounter: Payer: Self-pay | Admitting: Obstetrics & Gynecology

## 2015-01-31 ENCOUNTER — Telehealth: Payer: Self-pay | Admitting: *Deleted

## 2015-01-31 DIAGNOSIS — Z30011 Encounter for initial prescription of contraceptive pills: Secondary | ICD-10-CM

## 2015-01-31 DIAGNOSIS — B9689 Other specified bacterial agents as the cause of diseases classified elsewhere: Secondary | ICD-10-CM

## 2015-01-31 DIAGNOSIS — N760 Acute vaginitis: Principal | ICD-10-CM

## 2015-01-31 MED ORDER — LEVONORGESTREL-ETHINYL ESTRAD 0.15-30 MG-MCG PO TABS: 1.0000 | ORAL_TABLET | Freq: Every day | ORAL | Status: DC

## 2015-01-31 MED ORDER — METRONIDAZOLE 500 MG PO TABS: 500.0000 mg | ORAL_TABLET | Freq: Two times a day (BID) | ORAL | Status: DC

## 2015-01-31 NOTE — Telephone Encounter (Signed)
Patient called stating she has BV and would like treatment. She also requested OCP prescription. OK to send Rx per Dr Clearance CootsHarper.

## 2015-07-10 ENCOUNTER — Emergency Department (HOSPITAL_COMMUNITY): Payer: Medicaid Other

## 2015-07-10 ENCOUNTER — Encounter (HOSPITAL_COMMUNITY): Payer: Self-pay | Admitting: Emergency Medicine

## 2015-07-10 ENCOUNTER — Emergency Department (HOSPITAL_COMMUNITY)
Admission: EM | Admit: 2015-07-10 | Discharge: 2015-07-10 | Disposition: A | Discharge status: Home / Self Care | Payer: Medicaid Other | Attending: Emergency Medicine | Admitting: Emergency Medicine

## 2015-07-10 DIAGNOSIS — Z793 Long term (current) use of hormonal contraceptives: Secondary | ICD-10-CM | POA: Insufficient documentation

## 2015-07-10 DIAGNOSIS — J45909 Unspecified asthma, uncomplicated: Secondary | ICD-10-CM | POA: Insufficient documentation

## 2015-07-10 DIAGNOSIS — Z8659 Personal history of other mental and behavioral disorders: Secondary | ICD-10-CM | POA: Insufficient documentation

## 2015-07-10 DIAGNOSIS — Y9389 Activity, other specified: Secondary | ICD-10-CM | POA: Insufficient documentation

## 2015-07-10 DIAGNOSIS — Z792 Long term (current) use of antibiotics: Secondary | ICD-10-CM | POA: Insufficient documentation

## 2015-07-10 DIAGNOSIS — S0993XA Unspecified injury of face, initial encounter: Secondary | ICD-10-CM | POA: Insufficient documentation

## 2015-07-10 DIAGNOSIS — W010XXA Fall on same level from slipping, tripping and stumbling without subsequent striking against object, initial encounter: Secondary | ICD-10-CM | POA: Insufficient documentation

## 2015-07-10 DIAGNOSIS — Y998 Other external cause status: Secondary | ICD-10-CM | POA: Insufficient documentation

## 2015-07-10 DIAGNOSIS — S59912A Unspecified injury of left forearm, initial encounter: Secondary | ICD-10-CM | POA: Insufficient documentation

## 2015-07-10 DIAGNOSIS — W19XXXA Unspecified fall, initial encounter: Secondary | ICD-10-CM

## 2015-07-10 DIAGNOSIS — S90811A Abrasion, right foot, initial encounter: Secondary | ICD-10-CM | POA: Insufficient documentation

## 2015-07-10 DIAGNOSIS — Z86018 Personal history of other benign neoplasm: Secondary | ICD-10-CM | POA: Insufficient documentation

## 2015-07-10 DIAGNOSIS — M79671 Pain in right foot: Secondary | ICD-10-CM

## 2015-07-10 DIAGNOSIS — Z79899 Other long term (current) drug therapy: Secondary | ICD-10-CM | POA: Insufficient documentation

## 2015-07-10 DIAGNOSIS — Y9289 Other specified places as the place of occurrence of the external cause: Secondary | ICD-10-CM | POA: Insufficient documentation

## 2015-07-10 DIAGNOSIS — S60512A Abrasion of left hand, initial encounter: Secondary | ICD-10-CM | POA: Insufficient documentation

## 2015-07-10 MED ORDER — IBUPROFEN 800 MG PO TABS
800.0000 mg | ORAL_TABLET | Freq: Once | ORAL | Status: AC
  Administered 2015-07-10: 800 mg via ORAL
  Filled 2015-07-10: qty 1

## 2015-07-10 MED ORDER — DICLOFENAC 35 MG PO CAPS: 1.0000 | ORAL_CAPSULE | Freq: Three times a day (TID) | ORAL | Status: DC | PRN

## 2015-07-10 NOTE — ED Notes (Signed)
Pt states that she tripped and fell causing her to have an abrasion and swelling to the lateral swelling of her R foot, her R middle knuckle, L jaw and L posterior forearm. Alert and oriented.

## 2015-07-10 NOTE — Discharge Instructions (Signed)
Please follow up with your primary care physician in 1-2 days. If you do not have one please call the [facility] number listed above. Please read all discharge instructions and return precautions.   Fall Prevention and Home Safety Falls cause injuries and can affect all age groups. It is possible to use preventive measures to significantly decrease the likelihood of falls. There are many simple measures which can make your home safer and prevent falls. OUTDOORS  Repair cracks and edges of walkways and driveways.  Remove high doorway thresholds.  Trim shrubbery on the main path into your home.  Have good outside lighting.  Clear walkways of tools, rocks, debris, and clutter.  Check that handrails are not broken and are securely fastened. Both sides of steps should have handrails.  Have leaves, snow, and ice cleared regularly.  Use sand or salt on walkways during winter months.  In the garage, clean up grease or oil spills. BATHROOM  Install night lights.  Install grab bars by the toilet and in the tub and shower.  Use non-skid mats or decals in the tub or shower.  Place a plastic non-slip stool in the shower to sit on, if needed.  Keep floors dry and clean up all water on the floor immediately.  Remove soap buildup in the tub or shower on a regular basis.  Secure bath mats with non-slip, double-sided rug tape.  Remove throw rugs and tripping hazards from the floors. BEDROOMS  Install night lights.  Make sure a bedside light is easy to reach.  Do not use oversized bedding.  Keep a telephone by your bedside.  Have a firm chair with side arms to use for getting dressed.  Remove throw rugs and tripping hazards from the floor. KITCHEN  Keep handles on pots and pans turned toward the center of the stove. Use back burners when possible.  Clean up spills quickly and allow time for drying.  Avoid walking on wet floors.  Avoid hot utensils and  knives.  Position shelves so they are not too high or low.  Place commonly used objects within easy reach.  If necessary, use a sturdy step stool with a grab bar when reaching.  Keep electrical cables out of the way.  Do not use floor polish or wax that makes floors slippery. If you must use wax, use non-skid floor wax.  Remove throw rugs and tripping hazards from the floor. STAIRWAYS  Never leave objects on stairs.  Place handrails on both sides of stairways and use them. Fix any loose handrails. Make sure handrails on both sides of the stairways are as long as the stairs.  Check carpeting to make sure it is firmly attached along stairs. Make repairs to worn or loose carpet promptly.  Avoid placing throw rugs at the top or bottom of stairways, or properly secure the rug with carpet tape to prevent slippage. Get rid of throw rugs, if possible.  Have an electrician put in a light switch at the top and bottom of the stairs. OTHER FALL PREVENTION TIPS  Wear low-heel or rubber-soled shoes that are supportive and fit well. Wear closed toe shoes.  When using a stepladder, make sure it is fully opened and both spreaders are firmly locked. Do not climb a closed stepladder.  Add color or contrast paint or tape to grab bars and handrails in your home. Place contrasting color strips on first and last steps.  Learn and use mobility aids as needed. Install  an electrical emergency response system.  Turn on lights to avoid dark areas. Replace light bulbs that burn out immediately. Get light switches that glow.  Arrange furniture to create clear pathways. Keep furniture in the same place.  Firmly attach carpet with non-skid or double-sided tape.  Eliminate uneven floor surfaces.  Select a carpet pattern that does not visually hide the edge of steps.  Be aware of all pets. OTHER HOME SAFETY TIPS  Set the water temperature for 120 F (48.8 C).  Keep emergency numbers on or near the  telephone.  Keep smoke detectors on every level of the home and near sleeping areas. Document Released: 11/30/2002 Document Revised: 06/10/2012 Document Reviewed: 02/29/2012 [facility] Patient Information 2015 [place], [place]. This information is not intended to replace advice given to you by your health care provider. Make sure you discuss any questions you have with your health care provider.

## 2015-07-10 NOTE — ED Provider Notes (Signed)
CSN: [id number]     Arrival date & time 07/10/15  2043 History  This chart was scribed for non-physician practitioner, Francee Piccolo, PA-C, working with No att. providers found by Marica Otter, ED Scribe. This patient was seen in room WTR7/WTR7 and the patient's care was started at 9:07 PM.  Chief Complaint  Patient presents with  . Fall   The history is provided by the patient. No language interpreter was used.   PCP: No primary care provider on file. HPI Comments: Valerie Robbins is a 29 y.o. female, with PMHx noted below, who presents to the Emergency Department complaining of a fall onset this morning after pt tripped and fell on her side. Pt denies head trauma or LOC. Pt also complains of associated pain to left leg; pain to right foot; and abrasion/swelling to the right foot, right middle knuckle, left jaw and left posterior forearm. Pt reports taking tylenol at home without relief.    Past Medical History  Diagnosis Date  . Abnormal Pap smear   . Anxiety   . Depression   . Asthma   . History of uterine fibroid    Past Surgical History  Procedure Laterality Date  . Colposcopy w/ biopsy / curettage     Family History  Problem Relation Age of Onset  . Hypertension Mother   . Depression Mother   . Asthma Father   . Alcohol abuse Father   . Hearing loss Sister 0    both parents, sister, grandparents were all born deaf   History  Substance Use Topics  . Smoking status: Never Smoker   . Smokeless tobacco: Never Used  . Alcohol Use: Yes     Comment: social    OB History    Gravida Para Term Preterm AB TAB SAB Ectopic Multiple Living   5 3 3  0 2 0 1 1 0 3     Review of Systems  Constitutional: Negative for fever and chills.  Musculoskeletal: Positive for arthralgias (right foot pain and left leg pain).       Swelling of:  right foot, right middle knuckle, left jaw, left posterior forearm   Skin: Positive for color change and wound (abrasion to right foot,  right middle knuckle, left jaw, left posterior forearm ).  All other systems reviewed and are negative.  Allergies  Review of patient's allergies indicates no known allergies.  Home Medications   Prior to Admission medications   Medication Sig Start Date End Date Taking? Authorizing Provider  Clindamycin Phosphate, 1 Dose, (CLINDESSE) vaginal cream Apply 1 Applicatorful topically once. Patient not taking: Reported on 07/10/2015 09/03/14   Brock Bad, MD  clotrimazole-betamethasone (LOTRISONE) cream Apply to affected area 2 times daily until rash is gone Patient not taking: Reported on 07/10/2015 10/26/14   Charm Rings, MD  cyclobenzaprine (FLEXERIL) 10 MG tablet Take 1 tablet (10 mg total) by mouth every 8 (eight) hours as needed for muscle spasms. Patient not taking: Reported on 07/10/2015 09/03/14   Brock Bad, MD  Diclofenac 35 MG CAPS Take 1 tablet by mouth 3 (three) times daily as needed (pain). 07/10/15   Braxtin Bamba, PA-C  fluconazole (DIFLUCAN) 150 MG tablet TAKE 1 TABLET BY MOUTH EVERY OTHER DAY Patient not taking: Reported on 07/10/2015 11/12/14   Antionette Char, MD  ibuprofen (ADVIL,MOTRIN) 800 MG tablet Take 1 tablet (800 mg total) by mouth every 8 (eight) hours as needed. Patient not taking: Reported on 07/10/2015 06/18/14   Bing Neighbors  Clearance CootsHarper, MD  levonorgestrel-ethinyl estradiol (NORDETTE) 0.15-30 MG-MCG tablet Take 1 tablet by mouth daily. Patient not taking: Reported on 07/10/2015 01/31/15   Brock Badharles A Harper, MD  metroNIDAZOLE (FLAGYL) 500 MG tablet Take 1 tablet (500 mg total) by mouth 2 (two) times daily. Patient not taking: Reported on 07/10/2015 01/31/15   Brock Badharles A Harper, MD  prenatal vitamin w/FE, FA (PRENATAL 1 + 1) 27-1 MG TABS tablet Take 1 tablet by mouth daily at 12 noon. Patient not taking: Reported on 07/10/2015 09/03/14   Brock Badharles A Harper, MD  trimethoprim-polymyxin b [facility](POLYTRIM) ophthalmic solution Place 1 drop into the right eye every 3 (three) hours.  While awake, up to 6 doses per day Patient not taking: Reported on 07/10/2015 05/01/14   Graylon GoodZachary H Baker, PA-C  valACYclovir (VALTREX) 500 MG tablet Take 1 tablet (500 mg total) by mouth 2 (two) times daily. For 3 days Patient not taking: Reported on 07/10/2015 02/17/14   Brock Badharles A Harper, MD   Triage Vitals: BP 123/78 mmHg  Pulse 74  Temp(Src) 97.9 F (36.6 C) (Oral)  Resp 18  Ht 5\' 5"  (1.651 m)  Wt 169 lb 3.2 oz (76.749 kg)  BMI 28.16 kg/m2  SpO2 95%  LMP 06/29/2015 Physical Exam  Constitutional: She is oriented to person, place, and time. She appears well-developed and well-nourished. No distress.  HENT:  Head: Normocephalic and atraumatic.  Right Ear: External ear normal.  Left Ear: External ear normal.  Nose: Nose normal.  Mouth/Throat: Oropharynx is clear and moist.  Eyes: Conjunctivae and EOM are normal. Pupils are equal, round, and reactive to light.  Neck: Normal range of motion. Neck supple.  No nuchal rigidity.   Cardiovascular: Normal rate, regular rhythm, normal heart sounds and intact distal pulses.   Pulmonary/Chest: Effort normal and breath sounds normal.  Abdominal: Soft.  Musculoskeletal: Normal range of motion.       Right ankle: Normal.       Cervical back: Normal.       Left forearm: She exhibits no tenderness, no bony tenderness, no swelling and no deformity.       Arms:      Left hand: Normal.       Right foot: There is tenderness (over abrasion (no drainage or bleeding)). There is normal range of motion, no swelling, normal capillary refill and no deformity.       Feet:  Neurological: She is alert and oriented to person, place, and time. She has normal strength. Gait normal. GCS eye subscore is 4. GCS verbal subscore is 5. GCS motor subscore is 6.  Skin: Skin is warm and dry. Abrasion noted. She is not diaphoretic.     Psychiatric: She has a normal mood and affect.  Nursing note and vitals reviewed.   ED Course  Procedures (including critical care  time) Medications  ibuprofen (ADVIL,MOTRIN) tablet 800 mg (800 mg Oral Given 07/10/15 2122)    DIAGNOSTIC STUDIES: Oxygen Saturation is 95% on RA, nl by my interpretation.    COORDINATION OF CARE: 9:14 PM-Discussed treatment plan which includes imaging and pain meds with pt at bedside and pt agreed to plan.   Labs Review Labs Reviewed - No data to display  Imaging Review No results found.   EKG Interpretation None      Patient declines imaging, would like to be discharged home with prescription. MDM   Final diagnoses:  Fall, initial encounter  Right foot pain    Filed Vitals:   07/10/15 2047  BP:  123/78  Pulse: 74  Temp: 97.9 F (36.6 C)  Resp: 18   Afebrile, NAD, non-toxic appearing, AAOx4.   No neurodeficits on examination. Neurovascularly intact. Normal sensation. No evidence of compartment syndrome. Pain managed in ED. Pt advised to follow up with PCP if symptoms persist for possibility of missed fracture diagnosis. Patient given motrin while in ED, conservative therapy recommended and discussed. Patient will be dc home &  is agreeable with above plan. Patient is stable at time of discharge       I personally performed the services described in this documentation, which was scribed in my presence. The recorded information has been reviewed and is accurate.     Francee Piccolo, PA-C 07/10/15 2227  Linwood Dibbles, MD 07/15/15 [phone]

## 2015-08-23 ENCOUNTER — Telehealth: Payer: Self-pay | Admitting: *Deleted

## 2015-08-23 ENCOUNTER — Other Ambulatory Visit: Payer: Self-pay | Admitting: Obstetrics

## 2015-08-23 DIAGNOSIS — N760 Acute vaginitis: Principal | ICD-10-CM

## 2015-08-23 DIAGNOSIS — B9689 Other specified bacterial agents as the cause of diseases classified elsewhere: Secondary | ICD-10-CM

## 2015-08-23 DIAGNOSIS — M62830 Muscle spasm of back: Secondary | ICD-10-CM

## 2015-08-23 MED ORDER — METRONIDAZOLE 500 MG PO TABS: 500.0000 mg | ORAL_TABLET | Freq: Two times a day (BID) | ORAL | Status: DC

## 2015-08-23 MED ORDER — CYCLOBENZAPRINE HCL 10 MG PO TABS: 10.0000 mg | ORAL_TABLET | Freq: Three times a day (TID) | ORAL | Status: DC | PRN

## 2015-08-23 NOTE — Telephone Encounter (Signed)
Patient is requesting refill of her Flexeril.Will check with Dr Clearance Coots.

## 2015-08-23 NOTE — Telephone Encounter (Signed)
Patient is requesting treatment for BV- she changed soap and has iritation. Patient also needs Annual exam.  Rx to pharmacy and AEX scheduled.

## 2015-09-07 ENCOUNTER — Telehealth: Payer: Self-pay | Admitting: Obstetrics

## 2015-09-08 ENCOUNTER — Ambulatory Visit (INDEPENDENT_AMBULATORY_CARE_PROVIDER_SITE_OTHER): Payer: BLUE CROSS/BLUE SHIELD | Admitting: Obstetrics

## 2015-09-08 ENCOUNTER — Telehealth: Payer: Self-pay

## 2015-09-08 ENCOUNTER — Encounter: Payer: Self-pay | Admitting: Obstetrics

## 2015-09-08 VITALS — BP 119/79 | HR 71 | Temp 97.0°F | Wt 169.0 lb

## 2015-09-08 DIAGNOSIS — F5105 Insomnia due to other mental disorder: Secondary | ICD-10-CM | POA: Diagnosis not present

## 2015-09-08 DIAGNOSIS — Z01419 Encounter for gynecological examination (general) (routine) without abnormal findings: Secondary | ICD-10-CM | POA: Diagnosis not present

## 2015-09-08 DIAGNOSIS — A609 Anogenital herpesviral infection, unspecified: Secondary | ICD-10-CM

## 2015-09-08 DIAGNOSIS — F341 Dysthymic disorder: Secondary | ICD-10-CM | POA: Diagnosis not present

## 2015-09-08 DIAGNOSIS — Z124 Encounter for screening for malignant neoplasm of cervix: Secondary | ICD-10-CM | POA: Diagnosis not present

## 2015-09-08 DIAGNOSIS — F32A Depression, unspecified: Secondary | ICD-10-CM

## 2015-09-08 DIAGNOSIS — Z9189 Other specified personal risk factors, not elsewhere classified: Secondary | ICD-10-CM

## 2015-09-08 DIAGNOSIS — F419 Anxiety disorder, unspecified: Secondary | ICD-10-CM

## 2015-09-08 DIAGNOSIS — F418 Other specified anxiety disorders: Secondary | ICD-10-CM

## 2015-09-08 DIAGNOSIS — F329 Major depressive disorder, single episode, unspecified: Secondary | ICD-10-CM

## 2015-09-08 DIAGNOSIS — N926 Irregular menstruation, unspecified: Secondary | ICD-10-CM

## 2015-09-08 DIAGNOSIS — A6009 Herpesviral infection of other urogenital tract: Secondary | ICD-10-CM

## 2015-09-08 DIAGNOSIS — D500 Iron deficiency anemia secondary to blood loss (chronic): Secondary | ICD-10-CM

## 2015-09-08 LAB — POCT URINALYSIS DIPSTICK
BILIRUBIN UA: NEGATIVE
GLUCOSE UA: NEGATIVE
KETONES UA: NEGATIVE
Leukocytes, UA: NEGATIVE
Nitrite, UA: NEGATIVE
Protein, UA: NEGATIVE
RBC UA: NEGATIVE
SPEC GRAV UA: 1.02
UROBILINOGEN UA: NEGATIVE
pH, UA: 5

## 2015-09-08 LAB — POCT URINE PREGNANCY: Preg Test, Ur: NEGATIVE

## 2015-09-08 MED ORDER — CITALOPRAM HYDROBROMIDE 20 MG PO TABS: 20.0000 mg | ORAL_TABLET | Freq: Every day | ORAL | Status: DC

## 2015-09-08 MED ORDER — ZOLPIDEM TARTRATE 5 MG PO TABS: 5.0000 mg | ORAL_TABLET | Freq: Every evening | ORAL | Status: DC | PRN

## 2015-09-08 MED ORDER — PRENATAL PLUS/IRON 27-1 MG PO TABS: 1.0000 | ORAL_TABLET | Freq: Every day | ORAL | Status: DC

## 2015-09-08 MED ORDER — VALACYCLOVIR HCL 500 MG PO TABS: 500.0000 mg | ORAL_TABLET | Freq: Two times a day (BID) | ORAL | Status: DC

## 2015-09-08 NOTE — Telephone Encounter (Signed)
patient has appt with [facility] on 09/20/15 at 10am and left message for her to call them to confirm
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
## 2015-09-09 NOTE — Telephone Encounter (Signed)
COMPLETED

## 2015-09-12 LAB — SURESWAB, VAGINOSIS/VAGINITIS PLUS
ATOPOBIUM VAGINAE: NOT DETECTED Log (cells/mL)
BV CATEGORY: UNDETERMINED — AB
C. ALBICANS, DNA: NOT DETECTED
C. TRACHOMATIS RNA, TMA: NOT DETECTED
C. glabrata, DNA: NOT DETECTED
C. parapsilosis, DNA: NOT DETECTED
C. tropicalis, DNA: NOT DETECTED
Gardnerella vaginalis: 7 Log (cells/mL)
LACTOBACILLUS SPECIES: 8 Log (cells/mL)
MEGASPHAERA SPECIES: NOT DETECTED Log (cells/mL)
N. GONORRHOEAE RNA, TMA: NOT DETECTED
T. VAGINALIS RNA, QL TMA: NOT DETECTED

## 2015-09-12 LAB — HERPES SIMPLEX VIRUS CULTURE: Organism ID, Bacteria: DETECTED

## 2015-09-13 ENCOUNTER — Other Ambulatory Visit: Payer: Self-pay | Admitting: Obstetrics

## 2015-09-13 DIAGNOSIS — B9689 Other specified bacterial agents as the cause of diseases classified elsewhere: Secondary | ICD-10-CM

## 2015-09-13 DIAGNOSIS — N760 Acute vaginitis: Principal | ICD-10-CM

## 2015-09-13 LAB — PAP, TP IMAGING W/ HPV RNA, RFLX HPV TYPE 16,18/45: HPV mRNA, High Risk: NOT DETECTED

## 2015-09-13 MED ORDER — METRONIDAZOLE 500 MG PO TABS
500.0000 mg | ORAL_TABLET | Freq: Two times a day (BID) | ORAL | Status: DC
Start: 2015-09-13 — End: 2015-12-20

## 2015-09-20 ENCOUNTER — Ambulatory Visit: Payer: Medicaid Other | Admitting: Family

## 2015-09-21 ENCOUNTER — Encounter: Payer: Self-pay | Admitting: Family

## 2015-09-21 ENCOUNTER — Ambulatory Visit (INDEPENDENT_AMBULATORY_CARE_PROVIDER_SITE_OTHER): Payer: BLUE CROSS/BLUE SHIELD | Admitting: Family

## 2015-09-21 VITALS — BP 122/82 | HR 67 | Temp 98.3°F | Resp 18 | Ht 65.0 in | Wt 171.1 lb

## 2015-09-21 DIAGNOSIS — Z0000 Encounter for general adult medical examination without abnormal findings: Secondary | ICD-10-CM | POA: Diagnosis not present

## 2015-09-21 NOTE — Progress Notes (Signed)
Subjective:    Patient ID: Valerie Robbins, female    DOB: [birth year], 29 y.o.   MRN: [id number]  Chief Complaint  Patient presents with  . Establish Care    CPE, not fasting    HPI:  Valerie Robbins is a 29 y.o. female who presents today for an annual wellness visit.   1) Health Maintenance -   Diet - Averages about 2-3 meals per day consisting of pork, chicken, beef, cheese and pasta, and little to no fruits and vegetables. Caffeine of 1-2 cups per day.   Exercise - No structured exercise.  2) Preventative Exams / Immunizations:  Dental -- Up to date  Vision -- Up to date   Health Maintenance  Topic Date Due  . TETANUS/TDAP  10/16/2003  . INFLUENZA VACCINE  07/25/2015  . PAP SMEAR  09/04/2016  . HIV Screening  Completed    There is no immunization history for the selected administration types on file for this patient.  No Known Allergies   Outpatient Prescriptions Prior to Visit  Medication Sig Dispense Refill  . citalopram (CELEXA) 20 MG tablet Take 1 tablet (20 mg total) by mouth daily. 30 tablet 11  . metroNIDAZOLE (FLAGYL) 500 MG tablet Take 1 tablet (500 mg total) by mouth 2 (two) times daily. 14 tablet 2  . Prenatal Vit-Fe Fumarate-FA (PRENATAL PLUS/IRON) 27-1 MG TABS Take 1 tablet by mouth daily before breakfast. 30 each 11  . zolpidem (AMBIEN) 5 MG tablet Take 1 tablet (5 mg total) by mouth at bedtime as needed for sleep. 30 tablet 2  . valACYclovir (VALTREX) 500 MG tablet Take 1 tablet (500 mg total) by mouth 2 (two) times daily. For 3 days 30 tablet prn   No facility-administered medications prior to visit.     Past Medical History  Diagnosis Date  . Abnormal Pap smear   . Anxiety   . Depression   . History of uterine fibroid   . Asthma     childhood     Past Surgical History  Procedure Laterality Date  . Colposcopy w/ biopsy / curettage       Family History  Problem Relation Age of Onset  . Hypertension Mother   .  Depression Mother   . Deafness Mother   . Asthma Father   . Alcohol abuse Father   . Hearing loss Sister 0    both parents, sister, grandparents were all born deaf  . Hypertension Maternal Grandmother   . Depression Maternal Grandmother   . Diabetes Maternal Grandmother   . Anxiety disorder Maternal Grandmother   . Deafness Maternal Grandfather      Social History   Social History  . Marital Status: Single    Spouse Name: N/A  . Number of Children: 3  . Years of Education: 14   Occupational History  . Not on file.   Social History Main Topics  . Smoking status: Never Smoker   . Smokeless tobacco: Never Used  . Alcohol Use: 0.0 oz/week    0 Standard drinks or equivalent per week     Comment: social   . Drug Use: No  . Sexual Activity:    Partners: Male    Pharmacist, hospital Protection: None     Comment: Last encounter 3 months ago   Other Topics Concern  . Not on file   Social History Narrative   Fun: [name] out with her children.   Denies abuse and feels safe at home.  Review of Systems  Constitutional: Denies fever, chills, fatigue, or significant weight gain/loss. HENT: Head: Denies headache or neck pain Ears: Denies changes in hearing, ringing in ears, earache, drainage Nose: Denies discharge, stuffiness, itching, nosebleed, sinus pain Throat: Denies sore throat, hoarseness, dry mouth, sores, thrush Eyes: Denies loss/changes in vision, pain, redness, blurry/double vision, flashing lights Cardiovascular: Denies chest pain/discomfort, tightness, palpitations, shortness of breath with activity, difficulty lying down, swelling, sudden awakening with shortness of breath Respiratory: Denies shortness of breath, cough, sputum production, wheezing Gastrointestinal: Denies dysphasia, heartburn, change in appetite, nausea, change in bowel habits, rectal bleeding, constipation, diarrhea, yellow skin or eyes Genitourinary: Denies frequency, urgency, burning/pain, blood  in urine, incontinence, change in urinary strength. Musculoskeletal: Denies muscle/joint pain, stiffness, back pain, redness or swelling of joints, trauma Skin: Denies rashes, lumps, itching, dryness, color changes, or hair/nail changes Neurological: Denies dizziness, fainting, seizures, weakness, numbness, tingling, tremor Psychiatric - Denies nervousness, stress, depression or memory loss Endocrine: Denies heat or cold intolerance, sweating, frequent urination, excessive thirst, changes in appetite Hematologic: Denies ease of bruising or bleeding     Objective:    BP 122/82 mmHg  Pulse 67  Temp(Src) 98.3 F (36.8 C) (Oral)  Resp 18  Ht  (1.651 m)  Wt 171 lb 1.9 oz (77.62 kg)  BMI 28.48 kg/m2  SpO2 97%  LMP 07/27/2015 Nursing note and vital signs reviewed.  Physical Exam  Constitutional: She is oriented to person, place, and time. She appears well-developed and well-nourished.  HENT:  Head: Normocephalic.  Right Ear: Hearing, tympanic membrane, external ear and ear canal normal.  Left Ear: Hearing, tympanic membrane, external ear and ear canal normal.  Nose: Nose normal.  Mouth/Throat: Uvula is midline, oropharynx is clear and moist and mucous membranes are normal.  Eyes: Conjunctivae and EOM are normal. Pupils are equal, round, and reactive to light.  Neck: Neck supple. No JVD present. No tracheal deviation present. No thyromegaly present.  Cardiovascular: Normal rate, regular rhythm, normal heart sounds and intact distal pulses.   Pulmonary/Chest: Effort normal and breath sounds normal.  Abdominal: Soft. Bowel sounds are normal. She exhibits no distension and no mass. There is no tenderness. There is no rebound and no guarding.  Musculoskeletal: Normal range of motion. She exhibits no edema or tenderness.  Lymphadenopathy:    She has no cervical adenopathy.  Neurological: She is alert and oriented to person, place, and time. She has normal reflexes. No cranial nerve  deficit. She exhibits normal muscle tone. Coordination normal.  Skin: Skin is warm and dry.  Psychiatric: She has a normal mood and affect. Her behavior is normal. Judgment and thought content normal.       Assessment & Plan:   Problem List Items Addressed This Visit      Other   Routine general medical examination at a health care facility - Primary    1) Anticipatory Guidance: Discussed importance of wearing a seatbelt while driving and not texting while driving; changing batteries in smoke detector at least once annually; wearing suntan lotion when outside; eating a balanced and moderate diet; getting physical activity at least 30 minutes per day.  2) Immunizations / Screenings / Labs:  Declined flu and tetanus today. All other immunizations are up-to-date per recommendations. All screenings are up-to-date per recommendations. Obtain CBC, CMET, Lipid profile and TSH.   Overall well exam. Risk factors for cardiovascular disease are minimal and related to sedentary lifestyle and overweight. Discussed and emphasized importance of consuming a nutrient  dense diet that is moderate in size and a variety of fruits and vegetables while minimizing red meat intake and sugary/processed starches. Increase physical activity to 30 minutes most days a week. Continue other healthy lifestyle behaviors. Follow-up pending blood work. Follow-up prevention exam in one year. Well woman exam performed by gynecology.       Relevant Orders   Lipid panel   TSH   Comprehensive metabolic panel   CBC

## 2015-09-21 NOTE — Progress Notes (Signed)
Pre visit review using our clinic review tool, if applicable. No additional management support is needed unless otherwise documented below in the visit note.   Refused flu and tetanus 

## 2015-09-21 NOTE — Patient Instructions (Signed)
Thank you for choosing [facility].  Summary/Instructions:  Please stop by the lab on the basement level of the building for your blood work. Your results will be released to MyChart (or called to you) after review, usually within 72hours after test completion. If any changes need to be made, you will be notified at that same time.  Health Maintenance Adopting a healthy lifestyle and getting preventive care can go a long way to promote health and wellness. Talk with your health care Rula Keniston about what schedule of regular examinations is right for you. This is a good chance for you to check in with your Shakura Cowing about disease prevention and staying healthy. In between checkups, there are plenty of things you can do on your own. Experts have done a lot of research about which lifestyle changes and preventive measures are most likely to keep you healthy. Ask your health care Hagop Mccollam for more information. WEIGHT AND DIET  Eat a healthy diet  Be sure to include plenty of vegetables, fruits, low-fat dairy products, and lean protein.  Do not eat a lot of foods high in solid fats, added sugars, or salt.  Get regular exercise. This is one of the most important things you can do for your health.  Most adults should exercise for at least 150 minutes each week. The exercise should increase your heart rate and make you sweat (moderate-intensity exercise).  Most adults should also do strengthening exercises at least twice a week. This is in addition to the moderate-intensity exercise.  Maintain a healthy weight  Body mass index (BMI) is a measurement that can be used to identify possible weight problems. It estimates body fat based on height and weight. Your health care Daron Breeding can help determine your BMI and help you achieve or maintain a healthy weight.  For females 20 years of age and older:   A BMI below 18.5 is considered underweight.  A BMI of 18.5 to 24.9 is normal.  A BMI of 25  to 29.9 is considered overweight.  A BMI of 30 and above is considered obese.  Watch levels of cholesterol and blood lipids  You should start having your blood tested for lipids and cholesterol at 31 years of age, then have this test every 5 years.  You may need to have your cholesterol levels checked more often if:  Your lipid or cholesterol levels are high.  You are older than 31 years of age.  You are at high risk for heart disease.  CANCER SCREENING   Lung Cancer  Lung cancer screening is recommended for adults 55-80 years old who are at high risk for lung cancer because of a history of smoking.  A yearly low-dose CT scan of the lungs is recommended for people who:  Currently smoke.  Have quit within the past 15 years.  Have at least a 30-pack-year history of smoking. A pack year is smoking an average of one pack of cigarettes a day for 1 year.  Yearly screening should continue until it has been 15 years since you quit.  Yearly screening should stop if you develop a health problem that would prevent you from having lung cancer treatment.  Breast Cancer  Practice breast self-awareness. This means understanding how your breasts normally appear and feel.  It also means doing regular breast self-exams. Let your health care Madysen Faircloth know about any changes, no matter how small.  If you are in your 20s or 30s, you should have a clinical breast exam (  exam (CBE) by a health care Janeah Kovacich every 1-3 years as part of a regular health exam.  If you are 61 or older, have a CBE every year. Also consider having a breast X-ray (mammogram) every year.  If you have a family history of breast cancer, talk to your health care Mikahla Wisor about genetic screening.  If you are at high risk for breast cancer, talk to your health care Merrily Tegeler about having an MRI and a mammogram every year.  Breast cancer gene (BRCA) assessment is recommended for women who have family members with BRCA-related  cancers. BRCA-related cancers include:  Breast.  Ovarian.  Tubal.  Peritoneal cancers.  Results of the assessment will determine the need for genetic counseling and BRCA1 and BRCA2 testing. Cervical Cancer Routine pelvic examinations to screen for cervical cancer are no longer recommended for nonpregnant women who are considered low risk for cancer of the pelvic organs (ovaries, uterus, and vagina) and who do not have symptoms. A pelvic examination may be necessary if you have symptoms including those associated with pelvic infections. Ask your health care Eriana Suliman if a screening pelvic exam is right for you.   The Pap test is the screening test for cervical cancer for women who are considered at risk.  If you had a hysterectomy for a problem that was not cancer or a condition that could lead to cancer, then you no longer need Pap tests.  If you are older than 65 years, and you have had normal Pap tests for the past 10 years, you no longer need to have Pap tests.  If you have had past treatment for cervical cancer or a condition that could lead to cancer, you need Pap tests and screening for cancer for at least 20 years after your treatment.  If you no longer get a Pap test, assess your risk factors if they change (such as having a new sexual partner). This can affect whether you should start being screened again.  Some women have medical problems that increase their chance of getting cervical cancer. If this is the case for you, your health care Wrigley Plasencia may recommend more frequent screening and Pap tests.  The human papillomavirus (HPV) test is another test that may be used for cervical cancer screening. The HPV test looks for the virus that can cause cell changes in the cervix. The cells collected during the Pap test can be tested for HPV.  The HPV test can be used to screen women 64 years of age and older. Getting tested for HPV can extend the interval between normal Pap tests from  three to five years.  An HPV test also should be used to screen women of any age who have unclear Pap test results.  After 31 years of age, women should have HPV testing as often as Pap tests.  Colorectal Cancer  This type of cancer can be detected and often prevented.  Routine colorectal cancer screening usually begins at 31 years of age and continues through 31 years of age.  Your health care Anjolie Majer may recommend screening at an earlier age if you have risk factors for colon cancer.  Your health care Lavena Loretto may also recommend using home test kits to check for hidden blood in the stool.  A small camera at the end of a tube can be used to examine your colon directly (sigmoidoscopy or colonoscopy). This is done to check for the earliest forms of colorectal cancer.  Routine screening usually begins at age 67.  Direct examination of the colon should be repeated every 5-10 years through 31 years of age. However, you may need to be screened more often if early forms of precancerous polyps or small growths are found. Skin Cancer  Check your skin from head to toe regularly.  Tell your health care Julious Langlois about any new moles or changes in moles, especially if there is a change in a mole's shape or color.  Also tell your health care Houa Nie if you have a mole that is larger than the size of a pencil eraser.  Always use sunscreen. Apply sunscreen liberally and repeatedly throughout the day.  Protect yourself by wearing long sleeves, pants, a wide-brimmed hat, and sunglasses whenever you are outside. HEART DISEASE, DIABETES, AND HIGH BLOOD PRESSURE   Have your blood pressure checked at least every 1-2 years. High blood pressure causes heart disease and increases the risk of stroke.  If you are between 55 years and 79 years old, ask your health care Kalecia Hartney if you should take aspirin to prevent strokes.  Have regular diabetes screenings. This involves taking a blood sample to check  your fasting blood sugar level.  If you are at a normal weight and have a low risk for diabetes, have this test once every three years after 31 years of age.  If you are overweight and have a high risk for diabetes, consider being tested at a younger age or more often. PREVENTING INFECTION  Hepatitis B  If you have a higher risk for hepatitis B, you should be screened for this virus. You are considered at high risk for hepatitis B if:  You were born in a country where hepatitis B is common. Ask your health care Macintyre Alexa which countries are considered high risk.  Your parents were born in a high-risk country, and you have not been immunized against hepatitis B (hepatitis B vaccine).  You have HIV or AIDS.  You use needles to inject street drugs.  You live with someone who has hepatitis B.  You have had sex with someone who has hepatitis B.  You get hemodialysis treatment.  You take certain medicines for conditions, including cancer, organ transplantation, and autoimmune conditions. Hepatitis C  Blood testing is recommended for:  Everyone born from 1945 through 1965.  Anyone with known risk factors for hepatitis C. Sexually transmitted infections (STIs)  You should be screened for sexually transmitted infections (STIs) including gonorrhea and chlamydia if:  You are sexually active and are younger than 31 years of age.  You are older than 31 years of age and your health care Drevon Plog tells you that you are at risk for this type of infection.  Your sexual activity has changed since you were last screened and you are at an increased risk for chlamydia or gonorrhea. Ask your health care Vaidehi Braddy if you are at risk.  If you do not have HIV, but are at risk, it may be recommended that you take a prescription medicine daily to prevent HIV infection. This is called pre-exposure prophylaxis (PrEP). You are considered at risk if:  You are sexually active and do not regularly use  condoms or know the HIV status of your partner(s).  You take drugs by injection.  You are sexually active with a partner who has HIV. Talk with your health care Riku Buttery about whether you are at high risk of being infected with HIV. If you choose to begin PrEP, you should first be tested for HIV. You should then be   tested every 3 months for as long as you are taking PrEP.  PREGNANCY   If you are premenopausal and you may become pregnant, ask your health care Aneita Kiger about preconception counseling.  If you may become pregnant, take 400 to 800 micrograms (mcg) of folic acid every day.  If you want to prevent pregnancy, talk to your health care Pierra Skora about birth control (contraception). OSTEOPOROSIS AND MENOPAUSE   Osteoporosis is a disease in which the bones lose minerals and strength with aging. This can result in serious bone fractures. Your risk for osteoporosis can be identified using a bone density scan.  If you are 54 years of age or older, or if you are at risk for osteoporosis and fractures, ask your health care Keygan Dumond if you should be screened.  Ask your health care Sharisse Rantz whether you should take a calcium or vitamin D supplement to lower your risk for osteoporosis.  Menopause may have certain physical symptoms and risks.  Hormone replacement therapy may reduce some of these symptoms and risks. Talk to your health care [place] about whether hormone replacement therapy is right for you.  HOME CARE INSTRUCTIONS   Schedule regular health, dental, and eye exams.  Stay current with your immunizations.   Do not use any tobacco products including cigarettes, chewing tobacco, or electronic cigarettes.  If you are pregnant, do not drink alcohol.  If you are breastfeeding, limit how much and how often you drink alcohol.  Limit alcohol intake to no more than 1 drink per day for nonpregnant women. One drink equals 12 ounces of beer, 5 ounces of wine, or 1 ounces of hard  liquor.  Do not use street drugs.  Do not share needles.  Ask your health care Raef Sprigg for help if you need support or information about quitting drugs.  Tell your health care Cohl Behrens if you often feel depressed.  Tell your health care Bret Stamour if you have ever been abused or do not feel safe at home. Document Released: 06/25/2011 Document Revised: 04/26/2014 Document Reviewed: 11/11/2013 [facility] Patient Information 2015 [place], [place]. This information is not intended to replace advice given to you by your health care Dilia Alemany. Make sure you discuss any questions you have with your health care Fareeda Downard.

## 2015-09-21 NOTE — Assessment & Plan Note (Signed)
1) Anticipatory Guidance: Discussed importance of wearing a seatbelt while driving and not texting while driving; changing batteries in smoke detector at least once annually; wearing suntan lotion when outside; eating a balanced and moderate diet; getting physical activity at least 30 minutes per day.  2) Immunizations / Screenings / Labs:  Declined flu and tetanus today. All other immunizations are up-to-date per recommendations. All screenings are up-to-date per recommendations. Obtain CBC, CMET, Lipid profile and TSH.   Overall well exam. Risk factors for cardiovascular disease are minimal and related to sedentary lifestyle and overweight. Discussed and emphasized importance of consuming a nutrient dense diet that is moderate in size and a variety of fruits and vegetables while minimizing red meat intake and sugary/processed starches. Increase physical activity to 30 minutes most days a week. Continue other healthy lifestyle behaviors. Follow-up pending blood work. Follow-up prevention exam in one year. Well woman exam performed by gynecology.

## 2015-09-22 ENCOUNTER — Other Ambulatory Visit (INDEPENDENT_AMBULATORY_CARE_PROVIDER_SITE_OTHER): Payer: BLUE CROSS/BLUE SHIELD

## 2015-09-22 ENCOUNTER — Encounter: Payer: Self-pay | Admitting: Family

## 2015-09-22 DIAGNOSIS — Z0000 Encounter for general adult medical examination without abnormal findings: Secondary | ICD-10-CM

## 2015-09-22 DIAGNOSIS — R945 Abnormal results of liver function studies: Principal | ICD-10-CM

## 2015-09-22 DIAGNOSIS — R7989 Other specified abnormal findings of blood chemistry: Secondary | ICD-10-CM

## 2015-09-22 LAB — LIPID PANEL
CHOL/HDL RATIO: 3
Cholesterol: 131 mg/dL (ref 0–200)
HDL: 49.8 mg/dL (ref >39.00)
LDL Cholesterol: 73 mg/dL (ref 0–99)
NONHDL: 80.73
TRIGLYCERIDES: 39 mg/dL (ref 0.0–149.0)
VLDL: 7.8 mg/dL (ref 0.0–40.0)

## 2015-09-22 LAB — CBC
HCT: 40.8 % (ref 36.0–46.0)
Hemoglobin: 13.4 g/dL (ref 12.0–15.0)
MCHC: 32.9 g/dL (ref 30.0–36.0)
MCV: 88.9 fl (ref 78.0–100.0)
PLATELETS: 294 10*3/uL (ref 150.0–400.0)
RBC: 4.59 Mil/uL (ref 3.87–5.11)
RDW: 13.2 % (ref 11.5–15.5)
WBC: 3.2 10*3/uL — ABNORMAL LOW (ref 4.0–10.5)

## 2015-09-22 LAB — COMPREHENSIVE METABOLIC PANEL
ALBUMIN: 4.2 g/dL (ref 3.5–5.2)
ALT: 71 U/L — AB (ref 0–35)
AST: 57 U/L — AB (ref 0–37)
Alkaline Phosphatase: 38 U/L — ABNORMAL LOW (ref 39–117)
BILIRUBIN TOTAL: 0.5 mg/dL (ref 0.2–1.2)
BUN: 17 mg/dL (ref 6–23)
CALCIUM: 9.2 mg/dL (ref 8.4–10.5)
CHLORIDE: 104 meq/L (ref 96–112)
CO2: 27 meq/L (ref 19–32)
CREATININE: 0.95 mg/dL (ref 0.40–1.20)
GFR: 88.28 mL/min (ref >60.00)
Glucose, Bld: 90 mg/dL (ref 70–99)
Potassium: 4.2 mEq/L (ref 3.5–5.1)
Sodium: 139 mEq/L (ref 135–145)
Total Protein: 8.1 g/dL (ref 6.0–8.3)

## 2015-09-22 LAB — TSH: TSH: 2.18 u[IU]/mL (ref 0.35–4.50)

## 2015-09-29 ENCOUNTER — Other Ambulatory Visit (INDEPENDENT_AMBULATORY_CARE_PROVIDER_SITE_OTHER): Payer: BLUE CROSS/BLUE SHIELD

## 2015-09-29 DIAGNOSIS — R7989 Other specified abnormal findings of blood chemistry: Secondary | ICD-10-CM | POA: Diagnosis not present

## 2015-09-29 DIAGNOSIS — R945 Abnormal results of liver function studies: Principal | ICD-10-CM

## 2015-09-29 LAB — HEPATIC FUNCTION PANEL
ALT: 48 U/L — ABNORMAL HIGH (ref 0–35)
AST: 23 U/L (ref 0–37)
Albumin: 4.2 g/dL (ref 3.5–5.2)
Alkaline Phosphatase: 43 U/L (ref 39–117)
Bilirubin, Direct: 0.1 mg/dL (ref 0.0–0.3)
Total Bilirubin: 0.3 mg/dL (ref 0.2–1.2)
Total Protein: 7.9 g/dL (ref 6.0–8.3)

## 2015-10-02 ENCOUNTER — Encounter: Payer: Self-pay | Admitting: Family

## 2015-10-02 DIAGNOSIS — R7989 Other specified abnormal findings of blood chemistry: Secondary | ICD-10-CM

## 2015-10-02 DIAGNOSIS — R945 Abnormal results of liver function studies: Principal | ICD-10-CM

## 2015-12-20 ENCOUNTER — Other Ambulatory Visit: Payer: Self-pay | Admitting: Obstetrics

## 2015-12-20 NOTE — Telephone Encounter (Signed)
Please advise 

## 2015-12-21 ENCOUNTER — Telehealth: Payer: Self-pay | Admitting: *Deleted

## 2015-12-21 MED ORDER — METRONIDAZOLE 500 MG PO TABS: ORAL_TABLET | ORAL | Status: DC

## 2015-12-21 NOTE — Telephone Encounter (Signed)
Patient request refill of her Flagyl at the [facility] sent to pharmacy

## 2016-01-28 ENCOUNTER — Emergency Department (HOSPITAL_COMMUNITY): Payer: BC Managed Care – PPO

## 2016-01-28 ENCOUNTER — Encounter (HOSPITAL_COMMUNITY): Payer: Self-pay | Admitting: *Deleted

## 2016-01-28 ENCOUNTER — Emergency Department (HOSPITAL_COMMUNITY)
Admission: EM | Admit: 2016-01-28 | Discharge: 2016-01-28 | Disposition: A | Discharge status: Home / Self Care | Payer: BC Managed Care – PPO | Attending: Emergency Medicine | Admitting: Emergency Medicine

## 2016-01-28 DIAGNOSIS — Z86018 Personal history of other benign neoplasm: Secondary | ICD-10-CM | POA: Insufficient documentation

## 2016-01-28 DIAGNOSIS — M549 Dorsalgia, unspecified: Secondary | ICD-10-CM

## 2016-01-28 DIAGNOSIS — J45909 Unspecified asthma, uncomplicated: Secondary | ICD-10-CM | POA: Diagnosis not present

## 2016-01-28 DIAGNOSIS — F329 Major depressive disorder, single episode, unspecified: Secondary | ICD-10-CM | POA: Insufficient documentation

## 2016-01-28 DIAGNOSIS — Z79899 Other long term (current) drug therapy: Secondary | ICD-10-CM | POA: Diagnosis not present

## 2016-01-28 DIAGNOSIS — F419 Anxiety disorder, unspecified: Secondary | ICD-10-CM | POA: Diagnosis not present

## 2016-01-28 DIAGNOSIS — Z792 Long term (current) use of antibiotics: Secondary | ICD-10-CM | POA: Insufficient documentation

## 2016-01-28 DIAGNOSIS — M546 Pain in thoracic spine: Secondary | ICD-10-CM | POA: Insufficient documentation

## 2016-01-28 DIAGNOSIS — R079 Chest pain, unspecified: Secondary | ICD-10-CM | POA: Insufficient documentation

## 2016-01-28 LAB — I-STAT TROPONIN, ED: Troponin i, poc: 0.01 ng/mL (ref 0.00–0.08)

## 2016-01-28 LAB — BASIC METABOLIC PANEL
Anion gap: 9 (ref 5–15)
BUN: 18 mg/dL (ref 6–20)
CALCIUM: 9.3 mg/dL (ref 8.9–10.3)
CO2: 23 mmol/L (ref 22–32)
Chloride: 108 mmol/L (ref 101–111)
Creatinine, Ser: 1.03 mg/dL — ABNORMAL HIGH (ref 0.44–1.00)
GFR calc Af Amer: >60 mL/min (ref >60)
GLUCOSE: 88 mg/dL (ref 65–99)
Potassium: 4.1 mmol/L (ref 3.5–5.1)
Sodium: 140 mmol/L (ref 135–145)

## 2016-01-28 LAB — CBC
HEMATOCRIT: 40.7 % (ref 36.0–46.0)
HEMOGLOBIN: 13.6 g/dL (ref 12.0–15.0)
MCH: 30 pg (ref 26.0–34.0)
MCHC: 33.4 g/dL (ref 30.0–36.0)
MCV: 89.6 fL (ref 78.0–100.0)
Platelets: 273 10*3/uL (ref 150–400)
RBC: 4.54 MIL/uL (ref 3.87–5.11)
RDW: 12.4 % (ref 11.5–15.5)
WBC: 6 10*3/uL (ref 4.0–10.5)

## 2016-01-28 MED ORDER — METHOCARBAMOL 500 MG PO TABS: 500.0000 mg | ORAL_TABLET | Freq: Two times a day (BID) | ORAL | Status: DC

## 2016-01-28 MED ORDER — METHOCARBAMOL 500 MG PO TABS
500.0000 mg | ORAL_TABLET | Freq: Once | ORAL | Status: AC
  Administered 2016-01-28: 500 mg via ORAL
  Filled 2016-01-28: qty 1

## 2016-01-28 MED ORDER — PREDNISONE 20 MG PO TABS: ORAL_TABLET | ORAL | Status: DC

## 2016-01-28 MED ORDER — OXYCODONE-ACETAMINOPHEN 5-325 MG PO TABS
1.0000 | ORAL_TABLET | Freq: Once | ORAL | Status: AC
  Administered 2016-01-28: 1 via ORAL
  Filled 2016-01-28: qty 1

## 2016-01-28 MED ORDER — DIAZEPAM 5 MG PO TABS
5.0000 mg | ORAL_TABLET | Freq: Once | ORAL | Status: AC
  Administered 2016-01-28: 5 mg via ORAL
  Filled 2016-01-28: qty 1

## 2016-01-28 MED ORDER — PREDNISONE 20 MG PO TABS
60.0000 mg | ORAL_TABLET | Freq: Once | ORAL | Status: AC
  Administered 2016-01-28: 60 mg via ORAL
  Filled 2016-01-28: qty 3

## 2016-01-28 NOTE — Discharge Instructions (Signed)
Take the prescribed medication as directed. °Follow-up with your primary care physician. °Return to the ED for new or worsening symptoms. ° °

## 2016-01-28 NOTE — ED Notes (Addendum)
Pt complains of pain her lower back since this morning, chest pain since this morning. Pt states the pain in her chest radiates to her upper back and shoulders. Pt denies injury to her back or chest. PT states the pain in her chest is worse when taking deep breaths. Pt states she took flexeril at 1130am today, which she states did not help. Pt states she has felt increased stress since moving to [place] this month.

## 2016-01-28 NOTE — ED Provider Notes (Signed)
CSN: [id number]     Arrival date & time 01/28/16  1832 History   First MD Initiated Contact with Patient 01/28/16 1912     Chief Complaint  Patient presents with  . Back Pain  . Chest Pain     (Consider location/radiation/quality/duration/timing/severity/associated sxs/prior Treatment) Patient is a 30 y.o. female presenting with back pain and chest pain. The history is provided by the patient and medical records.  Back Pain Associated symptoms: chest pain   Chest Pain Associated symptoms: back pain     29 y.o. F with hx of anxiety, depression, asthma, presenting to the ED for chest and back pain, onset this morning and has been constant throughout the day.  Patient states she woke up this morning and had pain in her left shoulder blade/back.  She states the area is very sore and tense, seems worse with movement of the left arm and very deep breathing.  She states pain has now started migrating to her left shoulder and left side of chest.  Denies any injury, trauma, or falls.  She denies shortness of breath, palpitations, dizziness, weakness, diaphoresis, nausea, or vomiting.  No personal or family cardiac hx.  No recent travel, prolonged immobilization, leg swelling, or hx of DVT/PE.  Not currently on exogenous estrogens.  Patient is not a smoker.  She did take a flexeril earlier today which did not help.  Of note, patient reports she is under increased stress as she has recently moved to [place] but is still in a transition period. VSS.   Past Medical History  Diagnosis Date  . Abnormal Pap smear   . Anxiety   . Depression   . History of uterine fibroid   . Asthma     childhood   Past Surgical History  Procedure Laterality Date  . Colposcopy w/ biopsy / curettage     Family History  Problem Relation Age of Onset  . Hypertension Mother   . Depression Mother   . Deafness Mother   . Asthma Father   . Alcohol abuse Father   . Hearing loss Sister 0    both parents, sister,  grandparents were all born deaf  . Hypertension Maternal Grandmother   . Depression Maternal Grandmother   . Diabetes Maternal Grandmother   . Anxiety disorder Maternal Grandmother   . Deafness Maternal Grandfather    Social History  Substance Use Topics  . Smoking status: Never Smoker   . Smokeless tobacco: Never Used  . Alcohol Use: 0.0 oz/week    0 Standard drinks or equivalent per week     Comment: social    OB History    Gravida Para Term Preterm AB TAB SAB Ectopic Multiple Living   0 2 0 1 1 0 3     Review of Systems  Cardiovascular: Positive for chest pain.  Musculoskeletal: Positive for back pain.  All other systems reviewed and are negative.     Allergies  Review of patient's allergies indicates no known allergies.  Home Medications   Prior to Admission medications   Medication Sig Start Date End Date Taking? Authorizing Provider  citalopram (CELEXA) 20 MG tablet Take 1 tablet (20 mg total) by mouth daily. 09/08/15   Brock Bad, MD  cyclobenzaprine (FLEXERIL) 10 MG tablet Take 10 mg by mouth 3 (three) times daily as needed for muscle spasms.    Historical Provider, MD  metroNIDAZOLE (FLAGYL) 500 MG tablet TAKE 1 TABLET (500 MG TOTAL) BY MOUTH 2 (  TWO) TIMES DAILY. 12/21/15   Brock Bad, MD  Prenatal Vit-Fe Fumarate-FA (PRENATAL PLUS/IRON) 27-1 MG TABS Take 1 tablet by mouth daily before breakfast. 09/08/15   Brock Bad, MD  zolpidem (AMBIEN) 5 MG tablet Take 1 tablet (5 mg total) by mouth at bedtime as needed for sleep. 09/08/15   Brock Bad, MD   BP 129/79 mmHg  Pulse 71  Temp(Src) 99 F (37.2 C) (Oral)  Resp 16  SpO2 100%  LMP 01/04/2016   Physical Exam  Constitutional: She is oriented to person, place, and time. She appears well-developed and well-nourished. No distress.  HENT:  Head: Normocephalic and atraumatic.  Mouth/Throat: Oropharynx is clear and moist.  Eyes: Conjunctivae and EOM are normal. Pupils are equal, round,  and reactive to light.  Neck: Normal range of motion. Neck supple.  Cardiovascular: Normal rate, regular rhythm and normal heart sounds.   Pulmonary/Chest: Effort normal and breath sounds normal. No respiratory distress. She has no wheezes.  Abdominal: Soft. Bowel sounds are normal. There is no tenderness. There is no guarding.  Musculoskeletal: Normal range of motion. She exhibits no edema.  Reproducible pain of left mid-back and left shoulder blade with palpation and movement of left arm; no bony deformities or swelling noted; limited ROM of left shoulder due to poor patient effort; normal grip strength; arm remains neurovascularly intact  Neurological: She is alert and oriented to person, place, and time.  Skin: Skin is warm and dry. She is not diaphoretic.  Psychiatric: Her mood appears anxious.  Appears anxious  Nursing note and vitals reviewed.   ED Course  Procedures (including critical care time) Labs Review Labs Reviewed  BASIC METABOLIC PANEL - Abnormal; Notable for the following:    Creatinine, Ser 1.03 (*)    All other components within normal limits  CBC  I-STAT TROPOININ, ED    Imaging Review Dg Chest 2 View  01/28/2016  CLINICAL DATA:  Mid chest pain for 1 day. EXAM: CHEST  2 VIEW COMPARISON:  03/23/2010 FINDINGS: The cardiomediastinal contours are normal. The lungs are clear. Pulmonary vasculature is normal. No consolidation, pleural effusion, or pneumothorax. No acute osseous abnormalities are seen. IMPRESSION: No acute pulmonary process. Electronically Signed   By: Rubye Oaks M.D.   On: 01/28/2016 19:28   I have personally reviewed and evaluated these images and lab results as part of my medical decision-making.   EKG Interpretation None      MDM   Final diagnoses:  Chest pain, unspecified chest pain type  Back pain, unspecified location   30 year old female here with left upper back pain, onset this morning and is now migrating to her left shoulder  and left chest. Pain has been constant throughout the day. Patient is afebrile, nontoxic but she does appear anxious on exam. Her pain is reproducible with palpation and movement of her left arm. Patient has no known cardiac history. Her EKG is nonischemic. Vitals are stable on room air.  Feel that her symptoms are very atypical for ACS, PE, dissection, the acute cardiac event. Patient has a very low risk heart score of 0, PERC negative.  Nonetheless, will obtain labs and CXR to ensure no abnormalities.  Dose of percocet and valium given.  Patient's labwork is reassuring. Her vital signs remained stable on room air. She reports she still has significant pain, however she is freely moving around in bed, has eaten entire meal from [employer] provided by her husband, and has been able to ambulate  independently to the restroom without difficulty.  Patient was given additional prednisone and robaxin here. At this time given ongoing symptoms with negative workup, I feel patient stable for discharge.  Rx prednisone taper and robaxin for home.  FU with PCP.  Discussed plan with patient, he/she acknowledged understanding and agreed with plan of care.  Return precautions given for new or worsening symptoms.  Garlon Hatchet, PA-C 01/28/16 [id number]  Margarita Grizzle, MD 01/30/16 1351

## 2016-05-25 ENCOUNTER — Ambulatory Visit (HOSPITAL_COMMUNITY)
Admission: EM | Admit: 2016-05-25 | Discharge: 2016-05-25 | Disposition: A | Discharge status: Home / Self Care | Payer: Medicaid Other | Attending: Emergency Medicine | Admitting: Emergency Medicine

## 2016-05-25 ENCOUNTER — Encounter (HOSPITAL_COMMUNITY): Payer: Self-pay | Admitting: Emergency Medicine

## 2016-05-25 DIAGNOSIS — H109 Unspecified conjunctivitis: Secondary | ICD-10-CM

## 2016-05-25 MED ORDER — TRAMADOL-ACETAMINOPHEN 37.5-325 MG PO TABS: 1.0000 | ORAL_TABLET | Freq: Four times a day (QID) | ORAL | Status: DC | PRN

## 2016-05-25 MED ORDER — KETOTIFEN FUMARATE 0.025 % OP SOLN: 1.0000 [drp] | Freq: Two times a day (BID) | OPHTHALMIC | Status: DC

## 2016-05-25 MED ORDER — POLYMYXIN B-TRIMETHOPRIM 10000-0.1 UNIT/ML-% OP SOLN: 1.0000 [drp] | OPHTHALMIC | Status: DC

## 2016-05-25 NOTE — ED Provider Notes (Signed)
CSN: [id number]     Arrival date & time 05/25/16  1930 History   First MD Initiated Contact with Patient 05/25/16 1937     Chief Complaint  Patient presents with  . Conjunctivitis   (Consider location/radiation/quality/duration/timing/severity/associated sxs/prior Treatment) HPI Comments: 30 year old female complaining of left eye pain and redness and irritation since early morning. She says it is causing a localized headache around the eye. Denies any known drainage. Denies known foreign bodies.   Patient is a 30 y.o. female presenting with conjunctivitis.  Conjunctivitis    Past Medical History  Diagnosis Date  . Abnormal Pap smear   . Anxiety   . Depression   . History of uterine fibroid   . Asthma     childhood   Past Surgical History  Procedure Laterality Date  . Colposcopy w/ biopsy / curettage     Family History  Problem Relation Age of Onset  . Hypertension Mother   . Depression Mother   . Deafness Mother   . Asthma Father   . Alcohol abuse Father   . Hearing loss Sister 0    both parents, sister, grandparents were all born deaf  . Hypertension Maternal Grandmother   . Depression Maternal Grandmother   . Diabetes Maternal Grandmother   . Anxiety disorder Maternal Grandmother   . Deafness Maternal Grandfather    Social History  Substance Use Topics  . Smoking status: Never Smoker   . Smokeless tobacco: Never Used  . Alcohol Use: 0.0 oz/week    0 Standard drinks or equivalent per week     Comment: social    OB History    Gravida Para Term Preterm AB TAB SAB Ectopic Multiple Living   5 3 3  0 2 0 1 1 0 3     Review of Systems  Constitutional: Negative.  Negative for fever and fatigue.  HENT: Negative.   Eyes: Positive for photophobia, pain and redness. Negative for discharge.  Respiratory: Negative.   All other systems reviewed and are negative.   Allergies  Review of patient's allergies indicates no known allergies.  Home Medications   Prior to  Admission medications   Medication Sig Start Date End Date Taking? Authorizing Provider  cyclobenzaprine (FLEXERIL) 10 MG tablet Take 10 mg by mouth 3 (three) times daily as needed for muscle spasms.    Historical Provider, MD  ketotifen (ZADITOR) 0.025 % ophthalmic solution Place 1 drop into the left eye 2 (two) times daily. 05/25/16   Hayden Rasmussenavid Sylis Ketchum, NP  methocarbamol (ROBAXIN) 500 MG tablet Take 1 tablet (500 mg total) by mouth 2 (two) times daily. 01/28/16   Garlon HatchetLisa M Sanders, PA-C  predniSONE (DELTASONE) 20 MG tablet Take 40 mg by mouth daily for 3 days, then 20mg  by mouth daily for 3 days, then 10mg  daily for 3 days 01/28/16   Garlon HatchetLisa M Sanders, PA-C  traMADol-acetaminophen (ULTRACET) 37.5-325 MG tablet Take 1 tablet by mouth every 6 (six) hours as needed. 05/25/16   Hayden Rasmussenavid Mickle Campton, NP  trimethoprim-polymyxin b (POLYTRIM) ophthalmic solution Place 1 drop into the left eye every 4 (four) hours. 05/25/16   Hayden Rasmussenavid Christyn Gutkowski, NP   Meds Ordered and Administered this Visit  Medications - No data to display  BP 148/76 mmHg  Pulse 83  Temp(Src) 98.2 F (36.8 C) (Oral)  Resp 18  SpO2 97%  LMP 05/18/2016 No data found.   Physical Exam  Constitutional: She is oriented to person, place, and time. She appears well-developed and well-nourished. No distress.  Eyes: EOM are normal. Pupils are equal, round, and reactive to light.  Mild lower conjunctival erythema and minor swelling. Conjunctiva erythema to the upper lid. Mild scleral injection. Anterior chambers clear. Examination under magnification reveals no apparent defect, asymmetry or foreign body.  Neck: Normal range of motion. Neck supple.  Neurological: She is alert and oriented to person, place, and time. She exhibits normal muscle tone.  Skin: Skin is warm and dry.  Psychiatric: She has a normal mood and affect.  Nursing note and vitals reviewed.   ED Course  Procedures (including critical care time)  Labs Review Labs Reviewed - No data to display  Imaging  Review No results found.   Visual Acuity Review  Right Eye Distance: 20/25 w/contacts Left Eye Distance: 20/25 w/contacts Bilateral Distance: 20/25 w/contacts  Right Eye Near:   Left Eye Near:    Bilateral Near:         MDM   1. Conjunctivitis of left eye    Meds ordered this encounter  Medications  . trimethoprim-polymyxin b (POLYTRIM) ophthalmic solution    Sig: Place 1 drop into the left eye every 4 (four) hours.    Dispense:  10 mL    Refill:  0    Order Specific Question:  Supervising Provider    Answer:  Charm Rings [id number]  . ketotifen (ZADITOR) 0.025 % ophthalmic solution    Sig: Place 1 drop into the left eye 2 (two) times daily.    Dispense:  5 mL    Refill:  0    Order Specific Question:  Supervising Provider    Answer:  Charm Rings [id number]  . traMADol-acetaminophen (ULTRACET) 37.5-325 MG tablet    Sig: Take 1 tablet by mouth every 6 (six) hours as needed.    Dispense:  10 tablet    Refill:  0    Order Specific Question:  Supervising Provider    Answer:  Micheline Chapman       Hayden Rasmussen, NP 05/25/16 [phone]

## 2016-05-25 NOTE — ED Notes (Addendum)
Here for poss left pink eye onset last night... Sx include: blurred vision, redness, irritation, and sensitive to light Denies inj/trauma... Reports she sleeps w/contacts A&O x4... No acute distress.

## 2016-05-25 NOTE — Discharge Instructions (Signed)
How to Use Eye Drops and Eye Ointments  HOW TO APPLY EYE DROPS  Follow these steps when applying eye drops:  1. Wash your hands.  2. Tilt your head back.  3. Put a finger under your eye and use it to gently pull your lower lid downward. Keep that finger in place.  4. Using your other hand, hold the dropper between your thumb and index finger.  5. Position the dropper just over the edge of the lower lid. Hold it as close to your eye as you can without touching the dropper to your eye.  6. Steady your hand. One way to do this is to lean your index finger against your brow.  7. Look up.  8. Slowly and gently squeeze one drop of medicine into your eye.  9. Close your eye.  10. Place a finger between your lower eyelid and your nose. Press gently for 2 minutes. This increases the amount of time that the medicine is exposed to the eye. It also reduces side effects that can develop if the drop gets into the bloodstream through the nose.  HOW TO APPLY EYE OINTMENTS  Follow these steps when applying eye ointments:  1. Wash your hands.  2. Put a finger under your eye and use it to gently pull your lower lid downward. Keep that finger in place.  3. Using your other hand, place the tip of the tube between your thumb and index finger with the remaining fingers braced against your cheek or nose.  4. Hold the tube just over the edge of your lower lid without touching the tube to your lid or eyeball.  5. Look up.  6. Line the inner part of your lower lid with ointment.  7. Gently pull up on your upper lid and look down. This will force the ointment to spread over the surface of the eye.  8. Release the upper lid.  9. If you can, close your eyes for 1-2 minutes.  Do not rub your eyes. If you applied the ointment correctly, your vision will be blurry for a few minutes. This is normal.  ADDITIONAL INFORMATION   Make sure to use the eye drops or ointment as told by your health care provider.   If you have been told to use both eye  drops and an eye ointment, apply the eye drops first, then wait 3-4 minutes before you apply the ointment.   Try not to touch the tip of the dropper or tube to your eye. A dropper or tube that has touched the eye can become contaminated.     This information is not intended to replace advice given to you by your health care provider. Make sure you discuss any questions you have with your health care provider.     Document Released: 03/18/2001 Document Revised: 04/26/2015 Document Reviewed: 12/06/2014  Elsevier Interactive Patient Education 2016 [employer]

## 2016-08-29 ENCOUNTER — Encounter: Payer: Self-pay | Admitting: Family Medicine

## 2016-08-29 ENCOUNTER — Telehealth: Payer: Self-pay | Admitting: Family

## 2016-08-29 NOTE — Telephone Encounter (Signed)
Pt calling to see if she can [facility]. [place] [facility] [place] and would like to have her care here with Dr Milinda CaveMcGowen.

## 2016-08-29 NOTE — Telephone Encounter (Signed)
OK with me.

## 2016-08-30 NOTE — Telephone Encounter (Signed)
Ok with me 

## 2016-08-30 NOTE — Telephone Encounter (Signed)
Patient scheduled to see Dr Milinda CaveMcGowen 09/07/16 @ 1pm.

## 2016-09-01 ENCOUNTER — Encounter (HOSPITAL_COMMUNITY): Payer: Self-pay | Admitting: Emergency Medicine

## 2016-09-01 ENCOUNTER — Emergency Department (HOSPITAL_COMMUNITY)
Admission: EM | Admit: 2016-09-01 | Discharge: 2016-09-01 | Disposition: A | Discharge status: Home / Self Care | Payer: Medicaid Other | Attending: Emergency Medicine | Admitting: Emergency Medicine

## 2016-09-01 ENCOUNTER — Emergency Department (HOSPITAL_COMMUNITY): Payer: Medicaid Other

## 2016-09-01 DIAGNOSIS — R0600 Dyspnea, unspecified: Secondary | ICD-10-CM

## 2016-09-01 DIAGNOSIS — J45909 Unspecified asthma, uncomplicated: Secondary | ICD-10-CM | POA: Diagnosis not present

## 2016-09-01 DIAGNOSIS — R079 Chest pain, unspecified: Secondary | ICD-10-CM | POA: Diagnosis present

## 2016-09-01 DIAGNOSIS — R072 Precordial pain: Secondary | ICD-10-CM | POA: Diagnosis not present

## 2016-09-01 DIAGNOSIS — R0789 Other chest pain: Secondary | ICD-10-CM

## 2016-09-01 HISTORY — DX: Other chest pain: R07.89

## 2016-09-01 LAB — CBC
HCT: 41.4 % (ref 36.0–46.0)
HEMOGLOBIN: 13.6 g/dL (ref 12.0–15.0)
MCH: 29.5 pg (ref 26.0–34.0)
MCHC: 32.9 g/dL (ref 30.0–36.0)
MCV: 89.8 fL (ref 78.0–100.0)
PLATELETS: 271 10*3/uL (ref 150–400)
RBC: 4.61 MIL/uL (ref 3.87–5.11)
RDW: 12.5 % (ref 11.5–15.5)
WBC: 3.8 10*3/uL — ABNORMAL LOW (ref 4.0–10.5)

## 2016-09-01 LAB — I-STAT TROPONIN, ED: Troponin i, poc: 0 ng/mL (ref 0.00–0.08)

## 2016-09-01 LAB — BASIC METABOLIC PANEL
Anion gap: 6 (ref 5–15)
BUN: 9 mg/dL (ref 6–20)
CHLORIDE: 102 mmol/L (ref 101–111)
CO2: 25 mmol/L (ref 22–32)
CREATININE: 0.93 mg/dL (ref 0.44–1.00)
Calcium: 9.1 mg/dL (ref 8.9–10.3)
GFR calc non Af Amer: >60 mL/min (ref >60)
GLUCOSE: 84 mg/dL (ref 65–99)
Potassium: 4 mmol/L (ref 3.5–5.1)
Sodium: 133 mmol/L — ABNORMAL LOW (ref 135–145)

## 2016-09-01 LAB — D-DIMER, QUANTITATIVE: D-Dimer, Quant: 0.48 ug/mL-FEU (ref 0.00–0.50)

## 2016-09-01 NOTE — ED Provider Notes (Signed)
[facility]-EMERGENCY DEPT Provider Note   CSN: [id number] Arrival date & time: 09/01/16  1154     History   Chief Complaint Chief Complaint  Patient presents with  . Chest Pain    HPI Valerie Robbins is a 30 y.o. female.  The history is provided by the patient.  Chest Pain   Associated symptoms include shortness of breath. Pertinent negatives include no abdominal pain, no back pain, no cough and no headaches.  Patient presents with right-sided chest pain. Begins in her mid chest and goes to the right side. Began yesterday. Worse with breathing. Feels a little shortness of breath also. No fevers or chills. No cough. No swelling of her legs. She does not smoke cigarettes. The pain is dull. No nausea vomiting. No vaginal bleeding or discharge. Denies possibly pregnancy.  Past Medical History:  Diagnosis Date  . Abnormal Pap smear   . Anxiety   . Asthma    childhood  . Depression   . History of uterine fibroid   . Metrorrhagia    with pelvic pain.  Transvag+transabd pelvic u/s NORMAL 05/2014.  Marland Kitchen. Recurrent genital herpes     Patient Active Problem List   Diagnosis Date Noted  . Routine general medical examination at a health care facility 09/21/2015  . Spasm of back muscles 09/08/2014  . Vaginitis 09/08/2014  . BV (bacterial vaginosis) 09/03/2014  . Vaginitis and vulvovaginitis, unspecified 12/29/2013  . Screening examination for venereal disease 12/29/2013  . Bartholin cyst 09/25/2013  . Routine screening for STI (sexually transmitted infection) 09/04/2013  . Anxiety associated with depression 06/01/2013  . Unspecified symptom associated with female genital organs 04/22/2013  . Headache(784.0) 08/27/2012  . Generalized muscle ache 08/27/2012  . Geographic tongue 01/22/2012  . Annual physical exam 01/22/2012  . Depression 01/22/2012    Past Surgical History:  Procedure Laterality Date  . COLPOSCOPY W/ BIOPSY / CURETTAGE      OB History    Gravida Para Term  Preterm AB Living   5 3 3  0 2 3   SAB TAB Ectopic Multiple Live Births   1 0 1 0 3       Home Medications    Prior to Admission medications   Medication Sig Start Date End Date Taking? Authorizing Provider  cyclobenzaprine (FLEXERIL) 10 MG tablet Take 10 mg by mouth 3 (three) times daily as needed for muscle spasms.    Historical Provider, MD  ketotifen (ZADITOR) 0.025 % ophthalmic solution Place 1 drop into the left eye 2 (two) times daily. 05/25/16   Hayden Rasmussenavid Mabe, NP  methocarbamol (ROBAXIN) 500 MG tablet Take 1 tablet (500 mg total) by mouth 2 (two) times daily. 01/28/16   Garlon HatchetLisa M Sanders, PA-C  predniSONE (DELTASONE) 20 MG tablet Take 40 mg by mouth daily for 3 days, then 20mg  by mouth daily for 3 days, then 10mg  daily for 3 days 01/28/16   Garlon HatchetLisa M Sanders, PA-C  traMADol-acetaminophen (ULTRACET) 37.5-325 MG tablet Take 1 tablet by mouth every 6 (six) hours as needed. 05/25/16   Hayden Rasmussenavid Mabe, NP  trimethoprim-polymyxin b (POLYTRIM) ophthalmic solution Place 1 drop into the left eye every 4 (four) hours. 05/25/16   Hayden Rasmussenavid Mabe, NP    Family History Family History  Problem Relation Age of Onset  . Hypertension Mother   . Depression Mother   . Deafness Mother   . Asthma Father   . Alcohol abuse Father   . Hypertension Maternal Grandmother   . Depression Maternal Grandmother   .  Diabetes Maternal Grandmother   . Anxiety disorder Maternal Grandmother   . Deafness Maternal Grandfather   . Hearing loss Sister 0    both parents, sister, grandparents were all born deaf    Social History Social History  Substance Use Topics  . Smoking status: Never Smoker  . Smokeless tobacco: Never Used  . Alcohol use 0.0 oz/week     Comment: social      Allergies   Review of patient's allergies indicates no known allergies.   Review of Systems Review of Systems  Constitutional: Negative for appetite change and fatigue.  HENT: Negative for congestion.   Respiratory: Positive for shortness of breath.  Negative for cough, choking and chest tightness.   Cardiovascular: Positive for chest pain.  Gastrointestinal: Negative for abdominal pain.  Genitourinary: Negative for dyspareunia.  Musculoskeletal: Negative for back pain.  Skin: Negative for wound.  Neurological: Negative for headaches.  Hematological: Negative for adenopathy.     Physical Exam Updated Vital Signs BP 118/79 (BP Location: Right Arm)   Pulse 69   Temp 98.7 F (37.1 C) (Oral)   Resp 20   Ht 5\' 5"  (1.651 m)   Wt 186 lb (84.4 kg)   LMP 08/31/2016   SpO2 100%   BMI 30.95 kg/m   Physical Exam  Constitutional: She appears well-developed.  HENT:  Head: Atraumatic.  Neck: Neck supple.  Cardiovascular: Normal rate.   Pulmonary/Chest: Effort normal.  Mild tenderness to the xiphoid to right lower chest area.  Abdominal: Soft.  Musculoskeletal: Normal range of motion.  Neurological: She is alert.  Skin: Skin is warm.     ED Treatments / Results  Labs (all labs ordered are listed, but only abnormal results are displayed) Labs Reviewed  CBC - Abnormal; Notable for the following:       Result Value   WBC 3.8 (*)    All other components within normal limits  BASIC METABOLIC PANEL  D-DIMER, QUANTITATIVE (NOT AT [facility])  Rosezena Sensor, ED    EKG  EKG Interpretation  Date/Time:  Saturday September 01 2016 11:57:55 EDT Ventricular Rate:  79 PR Interval:  138 QRS Duration: 80 QT Interval:  384 QTC Calculation: 440 R Axis:   84 Text Interpretation:  Normal sinus rhythm with sinus arrhythmia Right atrial enlargement Borderline ECG Confirmed by Rubin Payor  MD, Harrold Donath [phone]) on 09/01/2016 12:13:52 PM       Radiology No results found.  Procedures Procedures (including critical care time)  Medications Ordered in ED Medications - No data to display   Initial Impression / Assessment and Plan / ED Course  I have reviewed the triage vital signs and the nursing notes.  Pertinent labs & imaging results  that were available during my care of the patient were reviewed by me and considered in my medical decision making (see chart for details).  Clinical Course    Patient with chest pain. Some shortness of breath. EKG reassuring. D-dimer negative. Troponin negative. X-ray reassuring. A cardiac cause or pulmonary embolism. Not hypoxic. Will discharge home. No abdominal tenderness.  Final Clinical Impressions(s) / ED Diagnoses   Final diagnoses:  None    New Prescriptions New Prescriptions   No medications on file     Benjiman Core, MD 09/01/16 1528

## 2016-09-01 NOTE — ED Triage Notes (Signed)
Developed  Central chest pain that radiates into rt. Breast.   Describes as having a hard time taking a full breath.  She has also started taking Phentermine yesterday and she also has been drinking coffee which is new for her.  She denies any n/v/d.    Skin is warm and dry.   She has  Been under stress this week.

## 2016-09-07 ENCOUNTER — Ambulatory Visit (INDEPENDENT_AMBULATORY_CARE_PROVIDER_SITE_OTHER): Payer: Medicaid Other | Admitting: Family Medicine

## 2016-09-07 ENCOUNTER — Encounter: Payer: Self-pay | Admitting: Family Medicine

## 2016-09-07 VITALS — BP 129/75 | HR 64 | Temp 98.7°F | Resp 16 | Ht 65.5 in | Wt 182.8 lb

## 2016-09-07 DIAGNOSIS — H905 Unspecified sensorineural hearing loss: Secondary | ICD-10-CM | POA: Diagnosis not present

## 2016-09-07 DIAGNOSIS — R740 Nonspecific elevation of levels of transaminase and lactic acid dehydrogenase [LDH]: Secondary | ICD-10-CM

## 2016-09-07 DIAGNOSIS — R7401 Elevation of levels of liver transaminase levels: Secondary | ICD-10-CM

## 2016-09-07 DIAGNOSIS — Z0000 Encounter for general adult medical examination without abnormal findings: Secondary | ICD-10-CM | POA: Diagnosis not present

## 2016-09-07 NOTE — Progress Notes (Signed)
Office Note 09/07/2016  CC:  Chief Complaint  Patient presents with  . Establish Care   HPI:  Valerie Robbins-Draft is a [age] y.o.  female who is here to transfer care/get CPE. Patient's most recent primary MD: Jeanine LuzGregory Calone, NP with [facility].  Her GYN is Dr. Clearance CootsHarper and her Pap/pelvic is UTD. Old records in [facility] EMR were reviewed prior to or during today's visit.  Of note, pt states she was recently seen at a wt loss clinic, says "I just walked in and they rx'd me phentermine". She has exercise regimen planned and has f/u planned with them 09/2016.  She has routine GYN f/u with Dr. Clearance CootsHarper next week.  We discussed her recent ED visit for atypical CP 09/01/16: all w/u neg. She feels certain that it was due to stress, says she has had " a lot going on" last couple months. At the very end of the visit she asked "what do you have here for stress".  I had to tell her that she would need to make a f/u office visit to adequately discuss this problem.  She expressed understanding.  She has FH of congenital deafness.  She has no hx of hearing impairment herself, but says she sometimes feels like her ears briefly feel "muffled".  She asks for referral to audiologist today.    Past Medical History:  Diagnosis Date  . Abnormal Pap smear   . Anxiety and depression   . Asthma    childhood  . Atypical chest pain 09/01/2016   ED visit: +chest wall TTP.  D-dimer neg, TnI neg, CXR neg, EKG nl, no hypoxia.  Marland Kitchen. History of uterine fibroid   . Metrorrhagia    with pelvic pain.  Transvag+transabd pelvic u/s NORMAL 05/2014.  Marland Kitchen. Recurrent genital herpes    HSV 2    Past Surgical History:  Procedure Laterality Date  . COLPOSCOPY W/ BIOPSY / CURETTAGE      Family History  Problem Relation Age of Onset  . Hypertension Mother   . Depression Mother   . Deafness Mother   . Asthma Father   . Alcohol abuse Father   . Hypertension Maternal Grandmother   . Depression Maternal Grandmother   .  Diabetes Maternal Grandmother   . Anxiety disorder Maternal Grandmother   . Deafness Maternal Grandfather   . Hearing loss Sister 0    both parents, sister, grandparents were all born deaf    Social History   Social History  . Marital status: Single    Spouse name: N/A  . Number of children: 3  . Years of education: 14   Occupational History  . Not on file.   Social History Main Topics  . Smoking status: Never Smoker  . Smokeless tobacco: Never Used  . Alcohol use 0.0 oz/week     Comment: social   . Drug use: No  . Sexual activity: Not Currently    Partners: Male    Birth control/ protection: None     Comment: Last encounter 3 months ago   Other Topics Concern  . Not on file   Social History Narrative   Single, 3 sons.   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   No exercise.   Fun: Sherri RadHang out with her children.   Denies abuse and feels safe at home.     Outpatient Encounter Prescriptions as of 09/07/2016  Medication Sig  . phentermine (ADIPEX-P) 37.5 MG tablet  Take 37.5 mg by mouth daily.  . [DISCONTINUED] ketotifen (ZADITOR) 0.025 % ophthalmic solution Place 1 drop into the left eye 2 (two) times daily. (Patient not taking: Reported on 09/01/2016)  . [DISCONTINUED] methocarbamol (ROBAXIN) 500 MG tablet Take 1 tablet (500 mg total) by mouth 2 (two) times daily. (Patient not taking: Reported on 09/01/2016)  . [DISCONTINUED] traMADol-acetaminophen (ULTRACET) 37.5-325 MG tablet Take 1 tablet by mouth every 6 (six) hours as needed. (Patient not taking: Reported on 09/01/2016)  . [DISCONTINUED] trimethoprim-polymyxin b (POLYTRIM) ophthalmic solution Place 1 drop into the left eye every 4 (four) hours. (Patient not taking: Reported on 09/01/2016)   No facility-administered encounter medications on file as of 09/07/2016.     No Known Allergies  ROS Review of Systems  Constitutional: Negative for appetite change, chills, fatigue and fever.   HENT: Negative for congestion, dental problem, ear pain and sore throat.   Eyes: Negative for discharge, redness and visual disturbance.  Respiratory: Negative for cough, chest tightness, shortness of breath and wheezing.   Cardiovascular: Negative for chest pain, palpitations and leg swelling.  Gastrointestinal: Negative for abdominal pain, blood in stool, diarrhea, nausea and vomiting.  Genitourinary: Negative for difficulty urinating, dysuria, flank pain, frequency, hematuria and urgency.  Musculoskeletal: Negative for arthralgias, back pain, joint swelling, myalgias and neck stiffness.  Skin: Negative for pallor and rash.  Neurological: Negative for dizziness, speech difficulty, weakness and headaches.  Hematological: Negative for adenopathy. Does not bruise/bleed easily.  Psychiatric/Behavioral: Negative for confusion and sleep disturbance. The patient is not nervous/anxious.     PE; Blood pressure 129/75, pulse 64, temperature 98.7 F (37.1 C), temperature source Oral, resp. rate 16, height 5' 5.5" (1.664 m), weight 182 lb 12.8 oz (82.9 kg), last menstrual period 08/31/2016, SpO2 100 %. Pt examined with Judie Grieve, nurse, as chaperone. Gen: Alert, well appearing.  Patient is oriented to person, place, time, and situation. AFFECT: pleasant, lucid thought and speech. ENT: Ears: EACs clear, normal epithelium.  TMs with good light reflex and landmarks bilaterally.  Eyes: no injection, icteris, swelling, or exudate.  EOMI, PERRLA. Nose: no drainage or turbinate edema/swelling.  No injection or focal lesion.  Mouth: lips without lesion/swelling.  Oral mucosa pink and moist.  Dentition intact and without obvious caries or gingival swelling.  Oropharynx without erythema, exudate, or swelling.  Neck: supple/nontender.  No LAD, mass, or TM.  Carotid pulses 2+ bilaterally, without bruits. CV: RRR, no m/r/g.   LUNGS: CTA bilat, nonlabored resps, good aeration in all lung fields. ABD: soft,  NT, ND, BS normal.  No hepatospenomegaly or mass.  No bruits. EXT: no clubbing, cyanosis, or edema.  Musculoskeletal: no joint swelling, erythema, warmth, or tenderness.  ROM of all joints intact. Skin - no sores or suspicious lesions or rashes or color changes  Pertinent labs:  Lab Results  Component Value Date   TSH 2.18 09/22/2015   Lab Results  Component Value Date   WBC 3.8 (L) 09/01/2016   HGB 13.6 09/01/2016   HCT 41.4 09/01/2016   MCV 89.8 09/01/2016   PLT 271 09/01/2016   Lab Results  Component Value Date   CREATININE 0.93 09/01/2016   BUN 9 09/01/2016   NA 133 (L) 09/01/2016   K 4.0 09/01/2016   CL 102 09/01/2016   CO2 25 09/01/2016   Lab Results  Component Value Date   ALT 48 (H) 09/29/2015   AST 23 09/29/2015   ALKPHOS 43 09/29/2015   BILITOT 0.3 09/29/2015   Lab  Results  Component Value Date   CHOL 131 09/22/2015   Lab Results  Component Value Date   HDL 49.80 09/22/2015   Lab Results  Component Value Date   LDLCALC 73 09/22/2015   Lab Results  Component Value Date   TRIG 39.0 09/22/2015   Lab Results  Component Value Date   CHOLHDL 3 09/22/2015    ASSESSMENT AND PLAN:   Transfer pt;   Health maintenance exam: Reviewed age and gender appropriate health maintenance issues (prudent diet, regular exercise, health risks of tobacco and excessive alcohol, use of seatbelts, fire alarms in home, use of sunscreen).  Also reviewed age and gender appropriate health screening as well as vaccine recommendations. Vaccine hx unclear: she says she got a vaccine in a building near [facility] but doesn't know which one---her record is showing she is due for Tdap.  We'll research this and get back to her about this. She declined flu vaccine today.  Has hx of mildly elevated ALT in 09/2015, so I've ordered hepatic panel future. Also ordered screening lipid panel and TSH future b/c pt not fasting today. She had recent CBC and BMET which were normal.  She  goes to GYN in 1 wk.  Referred pt to audiologist for FH of congenital deafness-per her request today.  An After Visit Summary was printed and given to the patient.  Return in about 1 year (around 09/07/2017) for annual CPE (fasting)--needs appt at her convenience for lab visit (fasting).  Signed:  Santiago Bumpers, MD           09/07/2016

## 2016-09-12 ENCOUNTER — Telehealth: Payer: Self-pay | Admitting: *Deleted

## 2016-09-12 NOTE — Telephone Encounter (Signed)
Patient has BV symptoms.Flagyl 500 mg 1 tablet twice a day x 7 days called to [facility] [address] 336 [phone]

## 2016-09-16 ENCOUNTER — Encounter: Payer: Self-pay | Admitting: Family Medicine

## 2016-09-17 ENCOUNTER — Ambulatory Visit: Payer: Self-pay | Admitting: Obstetrics

## 2016-09-19 ENCOUNTER — Ambulatory Visit (INDEPENDENT_AMBULATORY_CARE_PROVIDER_SITE_OTHER): Payer: Medicaid Other | Admitting: Obstetrics

## 2016-09-19 ENCOUNTER — Encounter: Payer: Self-pay | Admitting: Obstetrics

## 2016-09-19 VITALS — BP 121/78 | HR 83 | Wt 179.0 lb

## 2016-09-19 DIAGNOSIS — Z01419 Encounter for gynecological examination (general) (routine) without abnormal findings: Secondary | ICD-10-CM

## 2016-09-19 DIAGNOSIS — Z0000 Encounter for general adult medical examination without abnormal findings: Secondary | ICD-10-CM | POA: Diagnosis not present

## 2016-09-19 NOTE — Progress Notes (Signed)
Subjective:        Valerie Robbins is a 30 y.o. female here for a routine exam.  Current complaints: none.    Personal health questionnaire:  Is patient Ashkenazi Jewish, have a family history of breast and/or ovarian cancer: no Is there a family history of uterine cancer diagnosed at age < 25, gastrointestinal cancer, urinary tract cancer, family member who is a Personnel officer syndrome-associated carrier: no Is the patient overweight and hypertensive, family history of diabetes, personal history of gestational diabetes, preeclampsia or PCOS: no Is patient over 64, have PCOS,  family history of premature CHD under age 41, diabetes, smoke, have hypertension or peripheral artery disease:  no At any time, has a partner hit, kicked or otherwise hurt or frightened you?: no Over the past 2 weeks, have you felt down, depressed or hopeless?: no Over the past 2 weeks, have you felt little interest or pleasure in doing things?:no   Gynecologic History Patient's last menstrual period was 08/31/2016. Contraception: none Last Pap: 2016. Results were: normal Last mammogram: n/a. Results were: n/a  Obstetric History OB History  Gravida Para Term Preterm AB Living  5 3 3  0 2 3  SAB TAB Ectopic Multiple Live Births  1 0 1 0 3    # Outcome Date GA Lbr Len/2nd Weight Sex Delivery Anes PTL Lv  5 Term 03/02/12 [redacted]w[redacted]d 06:45 / 00:02 7 lb 8.6 oz (3.42 kg) M Vag-Vacuum EPI  LIV     Birth Comments: WNL  4 SAB           3 Ectopic           2 Term     M Vag-Spont   LIV  1 Term     M Vag-Spont  Y LIV      Past Medical History:  Diagnosis Date  . Abnormal Pap smear   . Anxiety and depression   . Asthma    childhood  . Atypical chest pain 09/01/2016   ED visit: +chest wall TTP.  D-dimer neg, TnI neg, CXR neg, EKG nl, no hypoxia.  . Family history of congenital hearing loss    Audiology eval at [facility] normal.  . History of uterine fibroid   . Metrorrhagia    with pelvic pain.  Transvag+transabd  pelvic u/s NORMAL 05/2014.  Marland Kitchen Recurrent genital herpes    HSV 2    Past Surgical History:  Procedure Laterality Date  . COLPOSCOPY W/ BIOPSY / CURETTAGE       Current Outpatient Prescriptions:  .  phentermine (ADIPEX-P) 37.5 MG tablet, Take 37.5 mg by mouth daily., Disp: , Rfl: 0 No Known Allergies  Social History  Substance Use Topics  . Smoking status: Never Smoker  . Smokeless tobacco: Never Used  . Alcohol use 0.0 oz/week     Comment: social     Family History  Problem Relation Age of Onset  . Hypertension Mother   . Depression Mother   . Deafness Mother   . Asthma Father   . Alcohol abuse Father   . Hypertension Maternal Grandmother   . Depression Maternal Grandmother   . Diabetes Maternal Grandmother   . Anxiety disorder Maternal Grandmother   . Deafness Maternal Grandfather   . Hearing loss Sister 0    both parents, sister, grandparents were all born deaf      Review of Systems  Constitutional: negative for fatigue and weight loss Respiratory: negative for cough and wheezing Cardiovascular: negative for chest  pain, fatigue and palpitations Gastrointestinal: negative for abdominal pain and change in bowel habits Musculoskeletal:negative for myalgias Neurological: negative for gait problems and tremors Behavioral/Psych: negative for abusive relationship, depression Endocrine: negative for temperature intolerance   Genitourinary:negative for abnormal menstrual periods, genital lesions, hot flashes, sexual problems and vaginal discharge Integument/breast: negative for breast lump, breast tenderness, nipple discharge and skin lesion(s)    Objective:       BP 121/78   Pulse 83   Wt 179 lb (81.2 kg)   LMP 08/31/2016   BMI 29.33 kg/m  General:   alert  Skin:   no rash or abnormalities  Lungs:   clear to auscultation bilaterally  Heart:   regular rate and rhythm, S1, S2 normal, no murmur, click, rub or gallop  Breasts:   normal without suspicious masses,  skin or nipple changes or axillary nodes  Abdomen:  normal findings: no organomegaly, soft, non-tender and no hernia  Pelvis:  External genitalia: normal general appearance Urinary system: urethral meatus normal and bladder without fullness, nontender Vaginal: normal without tenderness, induration or masses Cervix: normal appearance Adnexa: normal bimanual exam Uterus: anteverted and non-tender, normal size   Lab Review Urine pregnancy test Labs reviewed yes Radiologic studies reviewed no   50% of 20 min visit spent on counseling and coordination of care.   Assessment:    Healthy female exam.    Contraceptive counseling and advice   Plan:    Education reviewed: calcium supplements, depression evaluation, low fat, low cholesterol diet, safe sex/STD prevention, skin cancer screening and weight bearing exercise. Follow up in: 1 year.   No orders of the defined types were placed in this encounter.  No orders of the defined types were placed in this encounter.

## 2016-09-22 LAB — NUSWAB VG+, CANDIDA 6SP
CANDIDA ALBICANS, NAA: NEGATIVE
CANDIDA PARAPSILOSIS, NAA: NEGATIVE
CANDIDA TROPICALIS, NAA: NEGATIVE
Candida glabrata, NAA: NEGATIVE
Candida krusei, NAA: NEGATIVE
Candida lusitaniae, NAA: NEGATIVE
Chlamydia trachomatis, NAA: NEGATIVE
Neisseria gonorrhoeae, NAA: NEGATIVE
Trich vag by NAA: NEGATIVE

## 2016-09-23 LAB — PAP IG AND HPV HIGH-RISK
HPV, HIGH-RISK: NEGATIVE
PAP SMEAR COMMENT: 0

## 2016-09-25 ENCOUNTER — Telehealth: Payer: Self-pay | Admitting: Obstetrics

## 2016-09-25 NOTE — Telephone Encounter (Signed)
Pt called to office for lab results. Spoke with pt. Reviewed results from last visit with pt. Pt has no other concerns.

## 2016-09-25 NOTE — Telephone Encounter (Signed)
Patient called wanting a call back stated someone called her and she was returning the phone call.

## 2016-10-29 ENCOUNTER — Encounter (HOSPITAL_COMMUNITY): Payer: Self-pay | Admitting: Emergency Medicine

## 2016-10-29 ENCOUNTER — Telehealth: Payer: Self-pay | Admitting: *Deleted

## 2016-10-29 ENCOUNTER — Emergency Department (HOSPITAL_COMMUNITY)
Admission: EM | Admit: 2016-10-29 | Discharge: 2016-10-29 | Disposition: A | Discharge status: Home / Self Care | Payer: Medicaid Other | Attending: Emergency Medicine | Admitting: Emergency Medicine

## 2016-10-29 ENCOUNTER — Other Ambulatory Visit: Payer: Self-pay | Admitting: Obstetrics

## 2016-10-29 ENCOUNTER — Emergency Department (HOSPITAL_COMMUNITY): Payer: Medicaid Other

## 2016-10-29 DIAGNOSIS — M79674 Pain in right toe(s): Secondary | ICD-10-CM | POA: Diagnosis present

## 2016-10-29 DIAGNOSIS — M62830 Muscle spasm of back: Secondary | ICD-10-CM

## 2016-10-29 DIAGNOSIS — J45909 Unspecified asthma, uncomplicated: Secondary | ICD-10-CM | POA: Insufficient documentation

## 2016-10-29 MED ORDER — CYCLOBENZAPRINE HCL 10 MG PO TABS: 10.0000 mg | ORAL_TABLET | Freq: Three times a day (TID) | ORAL | 1 refills | Status: DC | PRN

## 2016-10-29 NOTE — ED Notes (Signed)
Little MD at bedside and ortho technician at bedside.

## 2016-10-29 NOTE — Telephone Encounter (Signed)
Flexeril Rx

## 2016-10-29 NOTE — ED Provider Notes (Signed)
[facility]-EMERGENCY DEPT Provider Note   CSN: [id number] Arrival date & time: 10/29/16  [id number]     History   Chief Complaint Chief Complaint  Patient presents with  . Toe Pain    HPI Valerie Robbins is a 30 y.o. female.  30 year old female who presents with right great toe pain. Approximately 4-5 months ago, the patient tripped and landed funny on her right great toe. She has been having pain ever since then and the pain seems to be getting worse. It was initially around her great toe MTP joint but now feels like it involves the ball of her foot and the distal part of the top of her foot as well. She denies any further trauma or falls. No fevers or recent illness. She has not seen an orthopedist for her symptoms.    The history is provided by the patient.  Toe Pain     Past Medical History:  Diagnosis Date  . Abnormal Pap smear   . Anxiety and depression   . Asthma    childhood  . Atypical chest pain 09/01/2016   ED visit: +chest wall TTP.  D-dimer neg, TnI neg, CXR neg, EKG nl, no hypoxia.  . Family history of congenital hearing loss    [facility]  . History of uterine fibroid   . Metrorrhagia    with pelvic pain.  Transvag+transabd pelvic u/s NORMAL 05/2014.  Marland Kitchen. Recurrent genital herpes    HSV 2    Patient Active Problem List   Diagnosis Date Noted  . Routine general medical examination at a health care facility 09/21/2015  . Spasm of back muscles 09/08/2014  . Vaginitis 09/08/2014  . BV (bacterial vaginosis) 09/03/2014  . Vaginitis and vulvovaginitis, unspecified 12/29/2013  . Screening examination for venereal disease 12/29/2013  . Bartholin cyst 09/25/2013  . Routine screening for STI (sexually transmitted infection) 09/04/2013  . Anxiety associated with depression 06/01/2013  . Unspecified symptom associated with female genital organs 04/22/2013  . Headache(784.0) 08/27/2012  . Generalized muscle ache 08/27/2012  . Geographic tongue  01/22/2012  . Annual physical exam 01/22/2012  . Depression 01/22/2012    Past Surgical History:  Procedure Laterality Date  . COLPOSCOPY W/ BIOPSY / CURETTAGE      OB History    Gravida Para Term Preterm AB Living   5 3 3  0 2 3   SAB TAB Ectopic Multiple Live Births   1 0 1 0 3       Home Medications    Prior to Admission medications   Not on File    Family History Family History  Problem Relation Age of Onset  . Hypertension Mother   . Depression Mother   . Deafness Mother   . Asthma Father   . Alcohol abuse Father   . Hypertension Maternal Grandmother   . Depression Maternal Grandmother   . Diabetes Maternal Grandmother   . Anxiety disorder Maternal Grandmother   . Deafness Maternal Grandfather   . Hearing loss Sister 0    both parents, sister, grandparents were all born deaf    Social History Social History  Substance Use Topics  . Smoking status: Never Smoker  . Smokeless tobacco: Never Used  . Alcohol use 0.0 oz/week     Comment: social      Allergies   Patient has no known allergies.   Review of Systems Review of Systems 10 Systems reviewed and are negative for acute change except as noted  in the HPI.   Physical Exam Updated Vital Signs BP 126/85 (BP Location: Right Arm)   Pulse 69   Temp 98.2 F (36.8 C) (Oral)   Resp 18   Ht 5\' 5"  (1.651 m)   Wt 175 lb (79.4 kg)   LMP 10/21/2016   SpO2 100%   BMI 29.12 kg/m   Physical Exam  Constitutional: She is oriented to person, place, and time. She appears well-developed and well-nourished. No distress.  HENT:  Head: Normocephalic and atraumatic.  Eyes: Conjunctivae are normal.  Neck: Neck supple.  Musculoskeletal: She exhibits no edema or deformity.  Pain with medial and lateral deviation of toe at MTP joint, normal flexion and extension R great toe, no obvious joint swelling; 2+ DP pulses  Neurological: She is alert and oriented to person, place, and time.  Skin: Skin is warm and dry.  Capillary refill takes less than 2 seconds. No rash noted. No erythema.  Psychiatric: She has a normal mood and affect. Judgment normal.  Nursing note and vitals reviewed.    ED Treatments / Results  Labs (all labs ordered are listed, but only abnormal results are displayed) Labs Reviewed - No data to display  EKG  EKG Interpretation None       Radiology Dg Foot Complete Right  Result Date: 10/29/2016 CLINICAL DATA:  Pt reports continued pain post treatment for "jammed toe" 4-5 months ago. Pt reports hurts with weight bearing to right great toe. EXAM: RIGHT FOOT COMPLETE - 3+ VIEW COMPARISON:  None. FINDINGS: There is no evidence of fracture or dislocation. There is no evidence of arthropathy or other focal bone abnormality. Soft tissues are unremarkable. IMPRESSION: Negative. Electronically Signed   By: Corlis Leak  Hassell M.D.   On: 10/29/2016 10:49   Dg Toe Great Right  Result Date: 10/29/2016 CLINICAL DATA:  Pt reports continued pain post treatment for "jammed toe" 4-5 months ago. Pt reports hurts with weight bearing to right great toe. EXAM: RIGHT GREAT TOE COMPARISON:  None. FINDINGS: There is no evidence of fracture or dislocation. There is no evidence of arthropathy or other focal bone abnormality. Soft tissues are unremarkable. IMPRESSION: Negative. Electronically Signed   By: Corlis Leak  Hassell M.D.   On: 10/29/2016 10:49    Procedures Procedures (including critical care time)  Medications Ordered in ED Medications - No data to display   Initial Impression / Assessment and Plan / ED Course  I have reviewed the triage vital signs and the nursing notes.  Pertinent imaging results that were available during my care of the patient were reviewed by me and considered in my medical decision making (see chart for details).  Clinical Course    4-5 mo of R great toe pain after "jamming" toe. Neurovasc intact. Plain films negative. Placed in cam walker to prevent flexion at ball of foot.  Provided w/ ortho f/u info and discussed supportive care.  Final Clinical Impressions(s) / ED Diagnoses   Final diagnoses:  None    New Prescriptions New Prescriptions   No medications on file     Laurence Spatesachel Morgan Feras Gardella, MD 10/29/16 1100

## 2016-10-29 NOTE — ED Triage Notes (Signed)
Pt reports continued pain post treatment for "jammed toe" 4-5 months ago. Pt reports hurts with weight bearing to right great toe.

## 2016-10-29 NOTE — Telephone Encounter (Signed)
Patient is requesting a refill of the Flexeril Dr Clearance Cootsharper had written for her in the past. She uses it for back spasms and cramping.

## 2016-11-29 ENCOUNTER — Ambulatory Visit (INDEPENDENT_AMBULATORY_CARE_PROVIDER_SITE_OTHER): Payer: Medicaid Other | Admitting: Family Medicine

## 2016-11-29 ENCOUNTER — Encounter: Payer: Self-pay | Admitting: Family Medicine

## 2016-11-29 VITALS — BP 125/85 | HR 73 | Temp 98.0°F | Resp 20 | Wt 184.2 lb

## 2016-11-29 DIAGNOSIS — H6591 Unspecified nonsuppurative otitis media, right ear: Secondary | ICD-10-CM | POA: Diagnosis not present

## 2016-11-29 DIAGNOSIS — J029 Acute pharyngitis, unspecified: Secondary | ICD-10-CM

## 2016-11-29 LAB — POCT RAPID STREP A (OFFICE): Rapid Strep A Screen: NEGATIVE

## 2016-11-29 MED ORDER — AMOXICILLIN-POT CLAVULANATE 875-125 MG PO TABS: 1.0000 | ORAL_TABLET | Freq: Two times a day (BID) | ORAL | 0 refills | Status: DC

## 2016-11-29 MED ORDER — FLUCONAZOLE 150 MG PO TABS: 150.0000 mg | ORAL_TABLET | Freq: Once | ORAL | 0 refills | Status: AC

## 2016-11-29 NOTE — Progress Notes (Signed)
Valerie Robbins , 06/08/1986, 30 y.o., female MRN: [id number] Patient Care Team    Relationship Specialty Notifications Start End  Jeoffrey MassedPhilip H McGowen, MD PCP - General Family Medicine  09/07/16   Brock Badharles A Harper, MD Consulting Physician Obstetrics and Gynecology  08/29/16   Flo ShanksKarol Wolicki, MD Consulting Physician Otolaryngology  09/16/16     CC: sore throat Subjective: Pt presents for an acute OV with complaints of sore throat of 2 days duration.  Associated symptoms include swollen glands, right ear pain, productive cough, bloody nose. She has felt "hot'. She denies nausea, vomit, diarrhea, shortness of breath. Two of her children are ill. She has not tried anything for her symptoms.   No Known Allergies Social History  Substance Use Topics  . Smoking status: Never Smoker  . Smokeless tobacco: Never Used  . Alcohol use 0.0 oz/week     Comment: social    Past Medical History:  Diagnosis Date  . Abnormal Pap smear   . Anxiety and depression   . Asthma    childhood  . Atypical chest pain 09/01/2016   ED visit: +chest wall TTP.  D-dimer neg, TnI neg, CXR neg, EKG nl, no hypoxia.  . Family history of congenital hearing loss    [facility]  . History of uterine fibroid   . Metrorrhagia    with pelvic pain.  Transvag+transabd pelvic u/s NORMAL 05/2014.  Marland Kitchen. Recurrent genital herpes    HSV 2   Past Surgical History:  Procedure Laterality Date  . COLPOSCOPY W/ BIOPSY / CURETTAGE     Family History  Problem Relation Age of Onset  . Hypertension Mother   . Depression Mother   . Deafness Mother   . Asthma Father   . Alcohol abuse Father   . Hypertension Maternal Grandmother   . Depression Maternal Grandmother   . Diabetes Maternal Grandmother   . Anxiety disorder Maternal Grandmother   . Deafness Maternal Grandfather   . Hearing loss Sister 0    both parents, sister, grandparents were all born deaf     Medication List       Accurate as of  11/29/16 11:39 AM. Always use your most recent med list.          cyclobenzaprine 10 MG tablet Commonly known as:  FLEXERIL Take 1 tablet (10 mg total) by mouth every 8 (eight) hours as needed for muscle spasms.   phentermine 37.5 MG tablet Commonly known as:  ADIPEX-P Take 37.5 mg by mouth daily.       Results for orders placed or performed in visit on 11/29/16 (from the past 24 hour(s))  POCT rapid strep A     Status: Normal   Collection Time: 11/29/16 11:25 AM  Result Value Ref Range   Rapid Strep A Screen Negative Negative   No results found.   ROS: Negative, with the exception of above mentioned in HPI  Objective:  BP 125/85 (BP Location: Right Arm, Patient Position: Sitting, Cuff Size: Normal)   Pulse 73   Temp 98 F (36.7 C)   Resp 20   Wt 184 lb 4 oz (83.6 kg)   SpO2 98%   BMI 30.66 kg/m  Body mass index is 30.66 kg/m. Gen: Afebrile. No acute distress. Nontoxic in appearance, well developed, well nourished.  HENT: AT. [place]. Bilateral TM visualized, left TM normal. Right TM with moderate erythema and bulging membrane. MMM, no oral lesions. Bilateral nares with erythema and swelling.  Throat without erythema or exudates. No cough or hoarseness on exam. Eyes:Pupils Equal Round Reactive to light, Extraocular movements intact,  Conjunctiva without redness, discharge or icterus. Neck/lymp/endocrine: Supple,right tender cervical  Lymphadenopathy present CV: RRR  Chest: CTAB, no wheeze or crackles. Good air movement, normal resp effort.  Abd: Soft. NTND. BS present.  Neuro:  Normal gait. PERLA. EOMi. Alert. Oriented x3   Results for orders placed or performed in visit on 11/29/16 (from the past 24 hour(s))  POCT rapid strep A     Status: Normal   Collection Time: 11/29/16 11:25 AM  Result Value Ref Range   Rapid Strep A Screen Negative Negative    Assessment/Plan: Valerie Robbins is a 30 y.o. female present for acute OV for  Right non-suppurative otitis  media/Sore throat - POCT rapid strep A--> Negative - rest and hydrate - Right TM red and bulging. - amoxicillin-clavulanate (AUGMENTIN) 875-125 MG tablet; Take 1 tablet by mouth 2 (two) times daily.  Dispense: 20 tablet; Refill: 0 - diflucan to be used if she needs - F/U PRN  > 25 minutes spent with patient, >50% of time spent face to face   electronically signed by:  Felix Pacinienee Kuneff, DO  [facility] - [place]

## 2016-11-29 NOTE — Patient Instructions (Signed)
Augmentin every 12 hours for 10 days.  I will call in diflucan just in case you start a yeast infection with antibiotic use.    Otitis Media, Adult Otitis media is redness, soreness, and puffiness (swelling) in the space just behind your eardrum (middle ear). It may be caused by allergies or infection. It often happens along with a cold. Follow these instructions at home:  Take your medicine as told. Finish it even if you start to feel better.  Only take over-the-counter or prescription medicines for pain, discomfort, or fever as told by your doctor.  Follow up with your doctor as told. Contact a doctor if:  You have otitis media only in one ear, or bleeding from your nose, or both.  You notice a lump on your neck.  You are not getting better in 3-5 days.  You feel worse instead of better. Get help right away if:  You have pain that is not helped with medicine.  You have puffiness, redness, or pain around your ear.  You get a stiff neck.  You cannot move part of your face (paralysis).  You notice that the bone behind your ear hurts when you touch it. This information is not intended to replace advice given to you by your health care provider. Make sure you discuss any questions you have with your health care provider. Document Released: 05/28/2008 Document Revised: 05/17/2016 Document Reviewed: 07/07/2013 Elsevier Interactive Patient Education  2017 [employer]

## 2017-01-02 ENCOUNTER — Encounter: Payer: Medicaid Other | Admitting: Student

## 2017-02-25 ENCOUNTER — Other Ambulatory Visit: Payer: Self-pay | Admitting: Family Medicine

## 2017-02-25 DIAGNOSIS — J029 Acute pharyngitis, unspecified: Secondary | ICD-10-CM

## 2017-02-25 DIAGNOSIS — H6591 Unspecified nonsuppurative otitis media, right ear: Secondary | ICD-10-CM

## 2017-02-25 NOTE — Telephone Encounter (Signed)
Patient's ear is starting to bother her again from ear-ache and the pharmacy will be calling in a refill for rx amoxicillin-clavulanate (AUGMENTIN) 875-125 MG tablet to  [facility] - [place], [place] - [address] [phone] (Phone) [phone] (Fax)   Please call patient with any questions at [phone].

## 2017-02-25 NOTE — Telephone Encounter (Signed)
Patient needs appt we do not refill antibiotics scheduled patient to be seen by Dr Milinda CaveMcGowen for evaluation

## 2017-02-26 ENCOUNTER — Ambulatory Visit: Payer: Medicaid Other | Admitting: Family Medicine

## 2017-02-26 NOTE — Progress Notes (Deleted)
OFFICE VISIT  02/26/2017   CC: No chief complaint on file.    HPI:    Patient is a 31 y.o.  female who presents for ear pain.  Past Medical History:  Diagnosis Date  . Abnormal Pap smear   . Anxiety and depression   . Asthma    childhood  . Atypical chest pain 09/01/2016   ED visit: +chest wall TTP.  D-dimer neg, TnI neg, CXR neg, EKG nl, no hypoxia.  . Family history of congenital hearing loss    [facility]  . History of uterine fibroid   . Metrorrhagia    with pelvic pain.  Transvag+transabd pelvic u/s NORMAL 05/2014.  Marland Kitchen. Recurrent genital herpes    HSV 2    Past Surgical History:  Procedure Laterality Date  . COLPOSCOPY W/ BIOPSY / CURETTAGE      Outpatient Medications Prior to Visit  Medication Sig Dispense Refill  . amoxicillin-clavulanate (AUGMENTIN) 875-125 MG tablet Take 1 tablet by mouth 2 (two) times daily. 20 tablet 0  . cyclobenzaprine (FLEXERIL) 10 MG tablet Take 1 tablet (10 mg total) by mouth every 8 (eight) hours as needed for muscle spasms. 30 tablet 1  . phentermine (ADIPEX-P) 37.5 MG tablet Take 37.5 mg by mouth daily.  0   No facility-administered medications prior to visit.     No Known Allergies  ROS As per HPI  PE: There were no vitals taken for this visit. ***  LABS:  ***  IMPRESSION AND PLAN:  No problem-specific Assessment & Plan notes found for this encounter.   FOLLOW UP: No Follow-up on file.

## 2017-06-10 ENCOUNTER — Encounter: Payer: Self-pay | Admitting: Obstetrics

## 2017-06-10 ENCOUNTER — Other Ambulatory Visit (HOSPITAL_COMMUNITY)
Admission: RE | Admit: 2017-06-10 | Discharge: 2017-06-10 | Disposition: A | Discharge status: Home / Self Care | Payer: Medicaid Other | Source: Ambulatory Visit | Attending: Obstetrics | Admitting: Obstetrics

## 2017-06-10 ENCOUNTER — Ambulatory Visit (INDEPENDENT_AMBULATORY_CARE_PROVIDER_SITE_OTHER): Payer: Medicaid Other | Admitting: Obstetrics

## 2017-06-10 VITALS — BP 138/82 | HR 80 | Ht 65.0 in | Wt 182.2 lb

## 2017-06-10 DIAGNOSIS — R109 Unspecified abdominal pain: Secondary | ICD-10-CM

## 2017-06-10 DIAGNOSIS — Z3202 Encounter for pregnancy test, result negative: Secondary | ICD-10-CM

## 2017-06-10 DIAGNOSIS — L0232 Furuncle of buttock: Secondary | ICD-10-CM | POA: Diagnosis not present

## 2017-06-10 DIAGNOSIS — N898 Other specified noninflammatory disorders of vagina: Secondary | ICD-10-CM | POA: Diagnosis not present

## 2017-06-10 DIAGNOSIS — A6000 Herpesviral infection of urogenital system, unspecified: Secondary | ICD-10-CM

## 2017-06-10 LAB — POCT URINE PREGNANCY: PREG TEST UR: NEGATIVE

## 2017-06-10 LAB — POCT URINALYSIS DIPSTICK
BILIRUBIN UA: NEGATIVE
Glucose, UA: NEGATIVE
Ketones, UA: NEGATIVE
Leukocytes, UA: NEGATIVE
NITRITE UA: NEGATIVE
PH UA: 6 (ref 5.0–8.0)
Protein, UA: NEGATIVE
RBC UA: NEGATIVE
Spec Grav, UA: 1.02 (ref 1.010–1.025)
UROBILINOGEN UA: 0.2 U/dL

## 2017-06-10 MED ORDER — VALACYCLOVIR HCL 500 MG PO TABS: 500.0000 mg | ORAL_TABLET | Freq: Two times a day (BID) | ORAL | 99 refills | Status: DC

## 2017-06-10 MED ORDER — AMOXICILLIN-POT CLAVULANATE 875-125 MG PO TABS: 1.0000 | ORAL_TABLET | Freq: Two times a day (BID) | ORAL | 0 refills | Status: DC

## 2017-06-10 NOTE — Progress Notes (Signed)
Pt c/o rectal boil x couple days ago. Pt also c/o abdominal cramps.

## 2017-06-10 NOTE — Progress Notes (Signed)
Patient ID: Valerie Robbins, female   DOB: Mar 25, 1986, 31 y.o.   MRN: [id number]  Chief Complaint  Patient presents with  . Abdominal Cramping    HPI Valerie Robbins is a 31 y.o. female.  Abdominal cramping this past month, not associated with period.  Also has sore between buttocks that appeared this month.  She has history of genital herpes.  Has not been sexually active for a year.  Her herpes lesion was vulva in location.  This sore is non tender, but it did appear the month before last, and then healed in a few days.  It did reappear this month in the same spot. HPI  Past Medical History:  Diagnosis Date  . Abnormal Pap smear   . Anxiety and depression   . Asthma    childhood  . Atypical chest pain 09/01/2016   ED visit: +chest wall TTP.  D-dimer neg, TnI neg, CXR neg, EKG nl, no hypoxia.  . Family history of congenital hearing loss    Audiology eval at [facility] normal.  . History of uterine fibroid   . Metrorrhagia    with pelvic pain.  Transvag+transabd pelvic u/s NORMAL 05/2014.  Marland Kitchen Recurrent genital herpes    HSV 2    Past Surgical History:  Procedure Laterality Date  . COLPOSCOPY W/ BIOPSY / CURETTAGE      Family History  Problem Relation Age of Onset  . Hypertension Mother   . Depression Mother   . Deafness Mother   . Asthma Father   . Alcohol abuse Father   . Hypertension Maternal Grandmother   . Depression Maternal Grandmother   . Diabetes Maternal Grandmother   . Anxiety disorder Maternal Grandmother   . Deafness Maternal Grandfather   . Hearing loss Sister 0       both parents, sister, grandparents were all born deaf    Social History Social History  Substance Use Topics  . Smoking status: Never Smoker  . Smokeless tobacco: Never Used  . Alcohol use 0.0 oz/week     Comment: social     No Known Allergies  Current Outpatient Prescriptions  Medication Sig Dispense Refill  . phentermine (ADIPEX-P) 37.5 MG tablet Take 37.5 mg by mouth  daily.  0  . amoxicillin-clavulanate (AUGMENTIN) 875-125 MG tablet Take 1 tablet by mouth 2 (two) times daily. 14 tablet 0  . cyclobenzaprine (FLEXERIL) 10 MG tablet Take 1 tablet (10 mg total) by mouth every 8 (eight) hours as needed for muscle spasms. 30 tablet 1  . valACYclovir (VALTREX) 500 MG tablet Take 1 tablet (500 mg total) by mouth 2 (two) times daily. Take bid for 3 days prn each outbreak. 30 tablet prn   No current facility-administered medications for this visit.     Review of Systems Review of Systems Constitutional: negative for fatigue and weight loss Respiratory: negative for cough and wheezing Cardiovascular: negative for chest pain, fatigue and palpitations Gastrointestinal: negative for abdominal pain and change in bowel habits Genitourinary:negative Integument/breast: positive for ? sore between buttocks Musculoskeletal:negative for myalgias Neurological: negative for gait problems and tremors Behavioral/Psych: negative for abusive relationship, depression Endocrine: negative for temperature intolerance      Blood pressure 138/82, pulse 80, height 5\' 5"  (1.651 m), weight 182 lb 3.2 oz (82.6 kg), last menstrual period 05/23/2017.  Physical Exam Physical Exam           Skin:  Small ulceration between cheeks of buttocks, non tender Abdomen:  normal findings: no organomegaly,  soft, non-tender and no hernia  Pelvis:  External genitalia: normal general appearance Urinary system: urethral meatus normal and bladder without fullness, nontender Vaginal: normal without tenderness, induration or masses Cervix: normal appearance Adnexa: normal bimanual exam Uterus: anteverted and non-tender, normal size    50% of 15 min visit spent on counseling and coordination of care.    Data Reviewed Previous Herpes cultures  Assessment     1. Abdominal pain, unspecified abdominal location Rx: - POCT urine pregnancy - POCT urinalysis dipstick  2. Boil of buttock Rx: -  amoxicillin-clavulanate (AUGMENTIN) 875-125 MG tablet; Take 1 tablet by mouth 2 (two) times daily.  Dispense: 14 tablet; Refill: 0  3. Genital herpes simplex history, vulva site Rx: - valACYclovir (VALTREX) 500 MG tablet; Take 1 tablet (500 mg total) by mouth 2 (two) times daily. Take bid for 3 days prn each outbreak.  Dispense: 30 tablet; Refill: prn  4. Vaginal discharge Rx: - Cervicovaginal ancillary only    Plan    Follow up prn   Orders Placed This Encounter  Procedures  . POCT urine pregnancy  . POCT urinalysis dipstick   Meds ordered this encounter  Medications  . amoxicillin-clavulanate (AUGMENTIN) 875-125 MG tablet    Sig: Take 1 tablet by mouth 2 (two) times daily.    Dispense:  14 tablet    Refill:  0  . DISCONTD: valACYclovir (VALTREX) 500 MG tablet    Sig: Take 1 tablet (500 mg total) by mouth 2 (two) times daily.    Dispense:  30 tablet    Refill:  prn  . valACYclovir (VALTREX) 500 MG tablet    Sig: Take 1 tablet (500 mg total) by mouth 2 (two) times daily. Take bid for 3 days prn each outbreak.    Dispense:  30 tablet    Refill:  prn

## 2017-06-12 LAB — CERVICOVAGINAL ANCILLARY ONLY
Bacterial vaginitis: NEGATIVE
Candida vaginitis: NEGATIVE
Chlamydia: NEGATIVE
Neisseria Gonorrhea: NEGATIVE
Trichomonas: NEGATIVE

## 2017-08-29 ENCOUNTER — Telehealth: Payer: Self-pay

## 2017-08-29 ENCOUNTER — Telehealth: Payer: Self-pay | Admitting: *Deleted

## 2017-08-29 DIAGNOSIS — N760 Acute vaginitis: Principal | ICD-10-CM

## 2017-08-29 DIAGNOSIS — B9689 Other specified bacterial agents as the cause of diseases classified elsewhere: Secondary | ICD-10-CM

## 2017-08-29 MED ORDER — METRONIDAZOLE 500 MG PO TABS: 500.0000 mg | ORAL_TABLET | Freq: Two times a day (BID) | ORAL | 0 refills | Status: DC

## 2017-08-29 NOTE — Telephone Encounter (Signed)
Pt called wanted to speak with Erskine SquibbJane. I informed patient that Erskine SquibbJane was seeing patients, and that I could assist her. Pt prefers to speak with Erskine SquibbJane. Jane informed.

## 2017-08-29 NOTE — Telephone Encounter (Signed)
Patient has changed her soap and caused a bacterial vaginosis. She is requesting treatment.

## 2017-08-30 ENCOUNTER — Other Ambulatory Visit: Payer: Self-pay | Admitting: Obstetrics

## 2017-08-30 DIAGNOSIS — N760 Acute vaginitis: Principal | ICD-10-CM

## 2017-08-30 DIAGNOSIS — B9689 Other specified bacterial agents as the cause of diseases classified elsewhere: Secondary | ICD-10-CM

## 2017-08-30 MED ORDER — METRONIDAZOLE 500 MG PO TABS: 500.0000 mg | ORAL_TABLET | Freq: Two times a day (BID) | ORAL | 2 refills | Status: DC

## 2017-08-30 NOTE — Telephone Encounter (Signed)
Flagyl Rx for BV 

## 2017-09-02 ENCOUNTER — Ambulatory Visit (INDEPENDENT_AMBULATORY_CARE_PROVIDER_SITE_OTHER): Payer: Medicaid Other | Admitting: Family Medicine

## 2017-09-02 ENCOUNTER — Encounter: Payer: Self-pay | Admitting: Family Medicine

## 2017-09-02 VITALS — BP 121/82 | HR 75 | Temp 99.1°F | Resp 16 | Ht 65.0 in | Wt 176.8 lb

## 2017-09-02 DIAGNOSIS — L720 Epidermal cyst: Secondary | ICD-10-CM

## 2017-09-02 NOTE — Progress Notes (Signed)
OFFICE VISIT  09/02/2017   CC:  Chief Complaint  Patient presents with  . hole in skin    HPI:    Patient is a 31 y.o. African-American female who presents for "hole in skin under arm". She noted a bit of white bump, popped it.  Noted this 2 d/a after using nare to remove underarm hair. No pain, no drainage.  There is a small defect/hole in skin where this papule was.  Past Medical History:  Diagnosis Date  . Abnormal Pap smear   . Anxiety and depression   . Asthma    childhood  . Atypical chest pain 09/01/2016   ED visit: +chest wall TTP.  D-dimer neg, TnI neg, CXR neg, EKG nl, no hypoxia.  . Family history of congenital hearing loss    [facility]  . History of uterine fibroid   . Metrorrhagia    with pelvic pain.  Transvag+transabd pelvic u/s NORMAL 05/2014.  Marland Kitchen. Recurrent genital herpes    HSV 2    Past Surgical History:  Procedure Laterality Date  . COLPOSCOPY W/ BIOPSY / CURETTAGE      Outpatient Medications Prior to Visit  Medication Sig Dispense Refill  . phentermine (ADIPEX-P) 37.5 MG tablet Take 37.5 mg by mouth daily.  0  . valACYclovir (VALTREX) 500 MG tablet Take 1 tablet (500 mg total) by mouth 2 (two) times daily. Take bid for 3 days prn each outbreak. 30 tablet prn  . amoxicillin-clavulanate (AUGMENTIN) 875-125 MG tablet Take 1 tablet by mouth 2 (two) times daily. (Patient not taking: Reported on 09/02/2017) 14 tablet 0  . cyclobenzaprine (FLEXERIL) 10 MG tablet Take 1 tablet (10 mg total) by mouth every 8 (eight) hours as needed for muscle spasms. (Patient not taking: Reported on 09/02/2017) 30 tablet 1  . metroNIDAZOLE (FLAGYL) 500 MG tablet Take 1 tablet (500 mg total) by mouth 2 (two) times daily. (Patient not taking: Reported on 09/02/2017) 14 tablet 2   No facility-administered medications prior to visit.     No Known Allergies  ROS As per HPI  PE: Blood pressure 121/82, pulse 75, temperature 99.1 F (37.3 C), temperature  source Oral, resp. rate 16, height 5\' 5"  (1.651 m), weight 176 lb 12 oz (80.2 kg), last menstrual period 08/12/2017, SpO2 100 %.  Pt examined with Wallace KellerHeather Kirby, CMA, as chaperone.  Gen: Alert, well appearing.  Patient is oriented to person, place, time, and situation. AFFECT: pleasant, lucid thought and speech. Right axilla with 2 mm opening in skin, without drainage or erythema or tenderness. Probing in this with tweezers shows that the hole is 1-2 mm deep, and there is no mucous or pus in it.   LABS:    Chemistry      Component Value Date/Time   NA 133 (L) 09/01/2016 1207   K 4.0 09/01/2016 1207   CL 102 09/01/2016 1207   CO2 25 09/01/2016 1207   BUN 9 09/01/2016 1207   CREATININE 0.93 09/01/2016 1207      Component Value Date/Time   CALCIUM 9.1 09/01/2016 1207   ALKPHOS 43 09/29/2015 1708   AST 23 09/29/2015 1708   ALT 48 (H) 09/29/2015 1708   BILITOT 0.3 09/29/2015 1708     Lab Results  Component Value Date   WBC 3.8 (L) 09/01/2016   HGB 13.6 09/01/2016   HCT 41.4 09/01/2016   MCV 89.8 09/01/2016   PLT 271 09/01/2016     IMPRESSION AND PLAN:  Small  epidermal inclusion cyst--patient "popped" this.  Now with small, temporary skin defect where the cystic material was. No sign of infection or residual cystic fluid/contents. Reassured pt that this will seal up on its own.    An After Visit Summary was printed and given to the patient.  FOLLOW UP: Return if symptoms worsen or fail to improve.  Signed:  Santiago Bumpers, MD           09/02/2017

## 2017-09-30 ENCOUNTER — Telehealth (INDEPENDENT_AMBULATORY_CARE_PROVIDER_SITE_OTHER): Payer: Self-pay | Admitting: Orthopaedic Surgery

## 2017-09-30 NOTE — Telephone Encounter (Signed)
Patient called asked for a call back in reference to a knee replacement. The number to contact patient is [phone]

## 2017-10-01 NOTE — Telephone Encounter (Signed)
This message is in regards to her mother, Valerie Robbins. She is status post total knee. She has spoken with Dr. Ophelia Charter. Patient needs to come back in for appt. Get x-rays at appt.   Ms. Hole wanted me to call her back in 30 min as she is in a meeting.

## 2017-10-02 NOTE — Telephone Encounter (Signed)
Note switched to patient chart

## 2017-10-10 ENCOUNTER — Encounter: Payer: Self-pay | Admitting: Family Medicine

## 2017-10-10 ENCOUNTER — Encounter: Payer: Medicaid Other | Admitting: Family Medicine

## 2017-10-10 NOTE — Progress Notes (Deleted)
Office Note 10/10/2017  CC: No chief complaint on file.   HPI:  Valerie Robbins is a 31 y.o. female who is here for annual health maintenance exam. GYN: Dr. Clearance Coots.  Past Medical History:  Diagnosis Date  . Abnormal Pap smear   . Anxiety and depression   . Asthma    childhood  . Atypical chest pain 09/01/2016   ED visit: +chest wall TTP.  D-dimer neg, TnI neg, CXR neg, EKG nl, no hypoxia.  . Family history of congenital hearing loss    Aud[facility] eval at [facility] normal.  . History of uterine fibroid   . Metrorrhagia    with pelvic pain.  Transvag+transabd pelvic u/s NORMAL 05/2014.  Marland Kitchen Recurrent genital herpes    HSV 2    Past Surgical History:  Procedure Laterality Date  . COLPOSCOPY W/ BIOPSY / CURETTAGE      Family History  Problem Relation Age of Onset  . Hypertension Mother   . Depression Mother   . Deafness Mother   . Asthma Father   . Alcohol abuse Father   . Hypertension Maternal Grandmother   . Depression Maternal Grandmother   . Diabetes Maternal Grandmother   . Anxiety disorder Maternal Grandmother   . Deafness Maternal Grandfather   . Hearing loss Sister 0       both parents, sister, grandparents were all born deaf    Social History   Social History  . Marital status: Single    Spouse name: N/A  . Number of children: 3  . Years of education: 14   Occupational History  . Not on file.   Social History Main Topics  . Smoking status: Never Smoker  . Smokeless tobacco: Never Used  . Alcohol use 0.0 oz/week     Comment: social   . Drug use: No  . Sexual activity: Not Currently    Partners: Male    Birth control/ protection: None   Other Topics Concern  . Not on file   Social History Narrative   Single, 3 sons.   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   No exercise.   Fun: [name] out with her children.   Denies abuse and feels safe at home.     Outpatient Medications Prior to Visit   Medication Sig Dispense Refill  . metroNIDAZOLE (FLAGYL) 500 MG tablet Take 1 tablet by mouth 2 (two) times daily.  0  . phentermine (ADIPEX-P) 37.5 MG tablet Take 37.5 mg by mouth daily.  0  . valACYclovir (VALTREX) 500 MG tablet Take 1 tablet (500 mg total) by mouth 2 (two) times daily. Take bid for 3 days prn each outbreak. 30 tablet prn   No facility-administered medications prior to visit.     No Known Allergies  ROS *** PE; There were no vitals taken for this visit. *** Pertinent labs:  Lab Results  Component Value Date   TSH 2.18 09/22/2015   Lab Results  Component Value Date   WBC 3.8 (L) 09/01/2016   HGB 13.6 09/01/2016   HCT 41.4 09/01/2016   MCV 89.8 09/01/2016   PLT 271 09/01/2016   Lab Results  Component Value Date   CREATININE 0.93 09/01/2016   BUN 9 09/01/2016   NA 133 (L) 09/01/2016   K 4.0 09/01/2016   CL 102 09/01/2016   CO2 25 09/01/2016   Lab Results  Component Value Date   ALT 48 (H) 09/29/2015  AST 23 09/29/2015   ALKPHOS 43 09/29/2015   BILITOT 0.3 09/29/2015   Lab Results  Component Value Date   CHOL 131 09/22/2015   Lab Results  Component Value Date   HDL 49.80 09/22/2015   Lab Results  Component Value Date   LDLCALC 73 09/22/2015   Lab Results  Component Value Date   TRIG 39.0 09/22/2015   Lab Results  Component Value Date   CHOLHDL 3 09/22/2015    ASSESSMENT AND PLAN:   Health maintenance exam: Reviewed age and gender appropriate health maintenance issues (prudent diet, regular exercise, health risks of tobacco and excessive alcohol, use of seatbelts, fire alarms in home, use of sunscreen).  Also reviewed age and gender appropriate health screening as well as vaccine recommendations. Vaccines: Tdap due--  Flu vaccine-- Labs: fasting HP Cervical ca screening/hx of abnormal paps+ colposcopy   FOLLOW UP:  No Follow-up on file.

## 2017-11-01 ENCOUNTER — Encounter: Payer: Self-pay | Admitting: Family Medicine

## 2017-11-01 ENCOUNTER — Ambulatory Visit (INDEPENDENT_AMBULATORY_CARE_PROVIDER_SITE_OTHER): Payer: Medicaid Other | Admitting: Family Medicine

## 2017-11-01 ENCOUNTER — Other Ambulatory Visit: Payer: Self-pay

## 2017-11-01 ENCOUNTER — Other Ambulatory Visit (HOSPITAL_COMMUNITY)
Admission: RE | Admit: 2017-11-01 | Discharge: 2017-11-01 | Disposition: A | Discharge status: Home / Self Care | Payer: Medicaid Other | Source: Ambulatory Visit | Attending: Family Medicine | Admitting: Family Medicine

## 2017-11-01 VITALS — BP 107/70 | HR 63 | Temp 98.6°F | Resp 16 | Ht 65.0 in | Wt 179.2 lb

## 2017-11-01 DIAGNOSIS — Z124 Encounter for screening for malignant neoplasm of cervix: Secondary | ICD-10-CM | POA: Diagnosis not present

## 2017-11-01 DIAGNOSIS — Z87898 Personal history of other specified conditions: Secondary | ICD-10-CM

## 2017-11-01 DIAGNOSIS — Z0000 Encounter for general adult medical examination without abnormal findings: Secondary | ICD-10-CM | POA: Diagnosis not present

## 2017-11-01 DIAGNOSIS — Z8742 Personal history of other diseases of the female genital tract: Secondary | ICD-10-CM

## 2017-11-01 DIAGNOSIS — R300 Dysuria: Secondary | ICD-10-CM

## 2017-11-01 DIAGNOSIS — R3129 Other microscopic hematuria: Secondary | ICD-10-CM | POA: Diagnosis not present

## 2017-11-01 LAB — COMPREHENSIVE METABOLIC PANEL
ALT: 13 U/L (ref 0–35)
AST: 16 U/L (ref 0–37)
Albumin: 3.8 g/dL (ref 3.5–5.2)
Alkaline Phosphatase: 26 U/L — ABNORMAL LOW (ref 39–117)
BUN: 10 mg/dL (ref 6–23)
CHLORIDE: 105 meq/L (ref 96–112)
CO2: 29 meq/L (ref 19–32)
Calcium: 9 mg/dL (ref 8.4–10.5)
Creatinine, Ser: 0.89 mg/dL (ref 0.40–1.20)
GFR: 93.91 mL/min (ref >60.00)
GLUCOSE: 87 mg/dL (ref 70–99)
POTASSIUM: 4.2 meq/L (ref 3.5–5.1)
SODIUM: 139 meq/L (ref 135–145)
Total Bilirubin: 0.6 mg/dL (ref 0.2–1.2)
Total Protein: 6.4 g/dL (ref 6.0–8.3)

## 2017-11-01 LAB — POCT URINALYSIS DIPSTICK
GLUCOSE UA: NEGATIVE
KETONES UA: NEGATIVE
LEUKOCYTES UA: NEGATIVE
Nitrite, UA: NEGATIVE
PH UA: 5.5 (ref 5.0–8.0)
PROTEIN UA: NEGATIVE
Spec Grav, UA: 1.025 (ref 1.010–1.025)
Urobilinogen, UA: 0.2 E.U./dL

## 2017-11-01 LAB — LIPID PANEL
Cholesterol: 141 mg/dL (ref 0–200)
HDL: 38.5 mg/dL — ABNORMAL LOW (ref >39.00)
LDL CALC: 91 mg/dL (ref 0–99)
NonHDL: 102.37
Total CHOL/HDL Ratio: 4
Triglycerides: 55 mg/dL (ref 0.0–149.0)
VLDL: 11 mg/dL (ref 0.0–40.0)

## 2017-11-01 LAB — CBC WITH DIFFERENTIAL/PLATELET
BASOS PCT: 0.6 % (ref 0.0–3.0)
Basophils Absolute: 0 10*3/uL (ref 0.0–0.1)
Eosinophils Absolute: 0.1 10*3/uL (ref 0.0–0.7)
Eosinophils Relative: 3 % (ref 0.0–5.0)
HEMATOCRIT: 40.3 % (ref 36.0–46.0)
HEMOGLOBIN: 12.9 g/dL (ref 12.0–15.0)
Lymphs Abs: 2.3 10*3/uL (ref 0.7–4.0)
MCHC: 32 g/dL (ref 30.0–36.0)
MCV: 91.7 fl (ref 78.0–100.0)
MONO ABS: 0.4 10*3/uL (ref 0.1–1.0)
Monocytes Relative: 10.2 % (ref 3.0–12.0)
NEUTROS ABS: 1.3 10*3/uL — AB (ref 1.4–7.7)
Neutrophils Relative %: 31 % — ABNORMAL LOW (ref 43.0–77.0)
Platelets: 368 10*3/uL (ref 150.0–400.0)
RBC: 4.39 Mil/uL (ref 3.87–5.11)
RDW: 14.2 % (ref 11.5–15.5)
WBC: 4.1 10*3/uL (ref 4.0–10.5)

## 2017-11-01 LAB — TSH: TSH: 2.2 u[IU]/mL (ref 0.35–4.50)

## 2017-11-01 NOTE — Patient Instructions (Signed)

## 2017-11-01 NOTE — Progress Notes (Signed)
Office Note 11/01/2017  CC:  Chief Complaint  Patient presents with  . Annual Exam    Pt is fasting.     HPI:  Valerie Robbins is a 31 y.o. female who is here for annual health maintenance exam.  Says she just had menses but finished now. Had sex once on 10/15/17 which was first time in 2 yrs, +condom use. +Hx of abnl pap/colp neg---needs annual pap.  Past Medical History:  Diagnosis Date  . Abnormal Pap smear   . Anxiety and depression   . Asthma    childhood  . Atypical chest pain 09/01/2016   ED visit: +chest wall TTP.  D-dimer neg, TnI neg, CXR neg, EKG nl, no hypoxia.  . Family history of congenital hearing loss    Audiology eval at [facility] ENT normal.  . History of uterine fibroid   . Metrorrhagia    with pelvic pain.  Transvag+transabd pelvic u/s NORMAL 05/2014.  Marland Kitchen Recurrent genital herpes    HSV 2    Past Surgical History:  Procedure Laterality Date  . COLPOSCOPY W/ BIOPSY / CURETTAGE      Family History  Problem Relation Age of Onset  . Hypertension Mother   . Depression Mother   . Deafness Mother   . Asthma Father   . Alcohol abuse Father   . Hypertension Maternal Grandmother   . Depression Maternal Grandmother   . Diabetes Maternal Grandmother   . Anxiety disorder Maternal Grandmother   . Deafness Maternal Grandfather   . Hearing loss Sister 0       both parents, sister, grandparents were all born deaf    Social History   Socioeconomic History  . Marital status: Single    Spouse name: Not on file  . Number of children: 3  . Years of education: 73  . Highest education level: Not on file  Social Needs  . Financial resource strain: Not on file  . Food insecurity - worry: Not on file  . Food insecurity - inability: Not on file  . Transportation needs - medical: Not on file  . Transportation needs - non-medical: Not on file  Occupational History  . Not on file  Tobacco Use  . Smoking status: Never Smoker  . Smokeless tobacco: Never  Used  Substance and Sexual Activity  . Alcohol use: Yes    Alcohol/week: 0.0 oz    Comment: social   . Drug use: No  . Sexual activity: Not Currently    Partners: Male    Birth control/protection: None  Other Topics Concern  . Not on file  Social History Narrative   Single, 3 sons.   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   No exercise.   Fun: [name] out with her children.   Denies abuse and feels safe at home.     Outpatient Medications Prior to Visit  Medication Sig Dispense Refill  . phentermine (ADIPEX-P) 37.5 MG tablet Take 37.5 mg by mouth daily.  0  . metroNIDAZOLE (FLAGYL) 500 MG tablet Take 1 tablet by mouth 2 (two) times daily.  0  . valACYclovir (VALTREX) 500 MG tablet Take 1 tablet (500 mg total) by mouth 2 (two) times daily. Take bid for 3 days prn each outbreak. (Patient not taking: Reported on 11/01/2017) 30 tablet prn   No facility-administered medications prior to visit.     No Known Allergies  ROS Review of Systems  Constitutional: Negative for  appetite change, chills, fatigue and fever.  HENT: Negative for congestion, dental problem, ear pain and sore throat.   Eyes: Negative for discharge, redness and visual disturbance.  Respiratory: Negative for cough, chest tightness, shortness of breath and wheezing.   Cardiovascular: Negative for chest pain, palpitations and leg swelling.  Gastrointestinal: Negative for abdominal pain, blood in stool, diarrhea, nausea and vomiting.  Genitourinary: Positive for dysuria (? mild per pt; she's not sure). Negative for difficulty urinating, flank pain, frequency, hematuria and urgency.  Musculoskeletal: Negative for arthralgias, back pain, joint swelling, myalgias and neck stiffness.  Skin: Negative for pallor and rash.  Neurological: Negative for dizziness, speech difficulty, weakness and headaches.  Hematological: Negative for adenopathy. Does not bruise/bleed easily.   Psychiatric/Behavioral: Negative for confusion and sleep disturbance. The patient is not nervous/anxious.     PE; Blood pressure 107/70, pulse 63, temperature 98.6 F (37 C), temperature source Oral, resp. rate 16, height 5\' 5"  (1.651 m), weight 179 lb 4 oz (81.3 kg), last menstrual period 10/29/2017, SpO2 100 %. Pt examined with Vanetta MuldersLaura Ray, CMA, as chaperone. Gen: Alert, well appearing.  Patient is oriented to person, place, time, and situation. AFFECT: pleasant, lucid thought and speech. ENT: Ears: EACs clear, normal epithelium.  TMs with good light reflex and landmarks bilaterally.  Eyes: no injection, icteris, swelling, or exudate.  EOMI, PERRLA. Nose: no drainage or turbinate edema/swelling.  No injection or focal lesion.  Mouth: lips without lesion/swelling.  Oral mucosa pink and moist.  Dentition intact and without obvious caries or gingival swelling.  Oropharynx without erythema, exudate, or swelling.  Neck: supple/nontender.  No LAD, mass, or TM.   CV: RRR, no m/r/g.   LUNGS: CTA bilat, nonlabored resps, good aeration in all lung fields. ABD: soft, NT, ND, BS normal.  No hepatospenomegaly or mass.  No bruits. EXT: no clubbing, cyanosis, or edema.  Musculoskeletal: no joint swelling, erythema, warmth, or tenderness.  ROM of all joints intact. Skin - no sores or suspicious lesions or rashes or color changes Vagina and vulva are normal;  no discharge is noted but there is small amount of dark red blood in vaginal vault.  Os closed.  Cervix normal without lesions. . Pap Smear - is due and ordered today. Exam chaperoned by female assistant.    Pertinent labs:  Lab Results  Component Value Date   TSH 2.18 09/22/2015   Lab Results  Component Value Date   WBC 3.8 (L) 09/01/2016   HGB 13.6 09/01/2016   HCT 41.4 09/01/2016   MCV 89.8 09/01/2016   PLT 271 09/01/2016   Lab Results  Component Value Date   CREATININE 0.93 09/01/2016   BUN 9 09/01/2016   NA 133 (L) 09/01/2016   K  4.0 09/01/2016   CL 102 09/01/2016   CO2 25 09/01/2016   Lab Results  Component Value Date   ALT 48 (H) 09/29/2015   AST 23 09/29/2015   ALKPHOS 43 09/29/2015   BILITOT 0.3 09/29/2015   Lab Results  Component Value Date   CHOL 131 09/22/2015   Lab Results  Component Value Date   HDL 49.80 09/22/2015   Lab Results  Component Value Date   LDLCALC 73 09/22/2015   Lab Results  Component Value Date   TRIG 39.0 09/22/2015   Lab Results  Component Value Date   CHOLHDL 3 09/22/2015   UA dipstick today: trace intact blood, o/w normal.  ASSESSMENT AND PLAN:   1) Dysuria; question of.  She may  just be feeling a bit of irritation related to recent menses. Trace intact blood also likely from recent menses (blood in vaginal vault today). Sent urine for UA with microscopy + c/s.  2) Health maintenance exam: Reviewed age and gender appropriate health maintenance issues (prudent diet, regular exercise, health risks of tobacco and excessive alcohol, use of seatbelts, fire alarms in home, use of sunscreen).  Also reviewed age and gender appropriate health screening as well as vaccine recommendations. Labs: fasting HP + pap smear today. Vaccines: she declined Tdap and flu today. Cerv ca screening/hx of abnl pap + colpo (neg): pap done today and we'll continue these annually until she has had at least 5 more consecutive normal paps.  An After Visit Summary was printed and given to the patient.  FOLLOW UP:  Return in about 1 year (around 11/01/2018) for annual CPE (fasting).  Signed:  Santiago BumpersPhil McGowen, MD           11/01/2017

## 2017-11-02 LAB — URINALYSIS, ROUTINE W REFLEX MICROSCOPIC
Bacteria, UA: NONE SEEN /HPF
Bilirubin Urine: NEGATIVE
Glucose, UA: NEGATIVE
Hyaline Cast: NONE SEEN /LPF
KETONES UR: NEGATIVE
LEUKOCYTES UA: NEGATIVE
NITRITE: NEGATIVE
PROTEIN: NEGATIVE
RBC / HPF: NONE SEEN /HPF (ref 0–2)
SPECIFIC GRAVITY, URINE: 1.02 (ref 1.001–1.03)
WBC UA: NONE SEEN /HPF (ref 0–5)
pH: 5.5 (ref 5.0–8.0)

## 2017-11-03 LAB — URINE CULTURE
MICRO NUMBER: 81267439
SPECIMEN QUALITY: ADEQUATE

## 2017-11-04 ENCOUNTER — Encounter: Payer: Self-pay | Admitting: Family Medicine

## 2017-11-04 ENCOUNTER — Other Ambulatory Visit: Payer: Self-pay | Admitting: Family Medicine

## 2017-11-04 DIAGNOSIS — R5382 Chronic fatigue, unspecified: Secondary | ICD-10-CM

## 2017-11-04 DIAGNOSIS — Z113 Encounter for screening for infections with a predominantly sexual mode of transmission: Secondary | ICD-10-CM

## 2017-11-04 LAB — CYTOLOGY - PAP: DIAGNOSIS: NEGATIVE

## 2017-11-05 ENCOUNTER — Other Ambulatory Visit: Payer: Medicaid Other

## 2017-11-12 ENCOUNTER — Encounter: Payer: Medicaid Other | Admitting: Family Medicine

## 2017-12-09 ENCOUNTER — Encounter: Payer: Self-pay | Admitting: Family Medicine

## 2017-12-09 ENCOUNTER — Telehealth: Payer: Self-pay | Admitting: Family Medicine

## 2017-12-09 ENCOUNTER — Ambulatory Visit (INDEPENDENT_AMBULATORY_CARE_PROVIDER_SITE_OTHER): Payer: Medicaid Other | Admitting: Family Medicine

## 2017-12-09 VITALS — BP 122/66 | HR 78 | Temp 99.0°F | Resp 16 | Ht 65.0 in | Wt 180.0 lb

## 2017-12-09 DIAGNOSIS — T2020XS Burn of second degree of head, face, and neck, unspecified site, sequela: Secondary | ICD-10-CM

## 2017-12-09 DIAGNOSIS — T2220XD Burn of second degree of shoulder and upper limb, except wrist and hand, unspecified site, subsequent encounter: Secondary | ICD-10-CM

## 2017-12-09 MED ORDER — OXYCODONE HCL 5 MG PO TABS: ORAL_TABLET | ORAL | 0 refills | Status: DC
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
## 2017-12-09 NOTE — Telephone Encounter (Signed)
I saw pt in office today. 
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
## 2017-12-10 DIAGNOSIS — T22212A Burn of second degree of left forearm, initial encounter: Secondary | ICD-10-CM | POA: Diagnosis not present

## 2017-12-10 DIAGNOSIS — T2026XA Burn of second degree of forehead and cheek, initial encounter: Secondary | ICD-10-CM | POA: Diagnosis not present

## 2017-12-13 ENCOUNTER — Ambulatory Visit: Payer: Medicaid Other | Admitting: Physician Assistant

## 2018-07-03 ENCOUNTER — Ambulatory Visit: Payer: Medicaid Other | Admitting: Family Medicine

## 2018-07-04 ENCOUNTER — Ambulatory Visit: Payer: Medicaid Other | Admitting: Family Medicine

## 2018-07-15 ENCOUNTER — Ambulatory Visit (INDEPENDENT_AMBULATORY_CARE_PROVIDER_SITE_OTHER): Payer: Managed Care, Other (non HMO) | Admitting: Family Medicine

## 2018-07-15 ENCOUNTER — Encounter: Payer: Self-pay | Admitting: Family Medicine

## 2018-07-15 VITALS — BP 126/71 | HR 62 | Temp 98.7°F | Resp 16 | Ht 65.0 in | Wt 214.4 lb

## 2018-07-15 DIAGNOSIS — R609 Edema, unspecified: Secondary | ICD-10-CM

## 2018-07-15 DIAGNOSIS — R635 Abnormal weight gain: Secondary | ICD-10-CM

## 2018-07-15 LAB — COMPREHENSIVE METABOLIC PANEL
ALBUMIN: 4 g/dL (ref 3.5–5.2)
ALT: 19 U/L (ref 0–35)
AST: 17 U/L (ref 0–37)
Alkaline Phosphatase: 26 U/L — ABNORMAL LOW (ref 39–117)
BILIRUBIN TOTAL: 0.6 mg/dL (ref 0.2–1.2)
BUN: 14 mg/dL (ref 6–23)
CHLORIDE: 107 meq/L (ref 96–112)
CO2: 28 meq/L (ref 19–32)
CREATININE: 0.95 mg/dL (ref 0.40–1.20)
Calcium: 8.8 mg/dL (ref 8.4–10.5)
GFR: 86.73 mL/min (ref >60.00)
Glucose, Bld: 94 mg/dL (ref 70–99)
Potassium: 4.3 mEq/L (ref 3.5–5.1)
SODIUM: 139 meq/L (ref 135–145)
Total Protein: 6.7 g/dL (ref 6.0–8.3)

## 2018-07-15 LAB — POCT URINALYSIS DIPSTICK
BILIRUBIN UA: NEGATIVE
GLUCOSE UA: NEGATIVE
Ketones, UA: NEGATIVE
LEUKOCYTES UA: NEGATIVE
Nitrite, UA: NEGATIVE
Protein, UA: NEGATIVE
RBC UA: NEGATIVE
Spec Grav, UA: >=1.03 — AB (ref 1.010–1.025)
Urobilinogen, UA: 0.2 E.U./dL
pH, UA: 6 (ref 5.0–8.0)

## 2018-07-15 LAB — CORTISOL: Cortisol, Plasma: 5.3 ug/dL

## 2018-07-15 LAB — TSH: TSH: 3.14 u[IU]/mL (ref 0.35–4.50)

## 2018-07-15 MED ORDER — FUROSEMIDE 20 MG PO TABS: ORAL_TABLET | ORAL | 0 refills | Status: DC

## 2018-07-15 NOTE — Progress Notes (Signed)
OFFICE VISIT  07/15/2018   CC:  Chief Complaint  Patient presents with  . LE swelling   HPI:    Patient is a 32 y.o. African-American female who presents for swelling in both LLs/feet. First noted this about 5 mo ago.  Lasted 1-2 days. Occurred 4-5 more times since then.  No extreme changes in sodium intake prior to episodes. She shows me several pictures today that show signif ankle/feet edema bilat. She would feel more bloated during these times but no hand swelling. Admits to eating a bit more.  No exercise.  Has gained 35 lbs since I last saw her 8 mo ago.  ROS: 2 mo hx of harder to hold her urine.  No CP, no SOB, no wheezing, no cough, no dizziness, no HAs, no rashes, no melena/hematochezia.  No polyuria or polydipsia.  No myalgias or arthralgias.  Past Medical History:  Diagnosis Date  . Abnormal Pap smear   . Anxiety and depression   . Asthma    childhood  . Atypical chest pain 09/01/2016   ED visit: +chest wall TTP.  D-dimer neg, TnI neg, CXR neg, EKG nl, no hypoxia.  . Family history of congenital hearing loss    Audiology eval at [facility]NT normal.  . History of uterine fibroid   . Metrorrhagia    with pelvic pain.  Transvag+transabd pelvic u/s NORMAL 05/2014.  Marland Kitchen Recurrent genital herpes    HSV 2    Past Surgical History:  Procedure Laterality Date  . COLPOSCOPY W/ BIOPSY / CURETTAGE     needs annual paps    Outpatient Medications Prior to Visit  Medication Sig Dispense Refill  . oxyCODONE (OXY IR/ROXICODONE) 5 MG immediate release tablet 1-2 tabs po q6h prn severe pain (Patient not taking: Reported on 07/15/2018) 30 tablet 0  . phentermine (ADIPEX-P) 37.5 MG tablet Take 37.5 mg by mouth daily.  0  . silver sulfADIAZINE (SILVADENE) 1 % cream Apply topically.     No facility-administered medications prior to visit.     No Known Allergies  ROS As per HPI  PE: Blood pressure 126/71, pulse 62, temperature 98.7 F (37.1 C), temperature source Oral, resp.  rate 16, height 5\' 5"  (1.651 m), weight 214 lb 6 oz (97.2 kg), last menstrual period 07/12/2018, SpO2 100 %. Body mass index is 35.67 kg/m. Pt examined with Judie Grieve, nurse, as chaperone.  Gen: Alert, well appearing.  Patient is oriented to person, place, time, and situation. AFFECT: pleasant, lucid thought and speech. UJW:JXBJ: no injection, icteris, swelling, or exudate.  EOMI, PERRLA. Mouth: lips without lesion/swelling.  Oral mucosa pink and moist. Oropharynx without erythema, exudate, or swelling.  CV: RRR, no m/r/g.   LUNGS: CTA bilat, nonlabored resps, good aeration in all lung fields. ABD: soft, NT, ND, BS normal.  No hepatospenomegaly or mass.  No bruits. EXT: no clubbing or cyanosis.  She has 1 + pitting edema in pretibial region and ankle bilat, trace in tops of both feet. No rash.  No swelling of hands.   LABS:    Chemistry      Component Value Date/Time   NA 139 11/01/2017 1018   K 4.2 11/01/2017 1018   CL 105 11/01/2017 1018   CO2 29 11/01/2017 1018   BUN 10 11/01/2017 1018   CREATININE 0.89 11/01/2017 1018      Component Value Date/Time   CALCIUM 9.0 11/01/2017 1018   ALKPHOS 26 (L) 11/01/2017 1018   AST 16 11/01/2017 1018  ALT 13 11/01/2017 1018   BILITOT 0.6 11/01/2017 1018     Lab Results  Component Value Date   WBC 4.1 11/01/2017   HGB 12.9 11/01/2017   HCT 40.3 11/01/2017   MCV 91.7 11/01/2017   PLT 368.0 11/01/2017   Lab Results  Component Value Date   TSH 2.20 11/01/2017   Lab Results  Component Value Date   CHOL 141 11/01/2017   HDL 38.50 (L) 11/01/2017   LDLCALC 91 11/01/2017   TRIG 55.0 11/01/2017   CHOLHDL 4 11/01/2017   POC UA today: normal except SG > 1.030  IMPRESSION AND PLAN:  1) Peripheral (LE/feet bilat) edema; with abnormal weight gain. Suspect this is all dietary/excessive sodium intake, increased caloric intake, and lack of calorie-burning exercise. Low sodium eating plan reviewed, handout given today. Labs:  check TSH, CMET, UA (for protein), cortisol level. Start furosemide 20mg  qd AS NEEDED FOR INCREASED LEGS/FEET swelling. Importance of using this med only prn was emphasized today and pt expressed understanding.  Spent 25 min with pt today, with >50% of this time spent in counseling and care coordination regarding the above problems.  An After Visit Summary was printed and given to the patient.  FOLLOW UP: Return if symptoms worsen or fail to improve.  Signed:  Santiago BumpersPhil McGowen, MD           07/15/2018

## 2018-07-15 NOTE — Telephone Encounter (Signed)
Please advise. Thanks.  

## 2018-07-15 NOTE — Patient Instructions (Signed)

## 2018-07-15 NOTE — Telephone Encounter (Signed)
The only thing this result indicates is that her urine is concentrated---she needs to drink more clear liquids (water) in order to make her urine less concentrated. It is a harmless result and has nothing to do with her swelling or any other symptoms she has.

## 2018-10-07 ENCOUNTER — Ambulatory Visit: Payer: Self-pay

## 2018-10-07 NOTE — Telephone Encounter (Signed)
Noted  
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
## 2018-10-08 ENCOUNTER — Ambulatory Visit (INDEPENDENT_AMBULATORY_CARE_PROVIDER_SITE_OTHER): Payer: Managed Care, Other (non HMO) | Admitting: Family Medicine

## 2018-10-08 ENCOUNTER — Encounter: Payer: Self-pay | Admitting: Family Medicine

## 2018-10-08 ENCOUNTER — Other Ambulatory Visit: Payer: Self-pay

## 2018-10-08 VITALS — BP 129/83 | HR 70 | Temp 98.6°F | Resp 16 | Ht 65.0 in | Wt 220.0 lb

## 2018-10-08 DIAGNOSIS — F4323 Adjustment disorder with mixed anxiety and depressed mood: Secondary | ICD-10-CM

## 2018-10-08 DIAGNOSIS — F411 Generalized anxiety disorder: Secondary | ICD-10-CM | POA: Diagnosis not present

## 2018-10-08 DIAGNOSIS — F430 Acute stress reaction: Secondary | ICD-10-CM

## 2018-10-08 MED ORDER — LORAZEPAM 0.5 MG PO TABS: ORAL_TABLET | ORAL | 0 refills | Status: DC

## 2018-10-08 MED ORDER — CITALOPRAM HYDROBROMIDE 20 MG PO TABS: 20.0000 mg | ORAL_TABLET | Freq: Every day | ORAL | 0 refills | Status: DC
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
## 2018-10-09 ENCOUNTER — Telehealth: Payer: Self-pay

## 2018-10-09 NOTE — Telephone Encounter (Signed)
Return call to pt regarding message Pt requested Rx muscle relaxer for tension in back , back pain and neck pain to [facility] on [address]. Pt scheduling Annual Exam today.

## 2018-10-10 ENCOUNTER — Other Ambulatory Visit: Payer: Self-pay | Admitting: Obstetrics

## 2018-10-10 DIAGNOSIS — Z0279 Encounter for issue of other medical certificate: Secondary | ICD-10-CM

## 2018-10-10 DIAGNOSIS — M546 Pain in thoracic spine: Secondary | ICD-10-CM

## 2018-10-10 MED ORDER — CYCLOBENZAPRINE HCL 10 MG PO TABS: 10.0000 mg | ORAL_TABLET | Freq: Three times a day (TID) | ORAL | 1 refills | Status: DC | PRN

## 2018-10-10 MED ORDER — METHOCARBAMOL 500 MG PO TABS: 500.0000 mg | ORAL_TABLET | Freq: Three times a day (TID) | ORAL | 1 refills | Status: DC | PRN

## 2018-10-10 NOTE — Telephone Encounter (Signed)
Flexeril / Robaxin Rx

## 2018-10-11 ENCOUNTER — Encounter: Payer: Self-pay | Admitting: Family Medicine

## 2018-10-13 NOTE — Telephone Encounter (Signed)
Pls let pt know that I will fill out paperwork as quick as I am able. Also, pls see if Diane or Tim Lair can assist in getting pt with a counselor in our system earlier than January.-thx

## 2018-10-13 NOTE — Telephone Encounter (Signed)
Please advise. Thanks.  

## 2018-10-15 NOTE — Telephone Encounter (Signed)
The citalopram (antidepressant and anti-anxiety medication) takes 3 weeks to begin to help.  The lorazepam should work within an hour or so after she takes it.

## 2018-10-17 NOTE — Telephone Encounter (Signed)
Please advise. Thanks.  

## 2018-10-17 NOTE — Telephone Encounter (Signed)
I have completed the paperwork she gave me.

## 2018-10-30 ENCOUNTER — Ambulatory Visit: Payer: 59 | Admitting: Clinical

## 2018-10-30 ENCOUNTER — Telehealth: Payer: Self-pay | Admitting: *Deleted

## 2018-10-30 ENCOUNTER — Ambulatory Visit (INDEPENDENT_AMBULATORY_CARE_PROVIDER_SITE_OTHER): Payer: 59 | Admitting: Clinical

## 2018-10-30 DIAGNOSIS — F4312 Post-traumatic stress disorder, chronic: Secondary | ICD-10-CM

## 2018-10-30 NOTE — Telephone Encounter (Signed)
OK.  Change to form has been done, placed in box to go up front.

## 2018-10-30 NOTE — Telephone Encounter (Signed)
Pt dropped off letter requesting date change on her FMLA paperwork.  [employer] paperwork is attached to the letter and was placed on Dr. Samul Dada desk for review.

## 2018-11-02 ENCOUNTER — Other Ambulatory Visit: Payer: Self-pay | Admitting: Family Medicine

## 2018-11-03 ENCOUNTER — Ambulatory Visit (INDEPENDENT_AMBULATORY_CARE_PROVIDER_SITE_OTHER): Payer: 59 | Admitting: Clinical

## 2018-11-03 DIAGNOSIS — F4312 Post-traumatic stress disorder, chronic: Secondary | ICD-10-CM

## 2018-11-03 NOTE — Telephone Encounter (Signed)
RF request for lorazepam LOV: 10/08/18 Next ov: 11/05/18 Last written: 10/08/18 #60 w/ 0RF  Please advise. Thanks.

## 2018-11-03 NOTE — Telephone Encounter (Signed)
Called patient to advise updated form has been faxed for her. Patient said thank you and that Dr. Milinda Cave is "the best".

## 2018-11-05 ENCOUNTER — Ambulatory Visit (INDEPENDENT_AMBULATORY_CARE_PROVIDER_SITE_OTHER): Payer: Managed Care, Other (non HMO) | Admitting: Family Medicine

## 2018-11-05 ENCOUNTER — Encounter: Payer: Self-pay | Admitting: Family Medicine

## 2018-11-05 ENCOUNTER — Encounter: Payer: Self-pay | Admitting: *Deleted

## 2018-11-05 VITALS — BP 112/74 | HR 72 | Temp 99.4°F | Resp 16 | Ht 65.0 in | Wt 224.1 lb

## 2018-11-05 DIAGNOSIS — R509 Fever, unspecified: Secondary | ICD-10-CM | POA: Diagnosis not present

## 2018-11-05 DIAGNOSIS — F4310 Post-traumatic stress disorder, unspecified: Secondary | ICD-10-CM | POA: Diagnosis not present

## 2018-11-05 DIAGNOSIS — J029 Acute pharyngitis, unspecified: Secondary | ICD-10-CM

## 2018-11-05 DIAGNOSIS — F430 Acute stress reaction: Secondary | ICD-10-CM

## 2018-11-05 DIAGNOSIS — B349 Viral infection, unspecified: Secondary | ICD-10-CM

## 2018-11-05 LAB — POC INFLUENZA A&B (BINAX/QUICKVUE)
Influenza A, POC: NEGATIVE
Influenza B, POC: NEGATIVE

## 2018-11-05 LAB — POCT RAPID STREP A (OFFICE): RAPID STREP A SCREEN: NEGATIVE

## 2018-11-05 MED ORDER — CITALOPRAM HYDROBROMIDE 40 MG PO TABS: 40.0000 mg | ORAL_TABLET | Freq: Every day | ORAL | 1 refills | Status: DC

## 2018-11-05 MED ORDER — LORAZEPAM 0.5 MG PO TABS: ORAL_TABLET | ORAL | 0 refills | Status: DC
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
## 2018-11-05 NOTE — Patient Instructions (Signed)
Tylenol 1000 mg every 6 hours as needed for body aches and/or fever. Get otc generic robitussin DM OR Mucinex DM and use as directed on the packaging for cough and congestion. Use otc generic saline nasal spray 2-3 times per day to irrigate/moisturize your nasal passages. Drink lots of water and gatorade/poweraid.   REst.

## 2018-11-10 ENCOUNTER — Ambulatory Visit: Payer: 59 | Admitting: Clinical

## 2018-11-10 ENCOUNTER — Ambulatory Visit: Payer: Managed Care, Other (non HMO) | Admitting: Family Medicine

## 2018-11-19 ENCOUNTER — Ambulatory Visit (INDEPENDENT_AMBULATORY_CARE_PROVIDER_SITE_OTHER): Payer: 59 | Admitting: Clinical

## 2018-11-19 DIAGNOSIS — F4312 Post-traumatic stress disorder, chronic: Secondary | ICD-10-CM

## 2018-11-26 DIAGNOSIS — H5213 Myopia, bilateral: Secondary | ICD-10-CM | POA: Diagnosis not present

## 2018-12-03 ENCOUNTER — Ambulatory Visit: Payer: 59 | Admitting: Clinical

## 2018-12-08 ENCOUNTER — Ambulatory Visit: Payer: 59 | Admitting: Clinical

## 2018-12-22 ENCOUNTER — Ambulatory Visit: Payer: 59 | Admitting: Clinical

## 2018-12-30 ENCOUNTER — Ambulatory Visit (INDEPENDENT_AMBULATORY_CARE_PROVIDER_SITE_OTHER): Payer: 59 | Admitting: Clinical

## 2018-12-30 DIAGNOSIS — F4312 Post-traumatic stress disorder, chronic: Secondary | ICD-10-CM | POA: Diagnosis not present

## 2018-12-31 ENCOUNTER — Ambulatory Visit: Payer: 59 | Admitting: Clinical

## 2019-01-05 ENCOUNTER — Ambulatory Visit: Payer: 59 | Admitting: Clinical

## 2019-01-06 ENCOUNTER — Ambulatory Visit (INDEPENDENT_AMBULATORY_CARE_PROVIDER_SITE_OTHER): Payer: Managed Care, Other (non HMO) | Admitting: Obstetrics

## 2019-01-06 ENCOUNTER — Encounter: Payer: Self-pay | Admitting: Obstetrics

## 2019-01-06 ENCOUNTER — Other Ambulatory Visit (HOSPITAL_COMMUNITY)
Admission: RE | Admit: 2019-01-06 | Discharge: 2019-01-06 | Disposition: A | Discharge status: Home / Self Care | Payer: Managed Care, Other (non HMO) | Source: Ambulatory Visit | Attending: Obstetrics | Admitting: Obstetrics

## 2019-01-06 VITALS — BP 136/74 | HR 79 | Ht 65.0 in | Wt 225.8 lb

## 2019-01-06 DIAGNOSIS — N898 Other specified noninflammatory disorders of vagina: Secondary | ICD-10-CM | POA: Insufficient documentation

## 2019-01-06 DIAGNOSIS — Z3009 Encounter for other general counseling and advice on contraception: Secondary | ICD-10-CM | POA: Diagnosis not present

## 2019-01-06 DIAGNOSIS — Z01419 Encounter for gynecological examination (general) (routine) without abnormal findings: Secondary | ICD-10-CM | POA: Diagnosis not present

## 2019-01-06 MED ORDER — PHENTERMINE HCL 37.5 MG PO CAPS: 37.5000 mg | ORAL_CAPSULE | ORAL | 2 refills | Status: DC

## 2019-01-06 NOTE — Progress Notes (Signed)
Pt presents for annual, pap, and all vaginal STD testing.  She is concerned about recent weight gain.

## 2019-01-06 NOTE — Progress Notes (Signed)
Subjective:        Valerie Robbins is a 33 y.o. female here for a routine exam.  Current complaints: Undesirable weight gain.    Personal health questionnaire:  Is patient Ashkenazi Jewish, have a family history of breast and/or ovarian cancer: no Is there a family history of uterine cancer diagnosed at age < 1450, gastrointestinal cancer, urinary tract cancer, family member who is a Personnel officerLynch syndrome-associated carrier: no Is the patient overweight and hypertensive, family history of diabetes, personal history of gestational diabetes, preeclampsia or PCOS: no Is patient over 4855, have PCOS,  family history of premature CHD under age 35, diabetes, smoke, have hypertension or peripheral artery disease:  no At any time, has a partner hit, kicked or otherwise hurt or frightened you?: no Over the past 2 weeks, have you felt down, depressed or hopeless?: no Over the past 2 weeks, have you felt little interest or pleasure in doing things?:no   Gynecologic History Patient's last menstrual period was 12/15/2018. Contraception: abstinence Last Pap: 2018. Results were: normal Last mammogram: n/a. Results were: n/a  Obstetric History OB History  Gravida Para Term Preterm AB Living  5 3 3  0 2 3  SAB TAB Ectopic Multiple Live Births  1 0 1 0 3    # Outcome Date GA Lbr Len/2nd Weight Sex Delivery Anes PTL Lv  5 Term 03/02/12 [id number] 06:45 / 00:02 7 lb 8.6 oz (3.42 kg) M Vag-Vacuum EPI  LIV     Birth Comments: WNL  4 SAB           3 Ectopic           2 Term     M Vag-Spont   LIV  1 Term     M Vag-Spont  Y LIV    Past Medical History:  Diagnosis Date  . Abnormal Pap smear   . Anxiety and depression   . Asthma    childhood  . Atypical chest pain 09/01/2016   ED visit: +chest wall TTP.  D-dimer neg, TnI neg, CXR neg, EKG nl, no hypoxia.  . Family history of congenital hearing loss    [facility]  . History of uterine fibroid   . Metrorrhagia    with pelvic  pain.  Transvag+transabd pelvic u/s NORMAL 05/2014.  Marland Kitchen. Recurrent genital herpes    HSV 2    Past Surgical History:  Procedure Laterality Date  . COLPOSCOPY W/ BIOPSY / CURETTAGE     needs annual paps     Current Outpatient Medications:  .  citalopram (CELEXA) 40 MG tablet, Take 1 tablet (40 mg total) by mouth daily. (Patient not taking: Reported on 01/06/2019), Disp: 30 tablet, Rfl: 1 .  cyclobenzaprine (FLEXERIL) 10 MG tablet, Take 1 tablet (10 mg total) by mouth every 8 (eight) hours as needed for muscle spasms. (Patient not taking: Reported on 01/06/2019), Disp: 30 tablet, Rfl: 1 .  furosemide (LASIX) 20 MG tablet, 1 tab po qd AS NEEDED for lower legs/feet swelling (Patient not taking: Reported on 01/06/2019), Disp: 30 tablet, Rfl: 0 .  LORazepam (ATIVAN) 0.5 MG tablet, TAKE 1 TO 2 TABLETS BY MOUTH TWICE DAILY AS NEEDED FOR SEVER ANXIETY (Patient not taking: Reported on 01/06/2019), Disp: 60 tablet, Rfl: 0 .  methocarbamol (ROBAXIN) 500 MG tablet, Take 1 tablet (500 mg total) by mouth every 8 (eight) hours as needed for muscle spasms. (Patient not taking: Reported on 01/06/2019), Disp: 30 tablet, Rfl: 1 .  phentermine 37.5 MG capsule, Take 1 capsule (37.5 mg total) by mouth every morning., Disp: 30 capsule, Rfl: 2 No Known Allergies  Social History   Tobacco Use  . Smoking status: Never Smoker  . Smokeless tobacco: Never Used  Substance Use Topics  . Alcohol use: Yes    Alcohol/week: 0.0 standard drinks    Comment: social     Family History  Problem Relation Age of Onset  . Hypertension Mother   . Depression Mother   . Deafness Mother   . Asthma Father   . Alcohol abuse Father   . Hypertension Maternal Grandmother   . Depression Maternal Grandmother   . Diabetes Maternal Grandmother   . Anxiety disorder Maternal Grandmother   . Deafness Maternal Grandfather   . Hearing loss Sister 0       both parents, sister, grandparents were all born deaf      Review of  Systems  Constitutional: negative for fatigue and weight loss Respiratory: negative for cough and wheezing Cardiovascular: negative for chest pain, fatigue and palpitations Gastrointestinal: negative for abdominal pain and change in bowel habits Musculoskeletal:negative for myalgias Neurological: negative for gait problems and tremors Behavioral/Psych: negative for abusive relationship, depression Endocrine: negative for temperature intolerance    Genitourinary:negative for abnormal menstrual periods, genital lesions, hot flashes, sexual problems and vaginal discharge Integument/breast: negative for breast lump, breast tenderness, nipple discharge and skin lesion(s)    Objective:       BP 136/74   Pulse 79   Ht 5\' 5"  (1.651 m)   Wt 225 lb 12.8 oz (102.4 kg)   LMP 12/15/2018   BMI 37.58 kg/m  General:   alert  Skin:   no rash or abnormalities  Lungs:   clear to auscultation bilaterally  Heart:   regular rate and rhythm, S1, S2 normal, no murmur, click, rub or gallop  Breasts:   normal without suspicious masses, skin or nipple changes or axillary nodes  Abdomen:  normal findings: no organomegaly, soft, non-tender and no hernia  Pelvis:  External genitalia: normal general appearance Urinary system: urethral meatus normal and bladder without fullness, nontender Vaginal: normal without tenderness, induration or masses Cervix: normal appearance Adnexa: normal bimanual exam Uterus: anteverted and non-tender, normal size   Lab Review Urine pregnancy test Labs reviewed yes Radiologic studies reviewed no  50% of 20 min visit spent on counseling and coordination of care.   Assessment and Plan:     1. Encounter for routine gynecological examination with Papanicolaou smear of cervix Rx: - Cytology - PAP  2. Vaginal discharge Rx: - Cervicovaginal ancillary only( [place])  3. Encounter for other general counseling and advice on contraception - not sexually active  4.  Class 3 severe obesity due to excess calories without serious comorbidity in adult, unspecified BMI (HCC) Rx: - phentermine 37.5 MG capsule; Take 1 capsule (37.5 mg total) by mouth every morning.  Dispense: 30 capsule; Refill: 2    Plan:     Follow up in 1 year  Meds ordered this encounter  Medications  . phentermine 37.5 MG capsule    Sig: Take 1 capsule (37.5 mg total) by mouth every morning.    Dispense:  30 capsule    Refill:  2   No orders of the defined types were placed in this encounter.   Brock Bad MD 01-06-2019

## 2019-01-07 LAB — CERVICOVAGINAL ANCILLARY ONLY
Bacterial vaginitis: NEGATIVE
CANDIDA VAGINITIS: NEGATIVE
CHLAMYDIA, DNA PROBE: NEGATIVE
NEISSERIA GONORRHEA: NEGATIVE
TRICH (WINDOWPATH): NEGATIVE

## 2019-01-08 LAB — CYTOLOGY - PAP
Diagnosis: NEGATIVE
HPV: NOT DETECTED

## 2019-01-15 ENCOUNTER — Ambulatory Visit (INDEPENDENT_AMBULATORY_CARE_PROVIDER_SITE_OTHER): Payer: 59 | Admitting: Clinical

## 2019-01-15 DIAGNOSIS — F4312 Post-traumatic stress disorder, chronic: Secondary | ICD-10-CM

## 2019-01-20 ENCOUNTER — Ambulatory Visit (INDEPENDENT_AMBULATORY_CARE_PROVIDER_SITE_OTHER): Payer: 59 | Admitting: Clinical

## 2019-01-20 DIAGNOSIS — F4320 Adjustment disorder, unspecified: Secondary | ICD-10-CM | POA: Diagnosis not present

## 2019-01-26 ENCOUNTER — Ambulatory Visit (INDEPENDENT_AMBULATORY_CARE_PROVIDER_SITE_OTHER): Payer: 59 | Admitting: Clinical

## 2019-01-26 DIAGNOSIS — F4312 Post-traumatic stress disorder, chronic: Secondary | ICD-10-CM | POA: Diagnosis not present

## 2019-02-03 ENCOUNTER — Ambulatory Visit: Payer: 59 | Admitting: Clinical

## 2019-02-17 ENCOUNTER — Ambulatory Visit: Payer: 59 | Admitting: Clinical

## 2019-03-02 ENCOUNTER — Telehealth: Payer: Self-pay

## 2019-03-02 ENCOUNTER — Other Ambulatory Visit: Payer: Self-pay | Admitting: Obstetrics

## 2019-03-02 DIAGNOSIS — B9689 Other specified bacterial agents as the cause of diseases classified elsewhere: Secondary | ICD-10-CM

## 2019-03-02 DIAGNOSIS — N760 Acute vaginitis: Principal | ICD-10-CM

## 2019-03-02 DIAGNOSIS — B373 Candidiasis of vulva and vagina: Secondary | ICD-10-CM

## 2019-03-02 DIAGNOSIS — B3731 Acute candidiasis of vulva and vagina: Secondary | ICD-10-CM

## 2019-03-02 MED ORDER — FLUCONAZOLE 200 MG PO TABS: 200.0000 mg | ORAL_TABLET | ORAL | 2 refills | Status: DC

## 2019-03-02 MED ORDER — METRONIDAZOLE 500 MG PO TABS: 500.0000 mg | ORAL_TABLET | Freq: Two times a day (BID) | ORAL | 2 refills | Status: DC

## 2019-03-02 NOTE — Telephone Encounter (Signed)
Patient called and would like to receive a phone call from Dr. Clearance Coots, she would not say what it was about.

## 2019-03-04 ENCOUNTER — Other Ambulatory Visit: Payer: Self-pay

## 2019-03-04 ENCOUNTER — Ambulatory Visit (INDEPENDENT_AMBULATORY_CARE_PROVIDER_SITE_OTHER): Payer: 59 | Admitting: Clinical

## 2019-03-04 DIAGNOSIS — F4312 Post-traumatic stress disorder, chronic: Secondary | ICD-10-CM | POA: Diagnosis not present

## 2019-03-12 ENCOUNTER — Ambulatory Visit (INDEPENDENT_AMBULATORY_CARE_PROVIDER_SITE_OTHER): Payer: 59 | Admitting: Clinical

## 2019-03-12 DIAGNOSIS — F4312 Post-traumatic stress disorder, chronic: Secondary | ICD-10-CM | POA: Diagnosis not present

## 2019-03-13 ENCOUNTER — Other Ambulatory Visit: Payer: Self-pay | Admitting: Family Medicine

## 2019-03-13 ENCOUNTER — Encounter: Payer: Self-pay | Admitting: Family Medicine

## 2019-03-13 MED ORDER — CITALOPRAM HYDROBROMIDE 40 MG PO TABS: 40.0000 mg | ORAL_TABLET | Freq: Every day | ORAL | 0 refills | Status: DC

## 2019-03-13 NOTE — Addendum Note (Signed)
Addended by: Smitty Knudsen on: 03/13/2019 04:45 PM   Modules accepted: Orders

## 2019-03-13 NOTE — Telephone Encounter (Signed)
RF request for lorazepam LOV: 11/05/18 Next ov: 03/24/19 Last written: 11/05/18 #60 w/ 0RF  RF request for citalopram Last written: 11/05/18 #30 w/ 1RF  Please advise. Thanks.

## 2019-03-18 ENCOUNTER — Encounter: Payer: Self-pay | Admitting: Family Medicine

## 2019-03-18 DIAGNOSIS — H5213 Myopia, bilateral: Secondary | ICD-10-CM | POA: Diagnosis not present

## 2019-03-24 ENCOUNTER — Encounter: Payer: Self-pay | Admitting: Family Medicine

## 2019-03-24 ENCOUNTER — Other Ambulatory Visit: Payer: Self-pay

## 2019-03-24 ENCOUNTER — Ambulatory Visit (INDEPENDENT_AMBULATORY_CARE_PROVIDER_SITE_OTHER): Payer: Managed Care, Other (non HMO) | Admitting: Family Medicine

## 2019-03-24 DIAGNOSIS — F410 Panic disorder [episodic paroxysmal anxiety] without agoraphobia: Secondary | ICD-10-CM | POA: Diagnosis not present

## 2019-03-24 DIAGNOSIS — F4323 Adjustment disorder with mixed anxiety and depressed mood: Secondary | ICD-10-CM | POA: Diagnosis not present

## 2019-03-24 DIAGNOSIS — F430 Acute stress reaction: Secondary | ICD-10-CM

## 2019-03-24 DIAGNOSIS — F4310 Post-traumatic stress disorder, unspecified: Secondary | ICD-10-CM

## 2019-03-24 MED ORDER — LORAZEPAM 1 MG PO TABS: ORAL_TABLET | ORAL | 2 refills | Status: DC

## 2019-03-24 NOTE — Progress Notes (Signed)
Virtual Visit via Video Note  I connected with pt on 03/24/19 at 10:00 AM EDT by a video enabled telemedicine application and verified that I am speaking with the correct person using two identifiers.  Location patient: in her car. Location provider:work office Persons participating in the virtual visit: patient, provider  I discussed the limitations of evaluation and management by telemedicine and the availability of in person appointments. The patient expressed understanding and agreed to proceed.   HPI: Four month f/u acute stress reaction, panic attacks, PTSD. Citalopram maxed to 40mg  qd last visit, had been taking the ativan 0.5mg , 1-2 bid consistently. Was in the process of getting short and long term disability. Was seeing Dr. Dewayne Hatch for counseling but was being referred to PTSD counseling specialist. She is continuing to struggle, gets panicky nearly daily-->at least for brief periods.  Still finds herself unable to adequately focus or consistently act in a way that would allow her to work effectively at any type of job.  Is still getting some final paperwork completed in order to get long term disability payments.  She is compliant with her citalopram 40mg  qd w/out side effects. Taking 2 loraz 0.5mg  bid consistently.  No side effects.  She is getting very worried about a lot of things in addition to the original issues she was having: VERY over-worried about the coronavirus, how it may affect her or her kids, etc. Denies any significant prolonged depressed mood.   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 A news channel has reached out to her and requested an interview with her.   ROS: See pertinent positives and negatives per HPI.  Past Medical History:  Diagnosis Date  . Abnormal Pap smear   . Anxiety and depression   . Asthma    childhood  . Atypical chest pain 09/01/2016   ED visit: +chest wall TTP.  D-dimer neg,  TnI neg, CXR neg, EKG nl, no hypoxia.  . Family history of congenital hearing loss    Audiology eval at [facility] ENT normal.  . History of uterine fibroid   . Metrorrhagia    with pelvic pain.  Transvag+transabd pelvic u/s NORMAL 05/2014.  Marland Kitchen Recurrent genital herpes    HSV 2    Past Surgical History:  Procedure Laterality Date  . COLPOSCOPY W/ BIOPSY / CURETTAGE     needs annual paps    Family History  Problem Relation Age of Onset  . Hypertension Mother   . Depression Mother   . Deafness Mother   . Asthma Father   . Alcohol abuse Father   . Hypertension Maternal Grandmother   . Depression Maternal Grandmother   . Diabetes Maternal Grandmother   . Anxiety disorder Maternal Grandmother   . Deafness Maternal Grandfather   . Hearing loss Sister 0       both parents, sister, grandparents were all born deaf       Current Outpatient Medications:  .  citalopram (CELEXA) 40 MG tablet, Take 1 tablet (40 mg total) by mouth daily., Disp: 30 tablet, Rfl: 0 .  cyclobenzaprine (FLEXERIL) 10 MG tablet, Take 1 tablet (10 mg total) by mouth every 8 (eight) hours as needed for muscle spasms. (Patient not taking: Reported on 01/06/2019), Disp: 30 tablet, Rfl: 1 .  fluconazole (DIFLUCAN) 200 MG tablet, Take 1 tablet (200 mg total) by mouth every 3 (three) days., Disp: 3 tablet, Rfl: 2 .  furosemide (LASIX) 20 MG tablet, 1  tab po qd AS NEEDED for lower legs/feet swelling (Patient not taking: Reported on 01/06/2019), Disp: 30 tablet, Rfl: 0 .  LORazepam (ATIVAN) 0.5 MG tablet, TAKE 1 TO 2 TABLETS BY MOUTH TWICE DAILY AS NEEDED FOR SEVERE ANXIETY, Disp: 60 tablet, Rfl: 0 .  methocarbamol (ROBAXIN) 500 MG tablet, Take 1 tablet (500 mg total) by mouth every 8 (eight) hours as needed for muscle spasms. (Patient not taking: Reported on 01/06/2019), Disp: 30 tablet, Rfl: 1 .  metroNIDAZOLE (FLAGYL) 500 MG tablet, Take 1 tablet (500 mg total) by mouth 2 (two) times daily., Disp: 14 tablet, Rfl: 2 .  phentermine  37.5 MG capsule, Take 1 capsule (37.5 mg total) by mouth every morning., Disp: 30 capsule, Rfl: 2  EXAM:  VITALS per patient if applicable:  GENERAL: alert, oriented, appears well and in no acute distress  HEENT: atraumatic, conjuntiva clear, no obvious abnormalities on inspection of external nose and ears  NECK: normal movements of the head and neck  LUNGS: on inspection no signs of respiratory distress, breathing rate appears normal, no obvious gross SOB, gasping or wheezing  CV: no obvious cyanosis  MS: moves all visible extremities without noticeable abnormality  PSYCH/NEURO: pleasant and cooperative, no obvious depression or anxiety, speech and thought processing grossly intact  ASSESSMENT AND PLAN:  Discussed the following assessment and plan:  Severe anxiety/acute stress reaction/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  She is still struggling a lot but overall pretty stable, but we'll see if she'll benefit from an increase in her lorazepam. I did eRx for lorazepam 1mg  tabs, take 1-2 bid prn severe anxiety, #90 for 1 mo supply, RF x 2. Therapeutic expectations and side effect profile of medication discussed today.  Patient's questions answered. Continue citalopram 40mg  qd and continue weekly counseling with Dr. Dewayne Hatch.   I discussed the assessment and treatment plan with the patient. The patient was provided an opportunity to ask questions and all were answered. The patient agreed with the plan and demonstrated an understanding of the instructions.   The patient was advised to call back or seek an in-person evaluation if the symptoms worsen or if the condition fails to improve as anticipated.  F/u:  3 mo in office   Signed:  Santiago Bumpers, MD           03/24/2019

## 2019-03-25 ENCOUNTER — Telehealth: Payer: Self-pay

## 2019-03-25 NOTE — Telephone Encounter (Signed)
Received disability forms from pt's insurance. Placed on provider's desk for completion.

## 2019-03-25 NOTE — Telephone Encounter (Signed)
Form has been received. Placed in physician's folder.

## 2019-03-26 ENCOUNTER — Ambulatory Visit: Payer: 59 | Admitting: Clinical

## 2019-03-27 ENCOUNTER — Ambulatory Visit (INDEPENDENT_AMBULATORY_CARE_PROVIDER_SITE_OTHER): Payer: Medicaid Other | Admitting: Clinical

## 2019-03-27 DIAGNOSIS — F4312 Post-traumatic stress disorder, chronic: Secondary | ICD-10-CM | POA: Diagnosis not present

## 2019-04-06 ENCOUNTER — Ambulatory Visit: Payer: 59 | Admitting: Clinical

## 2019-04-06 ENCOUNTER — Encounter: Payer: Self-pay | Admitting: Family Medicine

## 2019-04-06 DIAGNOSIS — F411 Generalized anxiety disorder: Secondary | ICD-10-CM | POA: Insufficient documentation

## 2019-04-06 NOTE — Telephone Encounter (Signed)
I filled paperwork out and put it in the bin to go up front.

## 2019-04-12 ENCOUNTER — Ambulatory Visit (INDEPENDENT_AMBULATORY_CARE_PROVIDER_SITE_OTHER): Payer: Medicaid Other | Admitting: Clinical

## 2019-04-12 DIAGNOSIS — F4312 Post-traumatic stress disorder, chronic: Secondary | ICD-10-CM | POA: Diagnosis not present

## 2019-04-14 ENCOUNTER — Ambulatory Visit: Payer: 59 | Admitting: Clinical

## 2019-04-14 ENCOUNTER — Encounter: Payer: Self-pay | Admitting: Family Medicine

## 2019-04-16 ENCOUNTER — Ambulatory Visit: Payer: Medicaid Other | Admitting: Family Medicine

## 2019-04-16 ENCOUNTER — Encounter: Payer: Self-pay | Admitting: Family Medicine

## 2019-04-16 ENCOUNTER — Other Ambulatory Visit: Payer: Self-pay

## 2019-04-16 DIAGNOSIS — A6000 Herpesviral infection of urogenital system, unspecified: Secondary | ICD-10-CM

## 2019-04-16 NOTE — Progress Notes (Signed)
Virtual Visit via Video Note  I connected with Valerie Robbins  on 04/16/19 at  2:20 PM EDT by telephone b/c technology difficulties with video visit prevented video--> and verified that I am speaking with the correct person using two identifiers.  Location patient: home Location provider:work or home office Persons participating in the virtual visit: patient, provider  I discussed the limitations of evaluation and management by telemedicine and the availability of in person appointments. The patient expressed understanding and agreed to proceed.  Telemedicine/telephone visit is a necessity given the COVID-19 restrictions in place at the current time.  HPI: 33 y/o AAF called to discuss valtrex. She has hx of genital HSV and has been on valtrex in the past.  She asked if we had hx of ever prescribing this med for her and I reviewed her list and we have not been rx'ing this for her.  She said that she would call her OB/GYN MD office to request this med since they are the likely prescribers for her for this med in the past.   ROS: See pertinent positives and negatives per HPI.  Past Medical History:  Diagnosis Date  . Abnormal Pap smear   . Anxiety and depression   . Asthma    childhood  . Atypical chest pain 09/01/2016   ED visit: +chest wall TTP.  D-dimer neg, TnI neg, CXR neg, EKG nl, no hypoxia.  . Family history of congenital hearing loss    [facility]  . History of uterine fibroid   . Metrorrhagia    with pelvic pain.  Transvag+transabd pelvic u/s NORMAL 05/2014.  Marland Kitchen. Peripheral edema    bilat LL's: suspected venous insuff 06/2019; low dose prn lasix rx'd.  . Recurrent genital herpes    HSV 2    Past Surgical History:  Procedure Laterality Date  . COLPOSCOPY W/ BIOPSY / CURETTAGE     needs annual paps    Family History  Problem Relation Age of Onset  . Hypertension Mother   . Depression Mother   . Deafness Mother   . Asthma Father   . Alcohol abuse Father    . Hypertension Maternal Grandmother   . Depression Maternal Grandmother   . Diabetes Maternal Grandmother   . Anxiety disorder Maternal Grandmother   . Deafness Maternal Grandfather   . Hearing loss Sister 0       both parents, sister, grandparents were all born deaf     Current Outpatient Medications:  .  citalopram (CELEXA) 40 MG tablet, Take 1 tablet (40 mg total) by mouth daily., Disp: 30 tablet, Rfl: 0 .  cyclobenzaprine (FLEXERIL) 10 MG tablet, Take 1 tablet (10 mg total) by mouth every 8 (eight) hours as needed for muscle spasms. (Patient not taking: Reported on 01/06/2019), Disp: 30 tablet, Rfl: 1 .  LORazepam (ATIVAN) 1 MG tablet, 1-2 tabs po bid prn severe anxiety, Disp: 90 tablet, Rfl: 2 .  methocarbamol (ROBAXIN) 500 MG tablet, Take 1 tablet (500 mg total) by mouth every 8 (eight) hours as needed for muscle spasms. (Patient not taking: Reported on 01/06/2019), Disp: 30 tablet, Rfl: 1 .  phentermine 37.5 MG capsule, Take 1 capsule (37.5 mg total) by mouth every morning. (Patient not taking: Reported on 03/24/2019), Disp: 30 capsule, Rfl: 2  EXAM:  VITALS per patient if applicable:  GENERAL: alert, oriented, appears well and in no acute distress  HEENT: atraumatic, conjunttiva clear, no obvious abnormalities on inspection of external nose and ears  NECK: normal movements of the head and neck  LUNGS: on inspection no signs of respiratory distress, breathing rate appears normal, no obvious gross SOB, gasping or wheezing  CV: no obvious cyanosis  MS: moves all visible extremities without noticeable abnormality  PSYCH/NEURO: pleasant and cooperative, no obvious depression or anxiety, speech and thought processing grossly intact  LABS: none today   Chemistry      Component Value Date/Time   NA 139 07/15/2018 1153   K 4.3 07/15/2018 1153   CL 107 07/15/2018 1153   CO2 28 07/15/2018 1153   BUN 14 07/15/2018 1153   CREATININE 0.95 07/15/2018 1153      Component Value  Date/Time   CALCIUM 8.8 07/15/2018 1153   ALKPHOS 26 (L) 07/15/2018 1153   AST 17 07/15/2018 1153   ALT 19 07/15/2018 1153   BILITOT 0.6 07/15/2018 1153     Lab Results  Component Value Date   TSH 3.14 07/15/2018   Lab Results  Component Value Date   WBC 4.1 11/01/2017   HGB 12.9 11/01/2017   HCT 40.3 11/01/2017   MCV 91.7 11/01/2017   PLT 368.0 11/01/2017    ASSESSMENT AND PLAN:  Discussed the following assessment and plan:  Genital HSV, recurrent. GYN is actually the prescriber for her valtrex, so she decided just to call their office.   I discussed the assessment and treatment plan with the patient. The patient was provided an opportunity to ask questions and all were answered. The patient agreed with the plan and demonstrated an understanding of the instructions.   The patient was advised to call back or seek an in-person evaluation if the symptoms worsen or if the condition fails to improve as anticipated.  F/u: as prev scheduled.  Signed:  Santiago Bumpers, MD           04/16/2019

## 2019-04-26 ENCOUNTER — Ambulatory Visit: Payer: 59 | Admitting: Clinical

## 2019-04-29 ENCOUNTER — Telehealth: Payer: Self-pay

## 2019-04-29 NOTE — Telephone Encounter (Signed)
Forms were filled out by PCP and faxed to number provided on paperwork. Copy was made for pt's chart.

## 2019-04-29 NOTE — Telephone Encounter (Signed)
Received paperwork regarding disability claim, placed on provider's desk to review and sign.

## 2019-04-30 NOTE — Telephone Encounter (Signed)
Noted. Agree.

## 2019-05-05 ENCOUNTER — Encounter: Payer: Self-pay | Admitting: Family Medicine

## 2019-05-06 ENCOUNTER — Encounter: Payer: Self-pay | Admitting: Family Medicine

## 2019-05-06 ENCOUNTER — Ambulatory Visit (INDEPENDENT_AMBULATORY_CARE_PROVIDER_SITE_OTHER): Payer: Medicaid Other | Admitting: Family Medicine

## 2019-05-06 ENCOUNTER — Other Ambulatory Visit: Payer: Self-pay

## 2019-05-06 DIAGNOSIS — K047 Periapical abscess without sinus: Secondary | ICD-10-CM

## 2019-05-06 MED ORDER — AMOXICILLIN-POT CLAVULANATE 875-125 MG PO TABS: 1.0000 | ORAL_TABLET | Freq: Two times a day (BID) | ORAL | 0 refills | Status: DC

## 2019-05-06 NOTE — Telephone Encounter (Signed)
Pt was called and stated she went to the dentist and referral was placed for her to have the tooth pulled. The dentist told her she needed to see her PCP to get abx. Virtual visit was made.

## 2019-05-06 NOTE — Progress Notes (Signed)
Virtual Visit via Video Note  I connected with pt on 05/06/19 at  1:20 PM EDT by a video enabled telemedicine application and verified that I am speaking with the correct person using two identifiers.  Location patient: home Location provider:work or home office Persons participating in the virtual visit: patient, provider  I discussed the limitations of evaluation and management by telemedicine and the availability of in person appointments. The patient expressed understanding and agreed to proceed.  Telemedicine visit is a necessity given the COVID-19 restrictions in place at the current time.  HPI: 33 y/o AAF being seen today for tooth pain.  Onset of pain in a mandibular molar yesterday, some swelling of soft tissue around lower jaw region.  Tender to touch.  No fevers. She does have a split/eroded tooth in the area. Her dentist is [employer] in [place]. She says they told her to call in 5 days to make an appt b/c they are not open right now. She took 800 mg ibuprofen x 2 times and says it did not help any.   ROS: See pertinent positives and negatives per HPI.  Past Medical History:  Diagnosis Date  . Abnormal Pap smear   . Anxiety and depression   . Asthma    childhood  . Atypical chest pain 09/01/2016   ED visit: +chest wall TTP.  D-dimer neg, TnI neg, CXR neg, EKG nl, no hypoxia.  . Family history of congenital hearing loss    Audiology eval at [facility] ENT normal.  . History of uterine fibroid   . Metrorrhagia    with pelvic pain.  Transvag+transabd pelvic u/s NORMAL 05/2014.  Marland Kitchen Peripheral edema    bilat LL's: suspected venous insuff 06/2019; low dose prn lasix rx'd.  . Recurrent genital herpes    HSV 2    Past Surgical History:  Procedure Laterality Date  . COLPOSCOPY W/ BIOPSY / CURETTAGE     needs annual paps    Family History  Problem Relation Age of Onset  . Hypertension Mother   . Depression Mother   . Deafness Mother   . Asthma Father   . Alcohol abuse  Father   . Hypertension Maternal Grandmother   . Depression Maternal Grandmother   . Diabetes Maternal Grandmother   . Anxiety disorder Maternal Grandmother   . Deafness Maternal Grandfather   . Hearing loss Sister 0       both parents, sister, grandparents were all born deaf      Current Outpatient Medications:  .  citalopram (CELEXA) 40 MG tablet, Take 1 tablet (40 mg total) by mouth daily., Disp: 30 tablet, Rfl: 0 .  LORazepam (ATIVAN) 1 MG tablet, 1-2 tabs po bid prn severe anxiety, Disp: 90 tablet, Rfl: 2 .  cyclobenzaprine (FLEXERIL) 10 MG tablet, Take 1 tablet (10 mg total) by mouth every 8 (eight) hours as needed for muscle spasms. (Patient not taking: Reported on 01/06/2019), Disp: 30 tablet, Rfl: 1 .  methocarbamol (ROBAXIN) 500 MG tablet, Take 1 tablet (500 mg total) by mouth every 8 (eight) hours as needed for muscle spasms. (Patient not taking: Reported on 01/06/2019), Disp: 30 tablet, Rfl: 1 .  phentermine 37.5 MG capsule, Take 1 capsule (37.5 mg total) by mouth every morning. (Patient not taking: Reported on 03/24/2019), Disp: 30 capsule, Rfl: 2  EXAM:  VITALS per patient if applicable: There were no vitals taken for this visit.   GENERAL: alert, oriented, appears well and in no acute distress  HEENT: atraumatic,  conjunttiva clear, no obvious abnormalities on inspection of external nose and ears I cannot see any swelling/asymmetry of the jaw or neck. I am unable to visualize her oral cavity or mandibular/maxillary molar region.  NECK: normal movements of the head and neck  LUNGS: on inspection no signs of respiratory distress, breathing rate appears normal, no obvious gross SOB, gasping or wheezing  CV: no obvious cyanosis  MS: moves all visible extremities without noticeable abnormality  PSYCH/NEURO: pleasant and cooperative, no obvious depression or anxiety, speech and thought processing grossly intact  LABS: none today    Chemistry      Component Value  Date/Time   NA 139 07/15/2018 1153   K 4.3 07/15/2018 1153   CL 107 07/15/2018 1153   CO2 28 07/15/2018 1153   BUN 14 07/15/2018 1153   CREATININE 0.95 07/15/2018 1153      Component Value Date/Time   CALCIUM 8.8 07/15/2018 1153   ALKPHOS 26 (L) 07/15/2018 1153   AST 17 07/15/2018 1153   ALT 19 07/15/2018 1153   BILITOT 0.6 07/15/2018 1153     Lab Results  Component Value Date   WBC 4.1 11/01/2017   HGB 12.9 11/01/2017   HCT 40.3 11/01/2017   MCV 91.7 11/01/2017   PLT 368.0 11/01/2017   Lab Results  Component Value Date   TSH 3.14 07/15/2018   Lab Results  Component Value Date   CHOL 141 11/01/2017   HDL 38.50 (L) 11/01/2017   LDLCALC 91 11/01/2017   TRIG 55.0 11/01/2017   CHOLHDL 4 11/01/2017   ASSESSMENT AND PLAN:  Discussed the following assessment and plan:  Dental infection: I emphasized to pt that the definitive treatment for this is extraction or other local/procedural treatment by her dentist, but I'll send in augmentin 875mg  bid x 10d to try to help in the meantime.  She states she will be able to get in with her dentist next week (Monday) when it opens. Continue ibuprofen 800mg  tid prn with food for pain.  I discussed the assessment and treatment plan with the patient. The patient was provided an opportunity to ask questions and all were answered. The patient agreed with the plan and demonstrated an understanding of the instructions.   The patient was advised to call back or seek an in-person evaluation if the symptoms worsen or if the condition fails to improve as anticipated.  F/u: if not improving  Signed:  Santiago BumpersPhil Asuncion Tapscott, MD           05/06/2019

## 2019-05-10 ENCOUNTER — Ambulatory Visit: Payer: Medicaid Other | Admitting: Clinical

## 2019-05-24 ENCOUNTER — Ambulatory Visit: Payer: Medicaid Other | Admitting: Clinical

## 2019-06-07 ENCOUNTER — Ambulatory Visit: Payer: Medicaid Other | Admitting: Clinical

## 2019-06-21 ENCOUNTER — Ambulatory Visit: Payer: Medicaid Other | Admitting: Clinical

## 2019-08-07 ENCOUNTER — Emergency Department (HOSPITAL_COMMUNITY): Admit: 2019-08-07 | Discharge: 2019-08-07 | Discharge status: Absent without leave | Payer: Medicaid Other

## 2019-09-02 ENCOUNTER — Telehealth: Payer: Self-pay | Admitting: Family Medicine

## 2019-09-02 NOTE — Telephone Encounter (Signed)
Patient made appt via MyChart with Dr. Anitra Lauth, first available was 09/17/19. Patient calls into office stating she needs to be sooner. Patient reports numbness & tingling in her foot and leg, swelling in her foot/ankles.  This has been going on "for awhile" patient reports. I told her Dr. Anitra Lauth does not have any openings, that was his first available appt.  I told her her symptoms were not "acute". Not sure if another Provider at another office could schedule her. She asked to speak to triage nurse. She feels this is an emergent issue and wants to be seen asap. Spoke with Roderic Ovens, Clinical Supervisor, she directed me to send to clinical team for Dr. Anitra Lauth  Thank you!

## 2019-09-02 NOTE — Telephone Encounter (Signed)
No sooner appt on PCP schedule available. Please advise, thanks.

## 2019-09-02 NOTE — Telephone Encounter (Signed)
Sorry this is not an emergent/urgent issue. Needs to take first available appt--no work in.-thx

## 2019-09-03 NOTE — Telephone Encounter (Signed)
Patient advised, she will keep appt scheduled 09/17/2019 @ 11am.

## 2019-09-04 DIAGNOSIS — R252 Cramp and spasm: Secondary | ICD-10-CM | POA: Diagnosis not present

## 2019-09-04 DIAGNOSIS — R201 Hypoesthesia of skin: Secondary | ICD-10-CM | POA: Diagnosis not present

## 2019-09-04 NOTE — Telephone Encounter (Signed)
Patient states we directed her to go to an urgent care if her symptoms worsen/no improvement and she could not wait until her appt on 9/24  Patient states she went to an urgent care today. While drawing blood, they blue her vein, and was not able to draw blood.  She is requesting Dr. Anitra Lauth write orders for the tests they are requiring her to have CBC and CMP...she thought. I told her we could not do labs for an outside provider.   Please advise.  Please call her at [phone]

## 2019-09-04 NOTE — Telephone Encounter (Signed)
Please advise if having labs is appropriate here?

## 2019-09-07 NOTE — Telephone Encounter (Signed)
Correct. I cannot draw these labs.

## 2019-09-07 NOTE — Telephone Encounter (Signed)
Patient has been added to the wait list

## 2019-09-07 NOTE — Telephone Encounter (Signed)
Patient advised and understands.  Patient requesting to be placed on cancellation list in case there is an earlier appointment than 09/17/2019

## 2019-09-17 ENCOUNTER — Ambulatory Visit: Payer: Medicaid Other | Admitting: Family Medicine

## 2019-10-08 ENCOUNTER — Encounter: Payer: Self-pay | Admitting: Family Medicine

## 2019-10-12 ENCOUNTER — Encounter: Payer: Self-pay | Admitting: Family Medicine

## 2019-10-12 DIAGNOSIS — S93401A Sprain of unspecified ligament of right ankle, initial encounter: Secondary | ICD-10-CM | POA: Diagnosis not present

## 2019-10-12 DIAGNOSIS — S29012A Strain of muscle and tendon of back wall of thorax, initial encounter: Secondary | ICD-10-CM | POA: Diagnosis not present

## 2019-10-12 DIAGNOSIS — M62838 Other muscle spasm: Secondary | ICD-10-CM | POA: Diagnosis not present

## 2019-10-12 DIAGNOSIS — S838X2A Sprain of other specified parts of left knee, initial encounter: Secondary | ICD-10-CM | POA: Diagnosis not present

## 2019-10-12 DIAGNOSIS — S46819A Strain of other muscles, fascia and tendons at shoulder and upper arm level, unspecified arm, initial encounter: Secondary | ICD-10-CM | POA: Diagnosis not present

## 2019-10-19 ENCOUNTER — Encounter: Payer: Medicaid Other | Admitting: Family Medicine

## 2019-10-19 NOTE — Progress Notes (Deleted)
Office Note 10/19/2019  CC: No chief complaint on file.   HPI:  Valerie Robbins is a 33 y.o. Black female who is here for annual health maintenance exam. Pt has extensive GYN hx, has been managed by Dr. Jodi Mourning.  Past Medical History:  Diagnosis Date  . Abnormal Pap smear   . Anxiety and depression   . Asthma    childhood  . Atypical chest pain 09/01/2016   ED visit: +chest wall TTP.  D-dimer neg, TnI neg, CXR neg, EKG nl, no hypoxia.  . Family history of congenital hearing loss    Audiology eval at [facility] ENT normal.  . History of uterine fibroid   . Metrorrhagia    with pelvic pain.  Transvag+transabd pelvic u/s NORMAL 05/2014.  [facility] Peripheral edema    bilat LL's: suspected venous insuff 06/2019; low dose prn lasix rx'd.  . Recurrent genital herpes    HSV 2    Past Surgical History:  Procedure Laterality Date  . COLPOSCOPY W/ BIOPSY / CURETTAGE     needs annual paps    Family History  Problem Relation Age of Onset  . Hypertension Mother   . Depression Mother   . Deafness Mother   . Asthma Father   . Alcohol abuse Father   . Hypertension Maternal Grandmother   . Depression Maternal Grandmother   . Diabetes Maternal Grandmother   . Anxiety disorder Maternal Grandmother   . Deafness Maternal Grandfather   . Hearing loss Sister 0       both parents, sister, grandparents were all born deaf    Social History   Socioeconomic History  . Marital status: Single    Spouse name: Not on file  . Number of children: 3  . Years of education: 48  . Highest education level: Not on file  Occupational History  . Not on file  Social Needs  . Financial resource strain: Not on file  . Food insecurity    Worry: Not on file    Inability: Not on file  . Transportation needs    Medical: Not on file    Non-medical: Not on file  Tobacco Use  . Smoking status: Never Smoker  . Smokeless tobacco: Never Used  Substance and Sexual Activity  . Alcohol use: Yes   Alcohol/week: 0.0 standard drinks    Comment: social   . Drug use: No  . Sexual activity: Not Currently    Partners: Male    Birth control/protection: None  Lifestyle  . Physical activity    Days per week: Not on file    Minutes per session: Not on file  . Stress: Not on file  Relationships  . Social Herbalist on phone: Not on file    Gets together: Not on file    Attends religious service: Not on file    Active member of club or organization: Not on file    Attends meetings of clubs or organizations: Not on file    Relationship status: Not on file  . Intimate partner violence    Fear of current or ex partner: Not on file    Emotionally abused: Not on file    Physically abused: Not on file    Forced sexual activity: Not on file  Other Topics Concern  . Not on file  Social History Narrative   Single, 3 sons.   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   No Tob, occ alcohol, no drugs.  No exercise.   Fun: [name] out with her children.   Denies abuse and feels safe at home.     Outpatient Medications Prior to Visit  Medication Sig Dispense Refill  . amoxicillin-clavulanate (AUGMENTIN) 875-125 MG tablet Take 1 tablet by mouth 2 (two) times daily. 20 tablet 0  . citalopram (CELEXA) 40 MG tablet Take 1 tablet (40 mg total) by mouth daily. 30 tablet 0  . cyclobenzaprine (FLEXERIL) 10 MG tablet Take 1 tablet (10 mg total) by mouth every 8 (eight) hours as needed for muscle spasms. (Patient not taking: Reported on 01/06/2019) 30 tablet 1  . LORazepam (ATIVAN) 1 MG tablet 1-2 tabs po bid prn severe anxiety 90 tablet 2  . methocarbamol (ROBAXIN) 500 MG tablet Take 1 tablet (500 mg total) by mouth every 8 (eight) hours as needed for muscle spasms. (Patient not taking: Reported on 01/06/2019) 30 tablet 1  . phentermine 37.5 MG capsule Take 1 capsule (37.5 mg total) by mouth every morning. (Patient not taking: Reported on 03/24/2019) 30 capsule 2   No facility-administered  medications prior to visit.     No Known Allergies  ROS *** PE; There were no vitals taken for this visit. *** Pertinent labs:  Lab Results  Component Value Date   TSH 3.14 07/15/2018   Lab Results  Component Value Date   WBC 4.1 11/01/2017   HGB 12.9 11/01/2017   HCT 40.3 11/01/2017   MCV 91.7 11/01/2017   PLT 368.0 11/01/2017   Lab Results  Component Value Date   CREATININE 0.95 07/15/2018   BUN 14 07/15/2018   NA 139 07/15/2018   K 4.3 07/15/2018   CL 107 07/15/2018   CO2 28 07/15/2018   Lab Results  Component Value Date   ALT 19 07/15/2018   AST 17 07/15/2018   ALKPHOS 26 (L) 07/15/2018   BILITOT 0.6 07/15/2018   Lab Results  Component Value Date   CHOL 141 11/01/2017   Lab Results  Component Value Date   HDL 38.50 (L) 11/01/2017   Lab Results  Component Value Date   LDLCALC 91 11/01/2017   Lab Results  Component Value Date   TRIG 55.0 11/01/2017   Lab Results  Component Value Date   CHOLHDL 4 11/01/2017    ASSESSMENT AND PLAN:   Health maintenance exam: Reviewed age and gender appropriate health maintenance issues (prudent diet, regular exercise, health risks of tobacco and excessive alcohol, use of seatbelts, fire alarms in home, use of sunscreen).  Also reviewed age and gender appropriate health screening as well as vaccine recommendations. Vaccines: Tdap due->***  Flu vaccine->*** Labs: fasting HP labs. Cervical ca screening: hx of abnormal pap and colposcopy and curettage, needs annual pap.  GYN MD is *** Breast ca screening: start annual mammograms at age 38. Colon ca screening:  average risk patient= as per latest guidelines, start screening at 41 yrs of age.   An After Visit Summary was printed and given to the patient.  FOLLOW UP:  No follow-ups on file.  Signed:  Santiago Bumpers, MD           10/19/2019

## 2019-11-24 ENCOUNTER — Encounter: Payer: Self-pay | Admitting: Family Medicine

## 2019-11-24 ENCOUNTER — Other Ambulatory Visit: Payer: Self-pay

## 2019-11-24 ENCOUNTER — Ambulatory Visit (INDEPENDENT_AMBULATORY_CARE_PROVIDER_SITE_OTHER): Payer: Medicaid Other | Admitting: Family Medicine

## 2019-11-24 VITALS — BP 119/78 | HR 66 | Temp 98.4°F | Resp 16 | Ht 65.0 in | Wt 229.0 lb

## 2019-11-24 DIAGNOSIS — S161XXD Strain of muscle, fascia and tendon at neck level, subsequent encounter: Secondary | ICD-10-CM

## 2019-11-24 DIAGNOSIS — Z0000 Encounter for general adult medical examination without abnormal findings: Secondary | ICD-10-CM | POA: Diagnosis not present

## 2019-11-24 DIAGNOSIS — F411 Generalized anxiety disorder: Secondary | ICD-10-CM

## 2019-11-24 DIAGNOSIS — M546 Pain in thoracic spine: Secondary | ICD-10-CM

## 2019-11-24 LAB — CBC WITH DIFFERENTIAL/PLATELET
Basophils Absolute: 0 10*3/uL (ref 0.0–0.1)
Basophils Relative: 0.6 % (ref 0.0–3.0)
Eosinophils Absolute: 0.2 10*3/uL (ref 0.0–0.7)
Eosinophils Relative: 3 % (ref 0.0–5.0)
HCT: 40 % (ref 36.0–46.0)
Hemoglobin: 12.9 g/dL (ref 12.0–15.0)
Lymphocytes Relative: 50.5 % — ABNORMAL HIGH (ref 12.0–46.0)
Lymphs Abs: 3 10*3/uL (ref 0.7–4.0)
MCHC: 32.2 g/dL (ref 30.0–36.0)
MCV: 90.9 fl (ref 78.0–100.0)
Monocytes Absolute: 0.7 10*3/uL (ref 0.1–1.0)
Monocytes Relative: 11.4 % (ref 3.0–12.0)
Neutro Abs: 2 10*3/uL (ref 1.4–7.7)
Neutrophils Relative %: 34.5 % — ABNORMAL LOW (ref 43.0–77.0)
Platelets: 402 10*3/uL — ABNORMAL HIGH (ref 150.0–400.0)
RBC: 4.4 Mil/uL (ref 3.87–5.11)
RDW: 14.7 % (ref 11.5–15.5)
WBC: 5.9 10*3/uL (ref 4.0–10.5)

## 2019-11-24 LAB — COMPREHENSIVE METABOLIC PANEL
ALT: 18 U/L (ref 0–35)
AST: 16 U/L (ref 0–37)
Albumin: 4 g/dL (ref 3.5–5.2)
Alkaline Phosphatase: 26 U/L — ABNORMAL LOW (ref 39–117)
BUN: 22 mg/dL (ref 6–23)
CO2: 28 mEq/L (ref 19–32)
Calcium: 8.8 mg/dL (ref 8.4–10.5)
Chloride: 106 mEq/L (ref 96–112)
Creatinine, Ser: 0.83 mg/dL (ref 0.40–1.20)
GFR: 94.6 mL/min (ref >60.00)
Glucose, Bld: 85 mg/dL (ref 70–99)
Potassium: 4.8 mEq/L (ref 3.5–5.1)
Sodium: 139 mEq/L (ref 135–145)
Total Bilirubin: 0.6 mg/dL (ref 0.2–1.2)
Total Protein: 6.3 g/dL (ref 6.0–8.3)

## 2019-11-24 LAB — LIPID PANEL
Cholesterol: 135 mg/dL (ref 0–200)
HDL: 32.8 mg/dL — ABNORMAL LOW (ref >39.00)
LDL Cholesterol: 88 mg/dL (ref 0–99)
NonHDL: 102.33
Total CHOL/HDL Ratio: 4
Triglycerides: 70 mg/dL (ref 0.0–149.0)
VLDL: 14 mg/dL (ref 0.0–40.0)

## 2019-11-24 LAB — TSH: TSH: 2.2 u[IU]/mL (ref 0.35–4.50)

## 2019-11-24 MED ORDER — CYCLOBENZAPRINE HCL 10 MG PO TABS: 10.0000 mg | ORAL_TABLET | Freq: Three times a day (TID) | ORAL | 0 refills | Status: DC | PRN

## 2019-11-24 MED ORDER — LORAZEPAM 1 MG PO TABS: ORAL_TABLET | ORAL | 2 refills | Status: DC

## 2019-11-24 NOTE — Progress Notes (Signed)
Office Note 11/24/2019  CC:  Chief Complaint  Patient presents with  . Annual Exam    pt is fasting   HPI:  Valerie Robbins is a 33 y.o. Black female who is here for annual health maintenance exam.  Says she slipped at [employer] about 2 wks ago, on a wet spot on floor. She caught herself before falling and this sudden movement caused some neck and inner aspect of shoulders pain, also less intense pain stiffness diffusely.   Went to UC b/c entire body felt stiff/sore for a couple days.  Dx'd with strain per pt's best recollection. Took aleve-no signif help.  Muscle relaxer has been helpful for her in the past so she asks about this again.  Anxiety disorder: lorazepam 1mg , 1-2 bid prn. Reviewed PMP AWARE today.  Most recent loraz rx filled 03/24/19, #90, rx by me. She had alpraz 1mg , #2 rx'd by her dentist 08/25/19, also small amount of vicodin that same day. No red flags. This helps her anxiety.  No panic.  No side effects from med.  Takes it sometimes twice daily for a while and sometimes only a few doses a week.  Working with some behav mod techniques. Not taking antidepressant anymore per her preference.  Past Medical History:  Diagnosis Date  . Abnormal Pap smear   . Anxiety and depression   . Asthma    childhood  . Atypical chest pain 09/01/2016   ED visit: +chest wall TTP.  D-dimer neg, TnI neg, CXR neg, EKG nl, no hypoxia.  . Family history of congenital hearing loss    Audiology eval at [facility] ENT normal.  . History of uterine fibroid   . Metrorrhagia    with pelvic pain.  Transvag+transabd pelvic u/s NORMAL 05/2014.  [facility] Peripheral edema    bilat LL's: suspected venous insuff 06/2019; low dose prn lasix rx'd.  . Recurrent genital herpes    HSV 2    Past Surgical History:  Procedure Laterality Date  . COLPOSCOPY W/ BIOPSY / CURETTAGE     needs annual paps    Family History  Problem Relation Age of Onset  . Hypertension Mother   . Depression Mother   .  Deafness Mother   . Asthma Father   . Alcohol abuse Father   . Hypertension Maternal Grandmother   . Depression Maternal Grandmother   . Diabetes Maternal Grandmother   . Anxiety disorder Maternal Grandmother   . Deafness Maternal Grandfather   . Hearing loss Sister 0       both parents, sister, grandparents were all born deaf    Social History   Socioeconomic History  . Marital status: Single    Spouse name: Not on file  . Number of children: 3  . Years of education: 46  . Highest education level: Not on file  Occupational History  . Not on file  Social Needs  . Financial resource strain: Not on file  . Food insecurity    Worry: Not on file    Inability: Not on file  . Transportation needs    Medical: Not on file    Non-medical: Not on file  Tobacco Use  . Smoking status: Never Smoker  . Smokeless tobacco: Never Used  Substance and Sexual Activity  . Alcohol use: Yes    Alcohol/week: 0.0 standard drinks    Comment: social   . Drug use: No  . Sexual activity: Not Currently    Partners: Male    Birth  control/protection: None  Lifestyle  . Physical activity    Days per week: Not on file    Minutes per session: Not on file  . Stress: Not on file  Relationships  . Social Herbalist on phone: Not on file    Gets together: Not on file    Attends religious service: Not on file    Active member of club or organization: Not on file    Attends meetings of clubs or organizations: Not on file    Relationship status: Not on file  . Intimate partner violence    Fear of current or ex partner: Not on file    Emotionally abused: Not on file    Physically abused: Not on file    Forced sexual activity: Not on file  Other Topics Concern  . Not on file  Social History Narrative   Single, 3 sons.   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   No Tob, occ alcohol, no drugs.   No exercise.   Fun: Elbert Ewings out with her children.   Denies abuse and feels safe at  home.     Outpatient Medications Prior to Visit  Medication Sig Dispense Refill  . LORazepam (ATIVAN) 1 MG tablet 1-2 tabs po bid prn severe anxiety 90 tablet 2  . amoxicillin-clavulanate (AUGMENTIN) 875-125 MG tablet Take 1 tablet by mouth 2 (two) times daily. 20 tablet 0  . citalopram (CELEXA) 40 MG tablet Take 1 tablet (40 mg total) by mouth daily. (Patient not taking: Reported on 11/24/2019) 30 tablet 0  . cyclobenzaprine (FLEXERIL) 10 MG tablet Take 1 tablet (10 mg total) by mouth every 8 (eight) hours as needed for muscle spasms. (Patient not taking: Reported on 01/06/2019) 30 tablet 1  . methocarbamol (ROBAXIN) 500 MG tablet Take 1 tablet (500 mg total) by mouth every 8 (eight) hours as needed for muscle spasms. (Patient not taking: Reported on 01/06/2019) 30 tablet 1  . phentermine 37.5 MG capsule Take 1 capsule (37.5 mg total) by mouth every morning. (Patient not taking: Reported on 03/24/2019) 30 capsule 2   No facility-administered medications prior to visit.     No Known Allergies  ROS Review of Systems  Constitutional: Negative for appetite change, chills, fatigue and fever.  HENT: Negative for congestion, dental problem, ear pain and sore throat.   Eyes: Negative for discharge, redness and visual disturbance.  Respiratory: Negative for cough, chest tightness, shortness of breath and wheezing.   Cardiovascular: Negative for chest pain, palpitations and leg swelling.  Gastrointestinal: Negative for abdominal pain, blood in stool, diarrhea, nausea and vomiting.  Genitourinary: Negative for difficulty urinating, dysuria, flank pain, frequency, hematuria and urgency.  Musculoskeletal: Positive for arthralgias (neck). Negative for back pain, joint swelling, myalgias and neck stiffness.  Skin: Negative for pallor and rash.  Neurological: Negative for dizziness, speech difficulty, weakness and headaches.  Hematological: Negative for adenopathy. Does not bruise/bleed easily.   Psychiatric/Behavioral: Negative for confusion and sleep disturbance. The patient is not nervous/anxious.     PE; Blood pressure 119/78, pulse 66, temperature 98.4 F (36.9 C), temperature source Temporal, resp. rate 16, height 5\' 5"  (1.651 m), weight 229 lb (103.9 kg), SpO2 100 %. Body mass index is 38.11 kg/m. Exam chaperoned by Deveron Furlong, CMA.  Gen: Alert, well appearing.  Patient is oriented to person, place, time, and situation. AFFECT: pleasant, lucid thought and speech. ENT: Ears: EACs clear, normal epithelium.  TMs with good light reflex and landmarks bilaterally.  Eyes: no injection, icteris, swelling, or exudate.  EOMI, PERRLA. Nose: no drainage or turbinate edema/swelling.  No injection or focal lesion.  Mouth: lips without lesion/swelling.  Oral mucosa pink and moist.  Dentition intact and without obvious caries or gingival swelling.  Oropharynx without erythema, exudate, or swelling.  Neck: supple/nontender.  No LAD, mass, or TM.  Carotid pulses 2+ bilaterally, without bruits. CV: RRR, no m/r/g.   LUNGS: CTA bilat, nonlabored resps, good aeration in all lung fields. ABD: soft, NT, ND, BS normal.  No hepatospenomegaly or mass.  No bruits. EXT: no clubbing, cyanosis, or edema.  Musculoskeletal: no joint swelling, erythema, or warmth.  ROM of all joints intact. Mild TTP of soft tissues of neck diffusely.   Skin - no sores or suspicious lesions or rashes or color changes   Pertinent labs:  Lab Results  Component Value Date   TSH 3.14 07/15/2018   Lab Results  Component Value Date   WBC 4.1 11/01/2017   HGB 12.9 11/01/2017   HCT 40.3 11/01/2017   MCV 91.7 11/01/2017   PLT 368.0 11/01/2017   Lab Results  Component Value Date   CREATININE 0.95 07/15/2018   BUN 14 07/15/2018   NA 139 07/15/2018   K 4.3 07/15/2018   CL 107 07/15/2018   CO2 28 07/15/2018   Lab Results  Component Value Date   ALT 19 07/15/2018   AST 17 07/15/2018   ALKPHOS 26 (L) 07/15/2018    BILITOT 0.6 07/15/2018   Lab Results  Component Value Date   CHOL 141 11/01/2017   Lab Results  Component Value Date   HDL 38.50 (L) 11/01/2017   Lab Results  Component Value Date   LDLCALC 91 11/01/2017   Lab Results  Component Value Date   TRIG 55.0 11/01/2017   Lab Results  Component Value Date   CHOLHDL 4 11/01/2017     ASSESSMENT AND PLAN:   1) Acute cervical soft tissue strain: apply heat, continue ROM/stretching, rx'd flexeril 10mg  to use q8h prn, #30, no RF.  Therapeutic expectations and side effect profile of medication discussed today.  Patient's questions answered.  2) GAD: stable. No longer wants to be on antidepressant. She will continue lorazepam on prn basis. CSC signed/in chart today.  3) Health maintenance exam: Reviewed age and gender appropriate health maintenance issues (prudent diet, regular exercise, health risks of tobacco and excessive alcohol, use of seatbelts, fire alarms in home, use of sunscreen).  Also reviewed age and gender appropriate health screening as well as vaccine recommendations. Vaccines: flu->she declined this.  Tdap->she declines b/c she thinks she has had this within the last 10 yrs and wants to check and get back with us. Labs: fasting HP today. Cervical ca screening: hx of CIN, hx of colpo, needs annual pap smears->has been followed by Dr. Clearance CootsHarper, GYN MD.  She is due for f/u and says she will make appt for this. Breast ca screening: annual BSE encouraged.  Needs annual mammography starting age 35.  An After Visit Summary was printed and given to the patient.  FOLLOW UP:  Return in about 6 months (around 05/24/2020) for f/u anxiety/benzo.  Signed:  Santiago BumpersPhil , MD           11/24/2019

## 2019-11-24 NOTE — Patient Instructions (Signed)
Health Maintenance, Female Adopting a healthy lifestyle and getting preventive care are important in promoting health and wellness. Ask your health care provider about:  The right schedule for you to have regular tests and exams.  Things you can do on your own to prevent diseases and keep yourself healthy. What should I know about diet, weight, and exercise? Eat a healthy diet   Eat a diet that includes plenty of vegetables, fruits, low-fat dairy products, and lean protein.  Do not eat a lot of foods that are high in solid fats, added sugars, or sodium. Maintain a healthy weight Body mass index (BMI) is used to identify weight problems. It estimates body fat based on height and weight. Your health care provider can help determine your BMI and help you achieve or maintain a healthy weight. Get regular exercise Get regular exercise. This is one of the most important things you can do for your health. Most adults should:  Exercise for at least 150 minutes each week. The exercise should increase your heart rate and make you sweat (moderate-intensity exercise).  Do strengthening exercises at least twice a week. This is in addition to the moderate-intensity exercise.  Spend less time sitting. Even light physical activity can be beneficial. Watch cholesterol and blood lipids Have your blood tested for lipids and cholesterol at 35 years of age, then have this test every 5 years. Have your cholesterol levels checked more often if:  Your lipid or cholesterol levels are high.  You are older than 35 years of age.  You are at high risk for heart disease. What should I know about cancer screening? Depending on your health history and family history, you may need to have cancer screening at various ages. This may include screening for:  Breast cancer.  Cervical cancer.  Colorectal cancer.  Skin cancer.  Lung cancer. What should I know about heart disease, diabetes, and high blood  pressure? Blood pressure and heart disease  High blood pressure causes heart disease and increases the risk of stroke. This is more likely to develop in people who have high blood pressure readings, are of African descent, or are overweight.  Have your blood pressure checked: ? Every 3-5 years if you are 18-39 years of age. ? Every year if you are 40 years old or older. Diabetes Have regular diabetes screenings. This checks your fasting blood sugar level. Have the screening done:  Once every three years after age 40 if you are at a normal weight and have a low risk for diabetes.  More often and at a younger age if you are overweight or have a high risk for diabetes. What should I know about preventing infection? Hepatitis B If you have a higher risk for hepatitis B, you should be screened for this virus. Talk with your health care provider to find out if you are at risk for hepatitis B infection. Hepatitis C Testing is recommended for:  Everyone born from 1945 through 1965.  Anyone with known risk factors for hepatitis C. Sexually transmitted infections (STIs)  Get screened for STIs, including gonorrhea and chlamydia, if: ? You are sexually active and are younger than 35 years of age. ? You are older than 35 years of age and your health care provider tells you that you are at risk for this type of infection. ? Your sexual activity has changed since you were last screened, and you are at increased risk for chlamydia or gonorrhea. Ask your health care provider if   you are at risk.  Ask your health care provider about whether you are at high risk for HIV. Your health care provider may recommend a prescription medicine to help prevent HIV infection. If you choose to take medicine to prevent HIV, you should first get tested for HIV. You should then be tested every 3 months for as long as you are taking the medicine. Pregnancy  If you are about to stop having your period (premenopausal) and  you may become pregnant, seek counseling before you get pregnant.  Take 400 to 800 micrograms (mcg) of folic acid every day if you become pregnant.  Ask for birth control (contraception) if you want to prevent pregnancy. Osteoporosis and menopause Osteoporosis is a disease in which the bones lose minerals and strength with aging. This can result in bone fractures. If you are 65 years old or older, or if you are at risk for osteoporosis and fractures, ask your health care provider if you should:  Be screened for bone loss.  Take a calcium or vitamin D supplement to lower your risk of fractures.  Be given hormone replacement therapy (HRT) to treat symptoms of menopause. Follow these instructions at home: Lifestyle  Do not use any products that contain nicotine or tobacco, such as cigarettes, e-cigarettes, and chewing tobacco. If you need help quitting, ask your health care provider.  Do not use street drugs.  Do not share needles.  Ask your health care provider for help if you need support or information about quitting drugs. Alcohol use  Do not drink alcohol if: ? Your health care provider tells you not to drink. ? You are pregnant, may be pregnant, or are planning to become pregnant.  If you drink alcohol: ? Limit how much you use to 0-1 drink a day. ? Limit intake if you are breastfeeding.  Be aware of how much alcohol is in your drink. In the [place] one drink equals one 12 oz bottle of beer (355 mL), one 5 oz glass of wine (148 mL), or one 1 oz glass of hard liquor (44 mL). General instructions  Schedule regular health, dental, and eye exams.  Stay current with your vaccines.  Tell your health care provider if: ? You often feel depressed. ? You have ever been abused or do not feel safe at home. Summary  Adopting a healthy lifestyle and getting preventive care are important in promoting health and wellness.  Follow your health care provider's instructions about healthy  diet, exercising, and getting tested or screened for diseases.  Follow your health care provider's instructions on monitoring your cholesterol and blood pressure. This information is not intended to replace advice given to you by your health care provider. Make sure you discuss any questions you have with your health care provider. Document Released: 06/25/2011 Document Revised: 12/03/2018 Document Reviewed: 12/03/2018 Elsevier Patient Education  2020 [employer]  

## 2019-11-25 NOTE — Telephone Encounter (Signed)
The metabolic panel tests electrolyte levels, kidney function tests, liver function tests, and glucose level. These are all normal and reassuring that she has no sign of diabetes or any kidney or liver abnormality.   -thx

## 2019-11-25 NOTE — Telephone Encounter (Signed)
Patient would like to discuss recent results further with you. Please advise, thanks.

## 2019-11-25 NOTE — Telephone Encounter (Signed)
-----   Message from Tammi Sou, MD sent at 11/24/2019  9:24 PM EST ----- Please notify: all labs came back normal.

## 2019-12-15 ENCOUNTER — Encounter: Payer: Self-pay | Admitting: Family Medicine

## 2020-02-23 ENCOUNTER — Ambulatory Visit: Payer: Medicaid Other | Admitting: Obstetrics

## 2020-02-23 ENCOUNTER — Other Ambulatory Visit (HOSPITAL_COMMUNITY)
Admission: RE | Admit: 2020-02-23 | Discharge: 2020-02-23 | Disposition: A | Discharge status: Home / Self Care | Payer: Medicaid Other | Source: Ambulatory Visit | Attending: Obstetrics | Admitting: Obstetrics

## 2020-02-23 ENCOUNTER — Encounter: Payer: Self-pay | Admitting: Obstetrics

## 2020-02-23 ENCOUNTER — Other Ambulatory Visit: Payer: Self-pay

## 2020-02-23 VITALS — BP 133/91 | HR 81 | Ht 65.0 in | Wt 229.3 lb

## 2020-02-23 DIAGNOSIS — N898 Other specified noninflammatory disorders of vagina: Secondary | ICD-10-CM | POA: Diagnosis present

## 2020-02-23 DIAGNOSIS — M546 Pain in thoracic spine: Secondary | ICD-10-CM

## 2020-02-23 DIAGNOSIS — E669 Obesity, unspecified: Secondary | ICD-10-CM

## 2020-02-23 DIAGNOSIS — Z0000 Encounter for general adult medical examination without abnormal findings: Secondary | ICD-10-CM | POA: Diagnosis not present

## 2020-02-23 DIAGNOSIS — B009 Herpesviral infection, unspecified: Secondary | ICD-10-CM

## 2020-02-23 DIAGNOSIS — Z01419 Encounter for gynecological examination (general) (routine) without abnormal findings: Secondary | ICD-10-CM

## 2020-02-23 LAB — POCT URINALYSIS DIPSTICK
Bilirubin, UA: NEGATIVE
Blood, UA: NEGATIVE
Glucose, UA: NEGATIVE
Ketones, UA: NEGATIVE
Leukocytes, UA: NEGATIVE
Nitrite, UA: NEGATIVE
Protein, UA: NEGATIVE
Spec Grav, UA: 1.01 (ref 1.010–1.025)
Urobilinogen, UA: 0.2 E.U./dL
pH, UA: 7 (ref 5.0–8.0)

## 2020-02-23 MED ORDER — VALACYCLOVIR HCL 500 MG PO TABS: 500.0000 mg | ORAL_TABLET | Freq: Two times a day (BID) | ORAL | 99 refills | Status: DC

## 2020-02-23 MED ORDER — IBUPROFEN 800 MG PO TABS: 800.0000 mg | ORAL_TABLET | Freq: Three times a day (TID) | ORAL | 5 refills | Status: DC | PRN

## 2020-02-23 MED ORDER — CYCLOBENZAPRINE HCL 10 MG PO TABS: 10.0000 mg | ORAL_TABLET | Freq: Three times a day (TID) | ORAL | 2 refills | Status: DC | PRN

## 2020-02-23 NOTE — Progress Notes (Signed)
Subjective:        Valerie Robbins is a 34 y.o. female here for a routine exam.  Current complaints: Backache.    Personal health questionnaire:  Is patient Ashkenazi Jewish, have a family history of breast and/or ovarian cancer: no Is there a family history of uterine cancer diagnosed at age < 90, gastrointestinal cancer, urinary tract cancer, family member who is a Personnel officer syndrome-associated carrier: no Is the patient overweight and hypertensive, family history of diabetes, personal history of gestational diabetes, preeclampsia or PCOS: no Is patient over 37, have PCOS,  family history of premature CHD under age 55, diabetes, smoke, have hypertension or peripheral artery disease:  no At any time, has a partner hit, kicked or otherwise hurt or frightened you?: no Over the past 2 weeks, have you felt down, depressed or hopeless?: no Over the past 2 weeks, have you felt little interest or pleasure in doing things?:no   Gynecologic History Patient's last menstrual period was 02/02/2020. Contraception: abstinence Last Pap: 01-06-2019. Results were: normal Last mammogram: n/a. Results were: n/a  Obstetric History OB History  Gravida Para Term Preterm AB Living  5 3 3  0 2 3  SAB TAB Ectopic Multiple Live Births  1 0 1 0 3    # Outcome Date GA Lbr Len/2nd Weight Sex Delivery Anes PTL Lv  5 Term 03/02/12 [redacted]w[redacted]d 06:45 / 00:02 7 lb 8.6 oz (3.42 kg) M Vag-Vacuum EPI  LIV     Birth Comments: WNL  4 SAB           3 Ectopic           2 Term     M Vag-Spont   LIV  1 Term     M Vag-Spont  Y LIV    Past Medical History:  Diagnosis Date  . Abnormal Pap smear   . Anxiety and depression   . Asthma    childhood  . Atypical chest pain 09/01/2016   ED visit: +chest wall TTP.  D-dimer neg, TnI neg, CXR neg, EKG nl, no hypoxia.  . Family history of congenital hearing loss    Audiology eval at [facility] normal.  . History of uterine fibroid   . Metrorrhagia    with pelvic pain.   Transvag+transabd pelvic u/s NORMAL 05/2014.  06/2014 Peripheral edema    bilat LL's: suspected venous insuff 06/2019; low dose prn lasix rx'd.  . Recurrent genital herpes    HSV 2    Past Surgical History:  Procedure Laterality Date  . COLPOSCOPY W/ BIOPSY / CURETTAGE     needs annual paps     Current Outpatient Medications:  .  cyclobenzaprine (FLEXERIL) 10 MG tablet, Take 1 tablet (10 mg total) by mouth every 8 (eight) hours as needed for muscle spasms., Disp: 30 tablet, Rfl: 2 .  LORazepam (ATIVAN) 1 MG tablet, 1-2 tabs po bid prn severe anxiety, Disp: 90 tablet, Rfl: 2 .  ibuprofen (ADVIL) 800 MG tablet, Take 1 tablet (800 mg total) by mouth every 8 (eight) hours as needed., Disp: 30 tablet, Rfl: 5 .  valACYclovir (VALTREX) 500 MG tablet, Take 1 tablet (500 mg total) by mouth 2 (two) times daily., Disp: 30 tablet, Rfl: prn No Known Allergies  Social History   Tobacco Use  . Smoking status: Never Smoker  . Smokeless tobacco: Never Used  Substance Use Topics  . Alcohol use: Yes    Alcohol/week: 0.0 standard drinks    Comment: social  Family History  Problem Relation Age of Onset  . Hypertension Mother   . Depression Mother   . Deafness Mother   . Asthma Father   . Alcohol abuse Father   . Hypertension Maternal Grandmother   . Depression Maternal Grandmother   . Diabetes Maternal Grandmother   . Anxiety disorder Maternal Grandmother   . Deafness Maternal Grandfather   . Hearing loss Sister 0       both parents, sister, grandparents were all born deaf      Review of Systems  Constitutional: negative for fatigue and weight loss Respiratory: negative for cough and wheezing Cardiovascular: negative for chest pain, fatigue and palpitations Gastrointestinal: negative for abdominal pain and change in bowel habits Musculoskeletal:positive for myalgias - backache Neurological: negative for gait problems and tremors Behavioral/Psych: positive for anxiety /  depression Endocrine: negative for temperature intolerance    Genitourinary:negative for abnormal menstrual periods, genital lesions, hot flashes, sexual problems and vaginal discharge Integument/breast: negative for breast lump, breast tenderness, nipple discharge and skin lesion(s)    Objective:       BP (!) 133/91   Pulse 81   Ht 5\' 5"  (1.651 m)   Wt 229 lb 4.8 oz (104 kg)   LMP 02/02/2020   BMI 38.16 kg/m  General:   alert  Skin:   no rash or abnormalities  Lungs:   clear to auscultation bilaterally  Heart:   regular rate and rhythm, S1, S2 normal, no murmur, click, rub or gallop  Breasts:   normal without suspicious masses, skin or nipple changes or axillary nodes  Abdomen:  normal findings: no organomegaly, soft, non-tender and no hernia  Pelvis:  External genitalia: normal general appearance Urinary system: urethral meatus normal and bladder without fullness, nontender Vaginal: normal without tenderness, induration or masses Cervix: normal appearance Adnexa: normal bimanual exam Uterus: anteverted and non-tender, normal size   Lab Review Urine pregnancy test Labs reviewed yes Radiologic studies reviewed no  50% of 25 min visit spent on counseling and coordination of care.   Assessment:     1. Encounter for routine gynecological examination with Papanicolaou smear of cervix Rx: - POCT Urinalysis Dipstick - Cytology - PAP( [place])  2. Vaginal discharge Rx: - Cervicovaginal ancillary only( [place])  3. Acute midline thoracic back pain Rx: - cyclobenzaprine (FLEXERIL) 10 MG tablet; Take 1 tablet (10 mg total) by mouth every 8 (eight) hours as needed for muscle spasms.  Dispense: 30 tablet; Refill: 2 - ibuprofen (ADVIL) 800 MG tablet; Take 1 tablet (800 mg total) by mouth every 8 (eight) hours as needed.  Dispense: 30 tablet; Refill: 5  4. Obesity (BMI 35.0-39.9 without comorbidity) - program of caloric reduction, exercise and behavioral modification  recommended    Plan:    Education reviewed: calcium supplements, depression evaluation, low fat, low cholesterol diet, safe sex/STD prevention, self breast exams and weight bearing exercise. Contraception: abstinence. Follow up in: 1 year.   Meds ordered this encounter  Medications  . cyclobenzaprine (FLEXERIL) 10 MG tablet    Sig: Take 1 tablet (10 mg total) by mouth every 8 (eight) hours as needed for muscle spasms.    Dispense:  30 tablet    Refill:  2  . ibuprofen (ADVIL) 800 MG tablet    Sig: Take 1 tablet (800 mg total) by mouth every 8 (eight) hours as needed.    Dispense:  30 tablet    Refill:  5  . valACYclovir (VALTREX) 500 MG tablet  Sig: Take 1 tablet (500 mg total) by mouth 2 (two) times daily.    Dispense:  30 tablet    Refill:  prn   Orders Placed This Encounter  Procedures  . POCT Urinalysis Dipstick    Brock Bad, MD 02/23/2020 10:57 AM

## 2020-02-23 NOTE — Progress Notes (Signed)
Pt is here for annual gyn exam. Last pap 01/06/2019. Pt reports she is not currently sexually active and has not been for 3 years. Pt requests urine dip today, no urinary symptoms.

## 2020-02-24 LAB — CERVICOVAGINAL ANCILLARY ONLY
Bacterial Vaginitis (gardnerella): NEGATIVE
Candida Glabrata: NEGATIVE
Candida Vaginitis: NEGATIVE
Comment: NEGATIVE
Comment: NEGATIVE
Comment: NEGATIVE
Comment: NEGATIVE
Trichomonas: NEGATIVE

## 2020-02-24 LAB — CYTOLOGY - PAP
Comment: NEGATIVE
Diagnosis: NEGATIVE
High risk HPV: NEGATIVE

## 2020-02-25 ENCOUNTER — Encounter: Payer: Self-pay | Admitting: Family Medicine

## 2020-02-25 ENCOUNTER — Other Ambulatory Visit: Payer: Self-pay

## 2020-02-25 ENCOUNTER — Ambulatory Visit (INDEPENDENT_AMBULATORY_CARE_PROVIDER_SITE_OTHER): Payer: Medicaid Other | Admitting: Family Medicine

## 2020-02-25 VITALS — BP 130/84 | HR 78 | Temp 98.1°F | Resp 16 | Ht 65.0 in | Wt 229.3 lb

## 2020-02-25 DIAGNOSIS — F410 Panic disorder [episodic paroxysmal anxiety] without agoraphobia: Secondary | ICD-10-CM | POA: Diagnosis not present

## 2020-02-25 DIAGNOSIS — F411 Generalized anxiety disorder: Secondary | ICD-10-CM | POA: Diagnosis not present

## 2020-02-25 DIAGNOSIS — R4589 Other symptoms and signs involving emotional state: Secondary | ICD-10-CM | POA: Diagnosis not present

## 2020-02-25 MED ORDER — FLUOXETINE HCL 20 MG PO TABS: 20.0000 mg | ORAL_TABLET | Freq: Every day | ORAL | 0 refills | Status: DC

## 2020-02-25 NOTE — Progress Notes (Signed)
OFFICE VISIT  02/25/2020   CC:  Chief Complaint  Patient presents with  . Elevated blood pressure    pt is fasting   HPI:    Patient is a 34 y.o. African-American female who presents for elevated blood pressure. Lots of anxiety last couple weeks, takes care of ill grandmother, feeling very stressed and not eating well.  Tight shoulder/neck muscles and some HAs. Occ panic attacks: feeling of mild SOB, some chest tightness, clammy hands.  No dizziness.  Panicky sx's don't exactly peak but last a large part of her day.  Some agoraphobia at times.  Oldest son giving her issues.   Using lorazepam more lately due to this. Usually had been 1 per day, lately 2 per day.    Some intermittent depressed mood, not clear how often.  No hx of being on antidepressant.  No SI or HI.  No signif anhedonia.  Some guilt and occ hopelessness, occ crying spell.  Not occurring daily or persistently.  ROS: no fevers, no CP, no SOB, no wheezing, no cough, no dizziness,  no rashes, no melena/hematochezia.  No polyuria or polydipsia.  No myalgias or arthralgias.  No n/v/abd pain or diarrhea.  No vision or hearing c/o.   PMP AWARE reviewed today: most recent rx for lorazepam 1mg  was filled 12/15/19, # 90, rx by me. No red flags.  Past Medical History:  Diagnosis Date  . Abnormal Pap smear   . Anxiety and depression   . Asthma    childhood  . Atypical chest pain 09/01/2016   ED visit: +chest wall TTP.  D-dimer neg, TnI neg, CXR neg, EKG nl, no hypoxia.  . Family history of congenital hearing loss    Audiology eval at [facility]NT normal.  . History of uterine fibroid   . Metrorrhagia    with pelvic pain.  Transvag+transabd pelvic u/s NORMAL 05/2014.  06/2014 Peripheral edema    bilat LL's: suspected venous insuff 06/2019; low dose prn lasix rx'd.  . Recurrent genital herpes    HSV 2    Past Surgical History:  Procedure Laterality Date  . COLPOSCOPY W/ BIOPSY / CURETTAGE     needs annual paps    Outpatient  Medications Prior to Visit  Medication Sig Dispense Refill  . LORazepam (ATIVAN) 1 MG tablet 1-2 tabs po bid prn severe anxiety 90 tablet 2  . cyclobenzaprine (FLEXERIL) 10 MG tablet Take 1 tablet (10 mg total) by mouth every 8 (eight) hours as needed for muscle spasms. (Patient not taking: Reported on 02/25/2020) 30 tablet 2  . ibuprofen (ADVIL) 800 MG tablet Take 1 tablet (800 mg total) by mouth every 8 (eight) hours as needed. (Patient not taking: Reported on 02/25/2020) 30 tablet 5  . valACYclovir (VALTREX) 500 MG tablet Take 1 tablet (500 mg total) by mouth 2 (two) times daily. (Patient not taking: Reported on 02/25/2020) 30 tablet prn   No facility-administered medications prior to visit.    No Known Allergies  ROS As per HPI  PE: Blood pressure 130/84, pulse 78, temperature 98.1 F (36.7 C), temperature source Temporal, resp. rate 16, height 5\' 5"  (1.651 m), weight 229 lb 4.8 oz (104 kg), last menstrual period 02/02/2020, SpO2 100 %. Body mass index is 38.16 kg/m.  Wt Readings from Last 2 Encounters:  02/25/20 229 lb 4.8 oz (104 kg)  02/23/20 229 lb 4.8 oz (104 kg)    Gen: alert, oriented x 4, affect pleasant.  Lucid thinking and conversation noted. HEENT: PERRLA,  EOMI.   Neck: no LAD, mass, or thyromegaly. CV: RRR, no m/r/g LUNGS: CTA bilat, nonlabored. NEURO: no tremor or tics noted on observation.  Coordination intact. CN 2-12 grossly intact bilaterally, strength 5/5 in all extremeties.  No ataxia.   LABS:    Chemistry      Component Value Date/Time   NA 139 11/24/2019 1130   K 4.8 11/24/2019 1130   CL 106 11/24/2019 1130   CO2 28 11/24/2019 1130   BUN 22 11/24/2019 1130   CREATININE 0.83 11/24/2019 1130      Component Value Date/Time   CALCIUM 8.8 11/24/2019 1130   ALKPHOS 26 (L) 11/24/2019 1130   AST 16 11/24/2019 1130   ALT 18 11/24/2019 1130   BILITOT 0.6 11/24/2019 1130     Lab Results  Component Value Date   WBC 5.9 11/24/2019   HGB 12.9 11/24/2019    HCT 40.0 11/24/2019   MCV 90.9 11/24/2019   PLT 402.0 (H) 11/24/2019   Lab Results  Component Value Date   TSH 2.20 11/24/2019    IMPRESSION AND PLAN:  1) GAD with panic attacks and some agoraphobia and depressed mood (not clinically depressed). Start fluoxetine 20mg  qd.  Therapeutic expectations and side effect profile of medication discussed today.  Patient's questions answered. Continue lorazepam 1mg  qd-bid prn.  No new rx for this today.    An After Visit Summary was printed and given to the patient.  FOLLOW UP: Return in about 4 weeks (around 03/24/2020) for f/u anxiety/mood.  Signed:  Crissie Sickles, MD           02/25/2020

## 2020-03-01 ENCOUNTER — Telehealth: Payer: Self-pay

## 2020-03-01 MED ORDER — FLUOXETINE HCL 20 MG PO CAPS: 20.0000 mg | ORAL_CAPSULE | Freq: Every day | ORAL | 3 refills | Status: DC

## 2020-03-01 NOTE — Telephone Encounter (Signed)
OK, fluox caps eRx'd

## 2020-03-01 NOTE — Telephone Encounter (Signed)
Patient was given Rx for Fluoxetine 20mg  tab (30,0) on 02/25/20 but is not covered on pt's formulary, preferred would be capsules for the 20mg  dose.  Please advise, thanks.

## 2020-03-02 NOTE — Telephone Encounter (Signed)
Patient notified via My Chart

## 2020-03-15 ENCOUNTER — Encounter: Payer: Self-pay | Admitting: Family Medicine

## 2020-04-04 ENCOUNTER — Encounter: Payer: Self-pay | Admitting: Family Medicine

## 2020-04-04 DIAGNOSIS — F418 Other specified anxiety disorders: Secondary | ICD-10-CM

## 2020-04-04 DIAGNOSIS — F411 Generalized anxiety disorder: Secondary | ICD-10-CM

## 2020-04-04 DIAGNOSIS — F410 Panic disorder [episodic paroxysmal anxiety] without agoraphobia: Secondary | ICD-10-CM

## 2020-04-04 NOTE — Telephone Encounter (Signed)
Patient also takes Lorazepam 1mg , 1-2 tabs po prn for anxiety and wanted to confirm if it was okay to take this medication along with the fluoxetine. She is also inquiring about a referral to a counselor.  Please advise, thanks.

## 2020-04-04 NOTE — Telephone Encounter (Signed)
OK, psychologist/counseling referral ordered. It is ok to use lorazepam with the fluoxetine.

## 2020-08-02 ENCOUNTER — Ambulatory Visit: Payer: Medicaid Other | Admitting: Clinical

## 2020-08-03 ENCOUNTER — Telehealth (INDEPENDENT_AMBULATORY_CARE_PROVIDER_SITE_OTHER): Payer: Medicaid Other | Admitting: Obstetrics

## 2020-08-03 ENCOUNTER — Encounter: Payer: Self-pay | Admitting: Obstetrics

## 2020-08-03 DIAGNOSIS — E669 Obesity, unspecified: Secondary | ICD-10-CM

## 2020-08-03 DIAGNOSIS — Z3009 Encounter for other general counseling and advice on contraception: Secondary | ICD-10-CM | POA: Diagnosis not present

## 2020-08-03 DIAGNOSIS — Z30011 Encounter for initial prescription of contraceptive pills: Secondary | ICD-10-CM | POA: Diagnosis not present

## 2020-08-03 DIAGNOSIS — N946 Dysmenorrhea, unspecified: Secondary | ICD-10-CM

## 2020-08-03 MED ORDER — PHENTERMINE HCL 37.5 MG PO CAPS: 37.5000 mg | ORAL_CAPSULE | ORAL | 2 refills | Status: DC

## 2020-08-03 MED ORDER — BALCOLTRA 0.1-20 MG-MCG(21) PO TABS: 1.0000 | ORAL_TABLET | Freq: Every day | ORAL | 11 refills | Status: DC

## 2020-08-03 MED ORDER — IBUPROFEN 800 MG PO TABS: 800.0000 mg | ORAL_TABLET | Freq: Three times a day (TID) | ORAL | 5 refills | Status: DC | PRN

## 2020-08-03 MED ORDER — PHENTERMINE HCL 37.5 MG PO TABS: 37.5000 mg | ORAL_TABLET | Freq: Every day | ORAL | 2 refills | Status: DC

## 2020-08-03 MED ORDER — NORETHIN ACE-ETH ESTRAD-FE 1-20 MG-MCG(24) PO TABS: 1.0000 | ORAL_TABLET | Freq: Every day | ORAL | 11 refills | Status: DC

## 2020-08-03 NOTE — Addendum Note (Signed)
Addended by: Brock Bad on: 08/03/2020 09:53 AM   Modules accepted: Orders

## 2020-08-03 NOTE — Progress Notes (Signed)
GYNECOLOGY VIRTUAL VISIT ENCOUNTER NOTE  Provider location: [facility] [place]   I connected with Valerie Robbins on 08/03/20 at  8:15 AM EDT by MyChart Video Encounter at home and verified that I am speaking with the correct person using two identifiers.   I discussed the limitations, risks, security and privacy concerns of performing an evaluation and management service virtually and the availability of in person appointments. I also discussed with the patient that there may be a patient responsible charge related to this service. The patient expressed understanding and agreed to proceed.   History:  Valerie Robbins is a 34 y.o. [phone] female being evaluated today for contraceptive consult and to discuss weight management. She denies any abnormal vaginal discharge, bleeding, pelvic pain or other concerns.       Past Medical History:  Diagnosis Date  . Abnormal Pap smear   . Anxiety and depression   . Asthma    childhood  . Atypical chest pain 09/01/2016   ED visit: +chest wall TTP.  D-dimer neg, TnI neg, CXR neg, EKG nl, no hypoxia.  . Family history of congenital hearing loss    Audiology eval at [facility] normal.  . History of uterine fibroid   . Metrorrhagia    with pelvic pain.  Transvag+transabd pelvic u/s NORMAL 05/2014.  Marland Kitchen Peripheral edema    bilat LL's: suspected venous insuff 06/2019; low dose prn lasix rx'd.  . Recurrent genital herpes    HSV 2   Past Surgical History:  Procedure Laterality Date  . COLPOSCOPY W/ BIOPSY / CURETTAGE     needs annual paps   The following portions of the patient's history were reviewed and updated as appropriate: allergies, current medications, past family history, past medical history, past social history, past surgical history and problem list.   Health Maintenance:  Normal pap and negative HRHPV on 02-23-2020.   Review of Systems:  Pertinent items noted in HPI and remainder of comprehensive ROS  otherwise negative.  Physical Exam:   General:  Alert, oriented and cooperative. Patient appears to be in no acute distress.  Mental Status: Normal mood and affect. Normal behavior. Normal judgment and thought content.   Respiratory: Normal respiratory effort, no problems with respiration noted  Rest of physical exam deferred due to type of encounter  Labs and Imaging No results found for this or any previous visit (from the past 336 hour(s)). No results found.     Assessment and Plan:     1. Encounter for other general counseling and advice on contraception - wants OCP's  2. Dysmenorrhea Rx: - ibuprofen (ADVIL) 800 MG tablet; Take 1 tablet (800 mg total) by mouth every 8 (eight) hours as needed.  Dispense: 30 tablet; Refill: 5  3. Obesity (BMI 35.0-39.9 without comorbidity) Rx: - phentermine 37.5 MG capsule; Take 1 capsule (37.5 mg total) by mouth every morning.  Dispense: 30 capsule; Refill: 2  4. Encounter for initial prescription of contraceptive pills Rx: - Levonorgest-Eth Estrad-Fe Bisg (BALCOLTRA) 0.1-20 MG-MCG(21) TABS; Take 1 tablet by mouth daily.  Dispense: 28 tablet; Refill: 11       I discussed the assessment and treatment plan with the patient. The patient was provided an opportunity to ask questions and all were answered. The patient agreed with the plan and demonstrated an understanding of the instructions.   The patient was advised to call back or seek an in-person evaluation/go to the ED if the symptoms worsen or  if the condition fails to improve as anticipated.  I provided 15 minutes of face-to-face time during this encounter.   Coral Ceo, MD [facility] 08/03/20

## 2020-08-03 NOTE — Progress Notes (Signed)
Virtual Visit via Telephone Note  I connected with Valerie Robbins on 08/03/20 at  8:15 AM EDT by telephone and verified that I am speaking with the correct person using two identifiers.  Pt requests to start BCP to help with dysmenorrhea.  Pt also requests Phentermine rx.

## 2020-09-09 ENCOUNTER — Ambulatory Visit
Admission: RE | Admit: 2020-09-09 | Discharge: 2020-09-09 | Disposition: A | Discharge status: Home / Self Care | Payer: Medicaid Other | Source: Ambulatory Visit | Attending: Physician Assistant | Admitting: Physician Assistant

## 2020-09-09 ENCOUNTER — Other Ambulatory Visit: Payer: Self-pay

## 2020-09-09 ENCOUNTER — Ambulatory Visit (INDEPENDENT_AMBULATORY_CARE_PROVIDER_SITE_OTHER): Payer: Medicaid Other

## 2020-09-09 VITALS — BP 121/74 | HR 73 | Temp 98.4°F | Resp 18 | Ht 65.0 in | Wt 233.6 lb

## 2020-09-09 DIAGNOSIS — S199XXA Unspecified injury of neck, initial encounter: Secondary | ICD-10-CM | POA: Diagnosis not present

## 2020-09-09 DIAGNOSIS — S4992XA Unspecified injury of left shoulder and upper arm, initial encounter: Secondary | ICD-10-CM

## 2020-09-09 DIAGNOSIS — S161XXA Strain of muscle, fascia and tendon at neck level, initial encounter: Secondary | ICD-10-CM

## 2020-09-09 DIAGNOSIS — Z041 Encounter for examination and observation following transport accident: Secondary | ICD-10-CM | POA: Diagnosis not present

## 2020-09-09 DIAGNOSIS — M25512 Pain in left shoulder: Secondary | ICD-10-CM | POA: Diagnosis not present

## 2020-09-09 MED ORDER — DICLOFENAC SODIUM 75 MG PO TBEC: 75.0000 mg | DELAYED_RELEASE_TABLET | Freq: Two times a day (BID) | ORAL | 0 refills | Status: AC

## 2020-09-09 MED ORDER — CYCLOBENZAPRINE HCL 10 MG PO TABS: 10.0000 mg | ORAL_TABLET | Freq: Three times a day (TID) | ORAL | 0 refills | Status: AC | PRN
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
## 2020-09-09 NOTE — Discharge Instructions (Addendum)
NECK PAIN: Stressed avoiding painful activities. This can exacerbate your symptoms and make them worse.  May apply heat to the areas of pain for some relief. Use medications as directed. Be aware of which medications make you drowsy and do not drive or operate any kind of heavy machinery while using the medication (ie pain medications or muscle relaxers). F/U with PCP for reexamination or return sooner if condition worsens or does not begin to improve over the next few days.   NECK PAIN RED FLAGS: If symptoms get worse than they are right now, you should come back sooner for re-evaluation. If you have increased numbness/ tingling or notice that the numbness/tingling is affecting the legs or saddle region, go to ER. If you ever lose continence go to ER.      You have a condition that may require you to follow up with Orthopedics so please call one of the following office for appointment:   [facility] [address] [place], [place] [address] Phone: [phone]  [facility] [address] [place], [place] [address] Phone: [phone]
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
## 2020-09-14 ENCOUNTER — Ambulatory Visit: Payer: Medicaid Other | Admitting: Family Medicine

## 2020-09-15 ENCOUNTER — Encounter: Payer: Self-pay | Admitting: Family Medicine

## 2020-09-15 ENCOUNTER — Other Ambulatory Visit: Payer: Self-pay

## 2020-09-15 ENCOUNTER — Ambulatory Visit (INDEPENDENT_AMBULATORY_CARE_PROVIDER_SITE_OTHER): Payer: Medicaid Other | Admitting: Family Medicine

## 2020-09-15 VITALS — BP 125/78 | HR 70 | Temp 97.9°F | Resp 16 | Ht 65.0 in | Wt 240.0 lb

## 2020-09-15 DIAGNOSIS — N939 Abnormal uterine and vaginal bleeding, unspecified: Secondary | ICD-10-CM | POA: Diagnosis not present

## 2020-09-15 DIAGNOSIS — S40012D Contusion of left shoulder, subsequent encounter: Secondary | ICD-10-CM

## 2020-09-15 DIAGNOSIS — M25512 Pain in left shoulder: Secondary | ICD-10-CM

## 2020-09-15 DIAGNOSIS — S161XXD Strain of muscle, fascia and tendon at neck level, subsequent encounter: Secondary | ICD-10-CM

## 2020-09-15 LAB — BASIC METABOLIC PANEL
BUN: 14 mg/dL (ref 6–23)
CO2: 28 mEq/L (ref 19–32)
Calcium: 8.7 mg/dL (ref 8.4–10.5)
Chloride: 108 mEq/L (ref 96–112)
Creatinine, Ser: 1.06 mg/dL (ref 0.40–1.20)
GFR: 71.01 mL/min (ref >60.00)
Glucose, Bld: 96 mg/dL (ref 70–99)
Potassium: 4.6 mEq/L (ref 3.5–5.1)
Sodium: 140 mEq/L (ref 135–145)

## 2020-09-15 LAB — CBC
HCT: 43 % (ref 36.0–46.0)
Hemoglobin: 13.8 g/dL (ref 12.0–15.0)
MCHC: 32.1 g/dL (ref 30.0–36.0)
MCV: 91.7 fl (ref 78.0–100.0)
Platelets: 405 10*3/uL — ABNORMAL HIGH (ref 150.0–400.0)
RBC: 4.69 Mil/uL (ref 3.87–5.11)
RDW: 15 % (ref 11.5–15.5)
WBC: 6.1 10*3/uL (ref 4.0–10.5)

## 2020-09-15 LAB — POCT URINE PREGNANCY: Preg Test, Ur: NEGATIVE

## 2020-09-15 MED ORDER — TRAMADOL HCL 50 MG PO TABS: ORAL_TABLET | ORAL | 0 refills | Status: DC

## 2020-09-15 MED ORDER — LORAZEPAM 1 MG PO TABS
ORAL_TABLET | ORAL | 2 refills | Status: DC
Start: 2020-09-15 — End: 2021-06-19
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
## 2020-09-16 ENCOUNTER — Encounter: Payer: Self-pay | Admitting: Family Medicine

## 2020-09-16 DIAGNOSIS — S161XXD Strain of muscle, fascia and tendon at neck level, subsequent encounter: Secondary | ICD-10-CM

## 2020-09-16 DIAGNOSIS — M25512 Pain in left shoulder: Secondary | ICD-10-CM

## 2020-09-16 DIAGNOSIS — S40012D Contusion of left shoulder, subsequent encounter: Secondary | ICD-10-CM

## 2020-09-19 ENCOUNTER — Encounter: Payer: Self-pay | Admitting: Family Medicine

## 2020-09-19 NOTE — Telephone Encounter (Signed)
OK, PT referral submitted. Pls cancel the orthopedic surgery referral I ordered 09/15/20.-thx

## 2020-09-19 NOTE — Telephone Encounter (Signed)
Please advise, thanks.

## 2020-09-20 ENCOUNTER — Ambulatory Visit (INDEPENDENT_AMBULATORY_CARE_PROVIDER_SITE_OTHER): Payer: Medicaid Other | Admitting: Obstetrics

## 2020-09-20 ENCOUNTER — Other Ambulatory Visit: Payer: Self-pay

## 2020-09-20 ENCOUNTER — Encounter: Payer: Self-pay | Admitting: Obstetrics

## 2020-09-20 ENCOUNTER — Other Ambulatory Visit (HOSPITAL_COMMUNITY)
Admission: RE | Admit: 2020-09-20 | Discharge: 2020-09-20 | Disposition: A | Discharge status: Home / Self Care | Payer: Medicaid Other | Source: Ambulatory Visit | Attending: Obstetrics | Admitting: Obstetrics

## 2020-09-20 VITALS — BP 130/83 | HR 77 | Wt 241.0 lb

## 2020-09-20 DIAGNOSIS — M25512 Pain in left shoulder: Secondary | ICD-10-CM | POA: Diagnosis not present

## 2020-09-20 DIAGNOSIS — N939 Abnormal uterine and vaginal bleeding, unspecified: Secondary | ICD-10-CM | POA: Diagnosis not present

## 2020-09-20 DIAGNOSIS — E669 Obesity, unspecified: Secondary | ICD-10-CM | POA: Diagnosis not present

## 2020-09-20 DIAGNOSIS — Z3041 Encounter for surveillance of contraceptive pills: Secondary | ICD-10-CM | POA: Diagnosis not present

## 2020-09-20 DIAGNOSIS — M25612 Stiffness of left shoulder, not elsewhere classified: Secondary | ICD-10-CM | POA: Diagnosis not present

## 2020-09-20 DIAGNOSIS — N898 Other specified noninflammatory disorders of vagina: Secondary | ICD-10-CM

## 2020-09-20 DIAGNOSIS — M542 Cervicalgia: Secondary | ICD-10-CM | POA: Diagnosis not present

## 2020-09-20 MED ORDER — NORGESTIM-ETH ESTRAD TRIPHASIC 0.18/0.215/0.25 MG-35 MCG PO TABS: 1.0000 | ORAL_TABLET | Freq: Every day | ORAL | 11 refills | Status: DC

## 2020-09-20 NOTE — Progress Notes (Signed)
Patient ID: Valerie Robbins, female   DOB: 1986-09-04, 34 y.o.   MRN: [id number]  No chief complaint on file.   Valerie Robbins is a 34 y.o. female.  Complains of 3 episodes of vaginal bleeding during the month of September.  Has been on Loestrin 1/20 Fe x 2 months. Valerie  Past Medical History:  Diagnosis Date  . Abnormal Pap smear   . Anxiety and depression   . Asthma    childhood  . Atypical chest pain 09/01/2016   ED visit: +chest wall TTP.  D-dimer neg, TnI neg, CXR neg, EKG nl, no hypoxia.  . Family history of congenital hearing loss    Audiology eval at [facility] ENT normal.  . History of uterine fibroid   . Metrorrhagia    with pelvic pain.  Transvag+transabd pelvic u/s NORMAL 05/2014.  [facility] Peripheral edema    bilat LL's: suspected venous insuff 06/2019; low dose prn lasix rx'd.  . Recurrent genital herpes    HSV 2    Past Surgical History:  Procedure Laterality Date  . COLPOSCOPY W/ BIOPSY / CURETTAGE     needs annual paps    Family History  Problem Relation Age of Onset  . Hypertension Mother   . Depression Mother   . Deafness Mother   . Asthma Father   . Alcohol abuse Father   . Hypertension Maternal Grandmother   . Depression Maternal Grandmother   . Diabetes Maternal Grandmother   . Anxiety disorder Maternal Grandmother   . Deafness Maternal Grandfather   . Hearing loss Sister 0       both parents, sister, grandparents were all born deaf    Social History Social History   Tobacco Use  . Smoking status: Never Smoker  . Smokeless tobacco: Never Used  Vaping Use  . Vaping Use: Never used  Substance Use Topics  . Alcohol use: Yes    Alcohol/week: 0.0 standard drinks    Comment: social   . Drug use: No    No Known Allergies  Current Outpatient Medications  Medication Sig Dispense Refill  . diclofenac (VOLTAREN) 75 MG EC tablet Take 1 tablet (75 mg total) by mouth 2 (two) times daily for 15 days. (Patient not taking: Reported on 09/20/2020)  30 tablet 0  . LORazepam (ATIVAN) 1 MG tablet 1-2 tabs po bid prn severe anxiety (Patient not taking: Reported on 09/20/2020) 90 tablet 2  . Norgestimate-Ethinyl Estradiol Triphasic 0.18/0.215/0.25 MG-35 MCG tablet Take 1 tablet by mouth daily. 28 tablet 11  . traMADol (ULTRAM) 50 MG tablet 1-2 tabs po q6h prn pain (Patient not taking: Reported on 09/20/2020) 30 tablet 0   No current facility-administered medications for this visit.    Review of Systems Review of Systems Constitutional: negative for fatigue and weight loss Respiratory: negative for cough and wheezing Cardiovascular: negative for chest pain, fatigue and palpitations Gastrointestinal: negative for abdominal pain and change in bowel habits Genitourinary: positive for AUB Integument/breast: negative for nipple discharge Musculoskeletal:negative for myalgias Neurological: negative for gait problems and tremors Behavioral/Psych: negative for abusive relationship, depression Endocrine: negative for temperature intolerance      Blood pressure 130/83, pulse 77, weight 241 lb (109.3 kg), last menstrual period 08/28/2020.  Physical Exam Physical Exam          General:  Alert and no distress Abdomen:  normal findings: no organomegaly, soft, non-tender and no hernia  Pelvis:  External genitalia: normal general appearance Urinary system: urethral meatus normal and bladder  without fullness, nontender Vaginal: normal without tenderness, induration or masses Cervix: normal appearance Adnexa: normal bimanual exam Uterus: anteverted and non-tender, normal size    50% of 15 min visit spent on counseling and coordination of care.   Data Reviewed Wet Prep Cultures  Assessment     1. Vaginal discharge Rx: - Cervicovaginal ancillary only( [facility])  2. Abnormal uterine bleeding (AUB) - R/O Chlamydia  3. Acute pain of left shoulder - has been referred to PT  4. Obesity (BMI 35.0-39.9 without comorbidity)  5. Encounter  for surveillance of contraceptive pills - D/C Loestrin Rx: - Norgestimate-Ethinyl Estradiol Triphasic 0.18/0.215/0.25 MG-35 MCG tablet; Take 1 tablet by mouth daily.  Dispense: 28 tablet; Refill: 11    Plan   Follow up prn   Meds ordered this encounter  Medications  . Norgestimate-Ethinyl Estradiol Triphasic 0.18/0.215/0.25 MG-35 MCG tablet    Sig: Take 1 tablet by mouth daily.    Dispense:  28 tablet    Refill:  11    Need to obtain previous records  Brock Bad, MD 09/20/2020 10:12 AM

## 2020-09-20 NOTE — Progress Notes (Signed)
Pt wants to change [facility], she has been bleeding for most of September.  LMP: 9/5 then had bleeding again on 9/17 and again on 9/24.    Pt would also like to have vaginal check - has become recently sexually active.

## 2020-09-21 ENCOUNTER — Other Ambulatory Visit: Payer: Self-pay | Admitting: Obstetrics

## 2020-09-21 ENCOUNTER — Other Ambulatory Visit: Payer: Self-pay

## 2020-09-21 DIAGNOSIS — B9689 Other specified bacterial agents as the cause of diseases classified elsewhere: Secondary | ICD-10-CM

## 2020-09-21 DIAGNOSIS — N760 Acute vaginitis: Secondary | ICD-10-CM

## 2020-09-21 LAB — CERVICOVAGINAL ANCILLARY ONLY
Bacterial Vaginitis (gardnerella): POSITIVE — AB
Candida Glabrata: NEGATIVE
Candida Vaginitis: NEGATIVE
Chlamydia: NEGATIVE
Comment: NEGATIVE
Comment: NEGATIVE
Comment: NEGATIVE
Comment: NEGATIVE
Comment: NEGATIVE
Comment: NORMAL
Neisseria Gonorrhea: NEGATIVE
Trichomonas: NEGATIVE

## 2020-09-21 MED ORDER — METRONIDAZOLE 500 MG PO TABS: 500.0000 mg | ORAL_TABLET | Freq: Two times a day (BID) | ORAL | 2 refills | Status: DC

## 2020-09-21 NOTE — Progress Notes (Signed)
Pt requests Flagyl to be sent to a different [facility]. Flagyl sent to [facility] on [address].

## 2020-09-23 DIAGNOSIS — M25612 Stiffness of left shoulder, not elsewhere classified: Secondary | ICD-10-CM | POA: Diagnosis not present

## 2020-09-23 DIAGNOSIS — M542 Cervicalgia: Secondary | ICD-10-CM | POA: Diagnosis not present

## 2020-09-27 DIAGNOSIS — M542 Cervicalgia: Secondary | ICD-10-CM | POA: Diagnosis not present

## 2020-09-27 DIAGNOSIS — M25612 Stiffness of left shoulder, not elsewhere classified: Secondary | ICD-10-CM | POA: Diagnosis not present

## 2020-10-04 ENCOUNTER — Encounter: Payer: Self-pay | Admitting: Family Medicine

## 2020-10-04 DIAGNOSIS — M25612 Stiffness of left shoulder, not elsewhere classified: Secondary | ICD-10-CM | POA: Diagnosis not present

## 2020-10-04 DIAGNOSIS — M542 Cervicalgia: Secondary | ICD-10-CM | POA: Diagnosis not present

## 2020-10-04 NOTE — Telephone Encounter (Signed)
Yes, pls to letter

## 2020-10-06 DIAGNOSIS — M542 Cervicalgia: Secondary | ICD-10-CM | POA: Diagnosis not present

## 2020-10-06 DIAGNOSIS — M25612 Stiffness of left shoulder, not elsewhere classified: Secondary | ICD-10-CM | POA: Diagnosis not present

## 2020-10-18 DIAGNOSIS — M25612 Stiffness of left shoulder, not elsewhere classified: Secondary | ICD-10-CM | POA: Diagnosis not present

## 2020-10-18 DIAGNOSIS — M542 Cervicalgia: Secondary | ICD-10-CM | POA: Diagnosis not present

## 2020-10-21 ENCOUNTER — Other Ambulatory Visit: Payer: Self-pay

## 2020-10-21 DIAGNOSIS — M542 Cervicalgia: Secondary | ICD-10-CM | POA: Diagnosis not present

## 2020-10-21 DIAGNOSIS — M25612 Stiffness of left shoulder, not elsewhere classified: Secondary | ICD-10-CM | POA: Diagnosis not present

## 2020-10-21 MED ORDER — FLUOXETINE HCL 20 MG PO CAPS
20.0000 mg | ORAL_CAPSULE | Freq: Every day | ORAL | 1 refills | Status: DC
Start: 2020-10-21 — End: 2021-03-01

## 2020-10-21 NOTE — Telephone Encounter (Signed)
OK, chart reviewed. Fluoxetine eRx'd. Needs f/u regarding anxiety and this med in 6 wks.

## 2020-10-21 NOTE — Telephone Encounter (Signed)
Patient is requesting refill for Fluoxetine 20mg  capsule but is no longer on medication list. Last seen on 09/15/20.  Please advise if refill still appropriate, thanks. Medication pending

## 2020-10-21 NOTE — Telephone Encounter (Signed)
Sent MyChart message and attempted to call

## 2020-11-29 ENCOUNTER — Other Ambulatory Visit: Payer: Self-pay | Admitting: Family Medicine

## 2021-01-15 ENCOUNTER — Other Ambulatory Visit: Payer: Self-pay | Admitting: Family Medicine

## 2021-01-20 ENCOUNTER — Other Ambulatory Visit: Payer: Self-pay

## 2021-01-23 ENCOUNTER — Ambulatory Visit (INDEPENDENT_AMBULATORY_CARE_PROVIDER_SITE_OTHER): Payer: Commercial Managed Care - PPO | Admitting: Family Medicine

## 2021-01-23 ENCOUNTER — Encounter: Payer: Self-pay | Admitting: Family Medicine

## 2021-01-23 ENCOUNTER — Other Ambulatory Visit: Payer: Self-pay

## 2021-01-23 VITALS — BP 112/73 | HR 63 | Temp 97.9°F | Resp 16 | Ht 65.0 in | Wt 236.4 lb

## 2021-01-23 DIAGNOSIS — F419 Anxiety disorder, unspecified: Secondary | ICD-10-CM

## 2021-01-23 DIAGNOSIS — F32A Depression, unspecified: Secondary | ICD-10-CM

## 2021-01-23 DIAGNOSIS — Z0000 Encounter for general adult medical examination without abnormal findings: Secondary | ICD-10-CM | POA: Diagnosis not present

## 2021-01-23 NOTE — Patient Instructions (Signed)
Health Maintenance, Female Adopting a healthy lifestyle and getting preventive care are important in promoting health and wellness. Ask your health care provider about:  The right schedule for you to have regular tests and exams.  Things you can do on your own to prevent diseases and keep yourself healthy. What should I know about diet, weight, and exercise? Eat a healthy diet  Eat a diet that includes plenty of vegetables, fruits, low-fat dairy products, and lean protein.  Do not eat a lot of foods that are high in solid fats, added sugars, or sodium.   Maintain a healthy weight Body mass index (BMI) is used to identify weight problems. It estimates body fat based on height and weight. Your health care provider can help determine your BMI and help you achieve or maintain a healthy weight. Get regular exercise Get regular exercise. This is one of the most important things you can do for your health. Most adults should:  Exercise for at least 150 minutes each week. The exercise should increase your heart rate and make you sweat (moderate-intensity exercise).  Do strengthening exercises at least twice a week. This is in addition to the moderate-intensity exercise.  Spend less time sitting. Even light physical activity can be beneficial. Watch cholesterol and blood lipids Have your blood tested for lipids and cholesterol at 37 years of age, then have this test every 5 years. Have your cholesterol levels checked more often if:  Your lipid or cholesterol levels are high.  You are older than 37 years of age.  You are at high risk for heart disease. What should I know about cancer screening? Depending on your health history and family history, you may need to have cancer screening at various ages. This may include screening for:  Breast cancer.  Cervical cancer.  Colorectal cancer.  Skin cancer.  Lung cancer. What should I know about heart disease, diabetes, and high blood  pressure? Blood pressure and heart disease  High blood pressure causes heart disease and increases the risk of stroke. This is more likely to develop in people who have high blood pressure readings, are of African descent, or are overweight.  Have your blood pressure checked: ? Every 3-5 years if you are 18-39 years of age. ? Every year if you are 40 years old or older. Diabetes Have regular diabetes screenings. This checks your fasting blood sugar level. Have the screening done:  Once every three years after age 40 if you are at a normal weight and have a low risk for diabetes.  More often and at a younger age if you are overweight or have a high risk for diabetes. What should I know about preventing infection? Hepatitis B If you have a higher risk for hepatitis B, you should be screened for this virus. Talk with your health care provider to find out if you are at risk for hepatitis B infection. Hepatitis C Testing is recommended for:  Everyone born from 1945 through 1965.  Anyone with known risk factors for hepatitis C. Sexually transmitted infections (STIs)  Get screened for STIs, including gonorrhea and chlamydia, if: ? You are sexually active and are younger than 37 years of age. ? You are older than 37 years of age and your health care provider tells you that you are at risk for this type of infection. ? Your sexual activity has changed since you were last screened, and you are at increased risk for chlamydia or gonorrhea. Ask your health care provider   if you are at risk.  Ask your health care provider about whether you are at high risk for HIV. Your health care provider may recommend a prescription medicine to help prevent HIV infection. If you choose to take medicine to prevent HIV, you should first get tested for HIV. You should then be tested every 3 months for as long as you are taking the medicine. Pregnancy  If you are about to stop having your period (premenopausal) and  you may become pregnant, seek counseling before you get pregnant.  Take 400 to 800 micrograms (mcg) of folic acid every day if you become pregnant.  Ask for birth control (contraception) if you want to prevent pregnancy. Osteoporosis and menopause Osteoporosis is a disease in which the bones lose minerals and strength with aging. This can result in bone fractures. If you are 65 years old or older, or if you are at risk for osteoporosis and fractures, ask your health care provider if you should:  Be screened for bone loss.  Take a calcium or vitamin D supplement to lower your risk of fractures.  Be given hormone replacement therapy (HRT) to treat symptoms of menopause. Follow these instructions at home: Lifestyle  Do not use any products that contain nicotine or tobacco, such as cigarettes, e-cigarettes, and chewing tobacco. If you need help quitting, ask your health care provider.  Do not use street drugs.  Do not share needles.  Ask your health care provider for help if you need support or information about quitting drugs. Alcohol use  Do not drink alcohol if: ? Your health care provider tells you not to drink. ? You are pregnant, may be pregnant, or are planning to become pregnant.  If you drink alcohol: ? Limit how much you use to 0-1 drink a day. ? Limit intake if you are breastfeeding.  Be aware of how much alcohol is in your drink. In the [place] one drink equals one 12 oz bottle of beer (355 mL), one 5 oz glass of wine (148 mL), or one 1 oz glass of hard liquor (44 mL). General instructions  Schedule regular health, dental, and eye exams.  Stay current with your vaccines.  Tell your health care provider if: ? You often feel depressed. ? You have ever been abused or do not feel safe at home. Summary  Adopting a healthy lifestyle and getting preventive care are important in promoting health and wellness.  Follow your health care provider's instructions about healthy  diet, exercising, and getting tested or screened for diseases.  Follow your health care provider's instructions on monitoring your cholesterol and blood pressure. This information is not intended to replace advice given to you by your health care provider. Make sure you discuss any questions you have with your health care provider. Document Revised: 12/03/2018 Document Reviewed: 12/03/2018 Elsevier Patient Education  2021 Elsevier Inc.  

## 2021-01-23 NOTE — Progress Notes (Signed)
Office Note 01/23/2021  CC:  Chief Complaint  Patient presents with  . Annual Exam    Pt is not fasting    HPI:  Valerie Robbins is a 35 y.o. Black female who is here for annual health maintenance exam.  Fluoxetine quite helpful for chronic anxiety and depression.   Still dealing with signif stressful life circumstances. She eats emotionally.  Not exercising.   Taking loraz occasionally. PMP AWARE reviewed today: most recent rx for loraz 1mg  was filled 09/15/20, # 90, rx by me. No red flags.  Past Medical History:  Diagnosis Date  . Abnormal Pap smear   . Anxiety and depression   . Asthma    childhood  . Atypical chest pain 09/01/2016   ED visit: +chest wall TTP.  D-dimer neg, TnI neg, CXR neg, EKG nl, no hypoxia.  . Family history of congenital hearing loss    Audiology eval at [facility]NT normal.  . History of uterine fibroid   . Metrorrhagia    with pelvic pain.  Transvag+transabd pelvic u/s NORMAL 05/2014.  06/2014 Peripheral edema    bilat LL's: suspected venous insuff 06/2019; low dose prn lasix rx'd.  . Recurrent genital herpes    HSV 2    Past Surgical History:  Procedure Laterality Date  . COLPOSCOPY W/ BIOPSY / CURETTAGE     needs annual paps    Family History  Problem Relation Age of Onset  . Hypertension Mother   . Depression Mother   . Deafness Mother   . Asthma Father   . Alcohol abuse Father   . Hypertension Maternal Grandmother   . Depression Maternal Grandmother   . Diabetes Maternal Grandmother   . Anxiety disorder Maternal Grandmother   . Deafness Maternal Grandfather   . Hearing loss Sister 0       both parents, sister, grandparents were all born deaf    Social History   Socioeconomic History  . Marital status: Single    Spouse name: Not on file  . Number of children: 3  . Years of education: 58  . Highest education level: Not on file  Occupational History  . Not on file  Tobacco Use  . Smoking status: Never Smoker  . Smokeless  tobacco: Never Used  Vaping Use  . Vaping Use: Never used  Substance and Sexual Activity  . Alcohol use: Yes    Alcohol/week: 0.0 standard drinks    Comment: social   . Drug use: No  . Sexual activity: Yes    Partners: Male    Birth control/protection: None, Pill  Other Topics Concern  . Not on file  Social History Narrative   Single, 3 sons.   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   No Tob, occ alcohol, no drugs.   No exercise.   Fun: 16 out with her children.   Denies abuse and feels safe at home.    Social Determinants of Health   Financial Resource Strain: Not on file  Food Insecurity: Not on file  Transportation Needs: Not on file  Physical Activity: Not on file  Stress: Not on file  Social Connections: Not on file  Intimate Partner Violence: Not on file    Outpatient Medications Prior to Visit  Medication Sig Dispense Refill  . FLUoxetine (PROZAC) 20 MG capsule Take 1 capsule (20 mg total) by mouth daily. 30 capsule 1  . LORazepam (ATIVAN) 1 MG tablet 1-2 tabs po bid prn severe anxiety 90 tablet 2  .  Norgestimate-Ethinyl Estradiol Triphasic 0.18/0.215/0.25 MG-35 MCG tablet Take 1 tablet by mouth daily. (Patient not taking: Reported on 01/23/2021) 28 tablet 11  . traMADol (ULTRAM) 50 MG tablet 1-2 tabs po q6h prn pain (Patient not taking: No sig reported) 30 tablet 0  . metroNIDAZOLE (FLAGYL) 500 MG tablet Take 1 tablet (500 mg total) by mouth 2 (two) times daily. (Patient not taking: Reported on 01/23/2021) 14 tablet 2   No facility-administered medications prior to visit.    No Known Allergies  ROS Review of Systems  Constitutional: Negative for appetite change, chills, fatigue and fever.  HENT: Negative for congestion, dental problem, ear pain and sore throat.   Eyes: Negative for discharge, redness and visual disturbance.  Respiratory: Negative for cough, chest tightness, shortness of breath and wheezing.   Cardiovascular: Negative for chest pain,  palpitations and leg swelling.  Gastrointestinal: Negative for abdominal pain, blood in stool, diarrhea, nausea and vomiting.  Genitourinary: Negative for difficulty urinating, dysuria, flank pain, frequency, hematuria and urgency.  Musculoskeletal: Negative for arthralgias, back pain, joint swelling, myalgias and neck stiffness.  Skin: Negative for pallor and rash.  Neurological: Negative for dizziness, speech difficulty, weakness and headaches.  Hematological: Negative for adenopathy. Does not bruise/bleed easily.  Psychiatric/Behavioral: Negative for confusion and sleep disturbance. The patient is not nervous/anxious.    PE; Vitals with BMI 01/23/2021 09/20/2020 09/15/2020  Height 5\' 5"  - 5\' 5"   Weight 236 lbs 6 oz 241 lbs 240 lbs  BMI 39.34 40.1 39.94  Systolic 112 130  Diastolic 73 83 78  Pulse 63 77 70  Some encounter information is confidential and restricted. Go to Review Flowsheets activity to see all data.    Exam chaperoned by , CMA.  Gen: Alert, well appearing.  Patient is oriented to person, place, time, and situation. AFFECT: pleasant, lucid thought and speech. ENT: Ears: EACs clear, normal epithelium.  TMs with good light reflex and landmarks bilaterally.  Eyes: no injection, icteris, swelling, or exudate.  EOMI, PERRLA. Nose: no drainage or turbinate edema/swelling.  No injection or focal lesion.  Mouth: lips without lesion/swelling.  Oral mucosa pink and moist.  Dentition intact and without obvious caries or gingival swelling.  Oropharynx without erythema, exudate, or swelling.  Neck: supple/nontender.  No LAD, mass, or TM.  Carotid pulses 2+ bilaterally, without bruits. CV: RRR, no m/r/g.   LUNGS: CTA bilat, nonlabored resps, good aeration in all lung fields. ABD: soft, NT, ND, BS normal.  No hepatospenomegaly or mass.  No bruits. EXT: no clubbing, cyanosis, or edema.  Musculoskeletal: no joint swelling, erythema, warmth, or tenderness.  ROM of all  joints intact. Skin - no sores or suspicious lesions or rashes or color changes   Pertinent labs:  Lab Results  Component Value Date   TSH 2.20 11/24/2019   Lab Results  Component Value Date   WBC 6.1 09/15/2020   HGB 13.8 09/15/2020   HCT 43.0 09/15/2020   MCV 91.7 09/15/2020   PLT 405.0 (H) 09/15/2020   Lab Results  Component Value Date   CREATININE 1.06 09/15/2020   BUN 14 09/15/2020   NA 140 09/15/2020   K 4.6 09/15/2020   CL 108 09/15/2020   CO2 28 09/15/2020   Lab Results  Component Value Date   ALT 18 11/24/2019   AST 16 11/24/2019   ALKPHOS 26 (L) 11/24/2019   BILITOT 0.6 11/24/2019   Lab Results  Component Value Date   CHOL 135 11/24/2019   Lab Results  Component Value Date   HDL 32.80 (L) 11/24/2019   Lab Results  Component Value Date   LDLCALC 88 11/24/2019   Lab Results  Component Value Date   TRIG 70.0 11/24/2019   Lab Results  Component Value Date   CHOLHDL 4 11/24/2019    ASSESSMENT AND PLAN:   1) GAD + MDD: doing MUCH better on fluoxetine 20mg  qd and loraz 1mg  bid prn.  CSC updated today.  2) Health maintenance exam: Reviewed age and gender appropriate health maintenance issues (prudent diet, regular exercise, health risks of tobacco and excessive alcohol, use of seatbelts, fire alarms in home, use of sunscreen).  Also reviewed age and gender appropriate health screening as well as vaccine recommendations. Vaccines: Flu->declined.  Tdap->declined.  Covid->declined. Labs: CMET, FLP, TSH ordered--she's not fasting today. Cervical ca screening: per GYN MD. Breast ca screening: start annual mammography at age 70 yrs. Colon ca screening: average risk patient= as per latest guidelines, start screening at 31 yrs of age.  An After Visit Summary was printed and given to the patient.  FOLLOW UP:  Return in about 6 months (around 07/23/2021) for routine chronic illness f/u.  Signed:  52, MD           01/23/2021

## 2021-01-27 ENCOUNTER — Ambulatory Visit (INDEPENDENT_AMBULATORY_CARE_PROVIDER_SITE_OTHER): Payer: Commercial Managed Care - PPO

## 2021-01-27 ENCOUNTER — Encounter: Payer: Self-pay | Admitting: Family Medicine

## 2021-01-27 ENCOUNTER — Other Ambulatory Visit: Payer: Self-pay

## 2021-01-27 DIAGNOSIS — N912 Amenorrhea, unspecified: Secondary | ICD-10-CM | POA: Diagnosis not present

## 2021-01-27 DIAGNOSIS — R399 Unspecified symptoms and signs involving the genitourinary system: Secondary | ICD-10-CM

## 2021-01-27 DIAGNOSIS — Z0000 Encounter for general adult medical examination without abnormal findings: Secondary | ICD-10-CM

## 2021-01-27 LAB — CBC WITH DIFFERENTIAL/PLATELET
Basophils Absolute: 0 10*3/uL (ref 0.0–0.1)
Basophils Relative: 0.2 % (ref 0.0–3.0)
Eosinophils Absolute: 0.2 10*3/uL (ref 0.0–0.7)
Eosinophils Relative: 3.4 % (ref 0.0–5.0)
HCT: 42.6 % (ref 36.0–46.0)
Hemoglobin: 13.7 g/dL (ref 12.0–15.0)
Lymphocytes Relative: 45.4 % (ref 12.0–46.0)
Lymphs Abs: 2.3 10*3/uL (ref 0.7–4.0)
MCHC: 32 g/dL (ref 30.0–36.0)
MCV: 91.2 fl (ref 78.0–100.0)
Monocytes Absolute: 0.5 10*3/uL (ref 0.1–1.0)
Monocytes Relative: 9.9 % (ref 3.0–12.0)
Neutro Abs: 2.1 10*3/uL (ref 1.4–7.7)
Neutrophils Relative %: 41.1 % — ABNORMAL LOW (ref 43.0–77.0)
Platelets: 405 10*3/uL — ABNORMAL HIGH (ref 150.0–400.0)
RBC: 4.67 Mil/uL (ref 3.87–5.11)
RDW: 14.6 % (ref 11.5–15.5)
WBC: 5.1 10*3/uL (ref 4.0–10.5)

## 2021-01-27 LAB — COMPREHENSIVE METABOLIC PANEL
ALT: 23 U/L (ref 0–35)
AST: 20 U/L (ref 0–37)
Albumin: 3.9 g/dL (ref 3.5–5.2)
Alkaline Phosphatase: 32 U/L — ABNORMAL LOW (ref 39–117)
BUN: 18 mg/dL (ref 6–23)
CO2: 27 mEq/L (ref 19–32)
Calcium: 9.1 mg/dL (ref 8.4–10.5)
Chloride: 108 mEq/L (ref 96–112)
Creatinine, Ser: 0.98 mg/dL (ref 0.40–1.20)
GFR: 74.37 mL/min (ref >60.00)
Glucose, Bld: 94 mg/dL (ref 70–99)
Potassium: 4.6 mEq/L (ref 3.5–5.1)
Sodium: 139 mEq/L (ref 135–145)
Total Bilirubin: 0.3 mg/dL (ref 0.2–1.2)
Total Protein: 6.5 g/dL (ref 6.0–8.3)

## 2021-01-27 LAB — TSH: TSH: 2.8 u[IU]/mL (ref 0.35–4.50)

## 2021-01-27 LAB — LIPID PANEL
Cholesterol: 158 mg/dL (ref 0–200)
HDL: 38 mg/dL — ABNORMAL LOW (ref >39.00)
LDL Cholesterol: 107 mg/dL — ABNORMAL HIGH (ref 0–99)
NonHDL: 120.04
Total CHOL/HDL Ratio: 4
Triglycerides: 66 mg/dL (ref 0.0–149.0)
VLDL: 13.2 mg/dL (ref 0.0–40.0)

## 2021-01-27 LAB — POCT URINE PREGNANCY: Preg Test, Ur: NEGATIVE

## 2021-01-27 NOTE — Addendum Note (Signed)
Addended by: Paschal Dopp on: 01/27/2021 10:43 AM   Modules accepted: Orders

## 2021-01-27 NOTE — Addendum Note (Signed)
Addended by: Paschal Dopp on: 01/27/2021 10:58 AM   Modules accepted: Orders

## 2021-01-28 LAB — URINALYSIS, ROUTINE W REFLEX MICROSCOPIC
Bilirubin Urine: NEGATIVE
Glucose, UA: NEGATIVE
Hgb urine dipstick: NEGATIVE
Ketones, ur: NEGATIVE
Leukocytes,Ua: NEGATIVE
Nitrite: NEGATIVE
Protein, ur: NEGATIVE
Specific Gravity, Urine: 1.023 (ref 1.001–1.03)
pH: 6 (ref 5.0–8.0)

## 2021-01-28 LAB — URINE CULTURE
MICRO NUMBER:: 11497676
Result:: NO GROWTH
SPECIMEN QUALITY:: ADEQUATE

## 2021-01-30 NOTE — Telephone Encounter (Signed)
Please review results.

## 2021-02-03 ENCOUNTER — Encounter: Payer: Self-pay | Admitting: Obstetrics

## 2021-02-03 ENCOUNTER — Ambulatory Visit (INDEPENDENT_AMBULATORY_CARE_PROVIDER_SITE_OTHER): Payer: Commercial Managed Care - PPO | Admitting: Obstetrics

## 2021-02-03 ENCOUNTER — Other Ambulatory Visit: Payer: Self-pay

## 2021-02-03 ENCOUNTER — Other Ambulatory Visit (HOSPITAL_COMMUNITY)
Admission: RE | Admit: 2021-02-03 | Discharge: 2021-02-03 | Disposition: A | Discharge status: Home / Self Care | Payer: Commercial Managed Care - PPO | Source: Ambulatory Visit | Attending: Obstetrics | Admitting: Obstetrics

## 2021-02-03 VITALS — BP 127/84 | HR 72 | Ht 65.0 in | Wt 240.0 lb

## 2021-02-03 DIAGNOSIS — N898 Other specified noninflammatory disorders of vagina: Secondary | ICD-10-CM | POA: Diagnosis not present

## 2021-02-03 DIAGNOSIS — E669 Obesity, unspecified: Secondary | ICD-10-CM

## 2021-02-03 DIAGNOSIS — N939 Abnormal uterine and vaginal bleeding, unspecified: Secondary | ICD-10-CM | POA: Diagnosis not present

## 2021-02-03 DIAGNOSIS — Z01419 Encounter for gynecological examination (general) (routine) without abnormal findings: Secondary | ICD-10-CM | POA: Diagnosis not present

## 2021-02-03 MED ORDER — PHENTERMINE HCL 37.5 MG PO TABS: 37.5000 mg | ORAL_TABLET | Freq: Every day | ORAL | 2 refills | Status: DC

## 2021-02-03 NOTE — Progress Notes (Signed)
RGYN patient presents for problem visit.  LMP:  CC: Abnormal/irregualr cycles lighter and lasting a few days days sometimes , pt notes not having to wear a pad sometimes it may be so light ,cramping and pelvic pain. Pt this has been going on x 3 months pain will be lower back.  No vaginal odor , no discharge  Pt no longer taking birth control as of last month.   Pt had pap 02/23/2020 pt requesting pap today has a new INS that will cover   Pt had Annual Exam 01/031/22. Pt had UA and labs done on  01/27/21 w/ PCP

## 2021-02-03 NOTE — Progress Notes (Signed)
Subjective:        Valerie Robbins is a 35 y.o. female here for a routine exam.  Current complaints: Intermittent pelvic pain and bloating.  Also has vaginal discharge and cycles have been shorter and lighter.  Personal health questionnaire:  Is patient Ashkenazi Jewish, have a family history of breast and/or ovarian cancer: no Is there a family history of uterine cancer diagnosed at age < 32, gastrointestinal cancer, urinary tract cancer, family member who is a Personnel officer syndrome-associated carrier: no Is the patient overweight and hypertensive, family history of diabetes, personal history of gestational diabetes, preeclampsia or PCOS: no Is patient over 36, have PCOS,  family history of premature CHD under age 52, diabetes, smoke, have hypertension or peripheral artery disease:  no At any time, has a partner hit, kicked or otherwise hurt or frightened you?: no Over the past 2 weeks, have you felt down, depressed or hopeless?: no Over the past 2 weeks, have you felt little interest or pleasure in doing things?:no   Gynecologic History Patient's last menstrual period was 01/24/2021 (exact date). Contraception: none Last Pap: 02-23-2020. Results were: normal Last mammogram: n/a. Results were: n/a  Obstetric History OB History  Gravida Para Term Preterm AB Living  5 3 3  0 2 3  SAB IAB Ectopic Multiple Live Births  1 0 1 0 3    # Outcome Date GA Lbr Len/2nd Weight Sex Delivery Anes PTL Lv  5 Term 03/02/12 [redacted]w[redacted]d 06:45 / 00:02 7 lb 8.6 oz (3.42 kg) M Vag-Vacuum EPI  LIV     Birth Comments: WNL  4 SAB           3 Ectopic           2 Term     M Vag-Spont   LIV  1 Term     M Vag-Spont  Y LIV    Past Medical History:  Diagnosis Date  . Abnormal Pap smear   . Anxiety and depression   . Asthma    childhood  . Atypical chest pain 09/01/2016   ED visit: +chest wall TTP.  D-dimer neg, TnI neg, CXR neg, EKG nl, no hypoxia.  . Family history of congenital hearing loss    Audiology  eval at [facility] ENT normal.  . History of uterine fibroid   . Metrorrhagia    with pelvic pain.  Transvag+transabd pelvic u/s NORMAL 05/2014.  06/2014 Peripheral edema    bilat LL's: suspected venous insuff 06/2019; low dose prn lasix rx'd.  . Recurrent genital herpes    HSV 2    Past Surgical History:  Procedure Laterality Date  . COLPOSCOPY W/ BIOPSY / CURETTAGE     needs annual paps     Current Outpatient Medications:  .  FLUoxetine (PROZAC) 20 MG capsule, Take 1 capsule (20 mg total) by mouth daily., Disp: 30 capsule, Rfl: 1 .  LORazepam (ATIVAN) 1 MG tablet, 1-2 tabs po bid prn severe anxiety (Patient not taking: Reported on 02/03/2021), Disp: 90 tablet, Rfl: 2 .  Norgestimate-Ethinyl Estradiol Triphasic 0.18/0.215/0.25 MG-35 MCG tablet, Take 1 tablet by mouth daily. (Patient not taking: No sig reported), Disp: 28 tablet, Rfl: 11 .  phentermine (ADIPEX-P) 37.5 MG tablet, Take 1 tablet (37.5 mg total) by mouth daily before breakfast., Disp: 30 tablet, Rfl: 2 .  traMADol (ULTRAM) 50 MG tablet, 1-2 tabs po q6h prn pain (Patient not taking: No sig reported), Disp: 30 tablet, Rfl: 0 No Known Allergies  Social History  Tobacco Use  . Smoking status: Never Smoker  . Smokeless tobacco: Never Used  Substance Use Topics  . Alcohol use: Yes    Alcohol/week: 0.0 standard drinks    Comment: social     Family History  Problem Relation Age of Onset  . Hypertension Mother   . Depression Mother   . Deafness Mother   . Asthma Father   . Alcohol abuse Father   . Hypertension Maternal Grandmother   . Depression Maternal Grandmother   . Diabetes Maternal Grandmother   . Anxiety disorder Maternal Grandmother   . Deafness Maternal Grandfather   . Hearing loss Sister 0       both parents, sister, grandparents were all born deaf      Review of Systems  Constitutional: negative for fatigue and weight loss Respiratory: negative for cough and wheezing Cardiovascular: negative for chest pain,  fatigue and palpitations Gastrointestinal: negative for abdominal pain and change in bowel habits Musculoskeletal:negative for myalgias Neurological: negative for gait problems and tremors Behavioral/Psych: negative for abusive relationship, depression Endocrine: negative for temperature intolerance    Genitourinary: positive for abnormal menstrual periods, pelvic pain, vaginal discharge and bloating. negative for genital lesions, hot flashes, sexual problems  Integument/breast: negative for breast lump, breast tenderness, nipple discharge and skin lesion(s)    Objective:       BP 127/84   Pulse 72   Ht 5\' 5"  (1.651 m)   Wt 240 lb (108.9 kg)   LMP 01/24/2021 (Exact Date)   BMI 39.94 kg/m  General:   alert and no distress  Skin:   no rash or abnormalities  Lungs:   clear to auscultation bilaterally  Heart:   regular rate and rhythm, S1, S2 normal, no murmur, click, rub or gallop  Breasts:   normal without suspicious masses, skin or nipple changes or axillary nodes  Abdomen:  normal findings: no organomegaly, soft, non-tender and no hernia  Pelvis:  External genitalia: normal general appearance Urinary system: urethral meatus normal and bladder without fullness, nontender Vaginal: normal without tenderness, induration or masses Cervix: normal appearance Adnexa: normal bimanual exam Uterus: anteverted and non-tender, normal size   Lab Review Urine pregnancy test Labs reviewed yes Radiologic studies reviewed yes  50% of 20 min visit spent on counseling and coordination of care.   Assessment:     1. Encounter for routine gynecological examination with Papanicolaou smear of cervix Rx: - Cytology - PAP( [facility])  2. Abnormal uterine bleeding (AUB), Pelvic Pain, Bloating Rx: - 03/24/2021 PELVIC COMPLETE WITH TRANSVAGINAL; Future  3. Vaginal discharge Rx: - Cervicovaginal ancillary only( )  4. Obesity (BMI 35.0-39.9 without comorbidity) Rx: - phentermine  (ADIPEX-P) 37.5 MG tablet; Take 1 tablet (37.5 mg total) by mouth daily before breakfast.  Dispense: 30 tablet; Refill: 2    Plan:    Education reviewed: calcium supplements, depression evaluation, low fat, low cholesterol diet, safe sex/STD prevention, self breast exams and weight bearing exercise. Contraception: OCP (estrogen/progesterone). Follow up in: 2 weeks.   Meds ordered this encounter  Medications  . phentermine (ADIPEX-P) 37.5 MG tablet    Sig: Take 1 tablet (37.5 mg total) by mouth daily before breakfast.    Dispense:  30 tablet    Refill:  2   Orders Placed This Encounter  Procedures  . 11-23-1990 PELVIC COMPLETE WITH TRANSVAGINAL    Standing Status:   Future    Standing Expiration Date:   02/03/2022    Order Specific Question:  Reason for Exam (SYMPTOM  OR DIAGNOSIS REQUIRED)    Answer:   AUB    Order Specific Question:   Preferred imaging location?    Answer:   [facility]    Brock Bad, MD 02/03/2021 10:43 AM

## 2021-02-06 LAB — CERVICOVAGINAL ANCILLARY ONLY
Bacterial Vaginitis (gardnerella): POSITIVE — AB
Candida Glabrata: NEGATIVE
Candida Vaginitis: NEGATIVE
Chlamydia: NEGATIVE
Comment: NEGATIVE
Comment: NEGATIVE
Comment: NEGATIVE
Comment: NEGATIVE
Comment: NEGATIVE
Comment: NORMAL
Neisseria Gonorrhea: NEGATIVE
Trichomonas: NEGATIVE

## 2021-02-07 ENCOUNTER — Other Ambulatory Visit: Payer: Self-pay | Admitting: Obstetrics

## 2021-02-07 DIAGNOSIS — N760 Acute vaginitis: Secondary | ICD-10-CM

## 2021-02-07 MED ORDER — METRONIDAZOLE 500 MG PO TABS: 500.0000 mg | ORAL_TABLET | Freq: Two times a day (BID) | ORAL | 2 refills | Status: DC

## 2021-02-09 ENCOUNTER — Telehealth: Payer: Self-pay

## 2021-02-09 ENCOUNTER — Other Ambulatory Visit: Payer: Self-pay | Admitting: Obstetrics

## 2021-02-09 DIAGNOSIS — N946 Dysmenorrhea, unspecified: Secondary | ICD-10-CM

## 2021-02-09 LAB — CYTOLOGY - PAP
Adequacy: ABSENT
Comment: NEGATIVE
HPV 16: NEGATIVE
HPV 18 / 45: NEGATIVE
High risk HPV: POSITIVE — AB

## 2021-02-09 MED ORDER — IBUPROFEN 800 MG PO TABS: 800.0000 mg | ORAL_TABLET | Freq: Three times a day (TID) | ORAL | 5 refills | Status: DC | PRN

## 2021-02-09 NOTE — Telephone Encounter (Signed)
Called pt about pap results, pt advised that she already s/w Dr. Clearance Coots.

## 2021-02-20 ENCOUNTER — Ambulatory Visit
Admission: RE | Admit: 2021-02-20 | Discharge: 2021-02-20 | Disposition: A | Discharge status: Home / Self Care | Payer: Commercial Managed Care - PPO | Source: Ambulatory Visit | Attending: Obstetrics | Admitting: Obstetrics

## 2021-02-20 ENCOUNTER — Other Ambulatory Visit: Payer: Self-pay

## 2021-02-20 DIAGNOSIS — N939 Abnormal uterine and vaginal bleeding, unspecified: Secondary | ICD-10-CM | POA: Diagnosis present

## 2021-02-24 ENCOUNTER — Other Ambulatory Visit (HOSPITAL_COMMUNITY)
Admission: RE | Admit: 2021-02-24 | Discharge: 2021-02-24 | Disposition: A | Discharge status: Home / Self Care | Payer: Commercial Managed Care - PPO | Source: Ambulatory Visit | Attending: Obstetrics | Admitting: Obstetrics

## 2021-02-24 ENCOUNTER — Ambulatory Visit (INDEPENDENT_AMBULATORY_CARE_PROVIDER_SITE_OTHER): Payer: Commercial Managed Care - PPO | Admitting: Obstetrics

## 2021-02-24 ENCOUNTER — Other Ambulatory Visit: Payer: Self-pay

## 2021-02-24 ENCOUNTER — Ambulatory Visit: Payer: Medicaid Other | Admitting: Obstetrics

## 2021-02-24 ENCOUNTER — Encounter: Payer: Self-pay | Admitting: Obstetrics

## 2021-02-24 VITALS — BP 155/94 | HR 70 | Ht 65.0 in | Wt 240.0 lb

## 2021-02-24 DIAGNOSIS — R87612 Low grade squamous intraepithelial lesion on cytologic smear of cervix (LGSIL): Secondary | ICD-10-CM | POA: Diagnosis not present

## 2021-02-24 NOTE — Progress Notes (Signed)
RGYN patient presents for Colpo. Abnormal pap : +HPV /LSIL     MBT:[id number]

## 2021-02-24 NOTE — Progress Notes (Signed)
Colposcopy Procedure Note  Indications: Pap smear 1 months ago showed: low-grade squamous intraepithelial neoplasia (LGSIL - encompassing HPV,mild dysplasia,CIN I). The prior pap showed no abnormalities.  Prior cervical/vaginal disease: normal exam without visible pathology. Prior cervical treatment: no treatment.  Procedure Details  The risks and benefits of the procedure and Written informed consent obtained.  A time-out was performed confirming the patient, procedure and allergy status  Speculum placed in vagina and excellent visualization of cervix achieved, cervix swabbed x 3 with acetic acid solution.  Findings: Cervix: no visible lesions, no mosaicism, no punctation and no abnormal vasculature; SCJ visualized 360 degrees without lesions, endocervical curettage performed, cervical biopsies taken at 6 and 12 o'clock, specimen labelled and sent to pathology and hemostasis achieved with silver nitrate.   Vaginal inspection: normal without visible lesions. Vulvar colposcopy: vulvar colposcopy not performed.   Physical Exam   Specimens: ECC and Cervical Biopsies  Complications: none.  Plan: Specimens labelled and sent to Pathology. Will base further treatment on Pathology findings. Treatment options discussed with patient. Post biopsy instructions given to patient. Return to discuss Pathology results in 2 weeks.   Brock Bad, MD 02/24/2021 11:04 AM

## 2021-02-27 ENCOUNTER — Telehealth (INDEPENDENT_AMBULATORY_CARE_PROVIDER_SITE_OTHER): Payer: Commercial Managed Care - PPO | Admitting: Obstetrics

## 2021-02-27 ENCOUNTER — Encounter: Payer: Self-pay | Admitting: Obstetrics

## 2021-02-27 DIAGNOSIS — N939 Abnormal uterine and vaginal bleeding, unspecified: Secondary | ICD-10-CM

## 2021-02-27 DIAGNOSIS — R102 Pelvic and perineal pain unspecified side: Secondary | ICD-10-CM

## 2021-02-27 DIAGNOSIS — R87612 Low grade squamous intraepithelial lesion on cytologic smear of cervix (LGSIL): Secondary | ICD-10-CM | POA: Diagnosis not present

## 2021-02-27 LAB — SURGICAL PATHOLOGY

## 2021-02-27 NOTE — Progress Notes (Signed)
GYN Virtual F/U on U/S results 02/20/21.

## 2021-02-27 NOTE — Progress Notes (Signed)
TELEHEALTH GYNECOLOGY VISIT ENCOUNTER NOTE  Provider location: [facility] [employer] at [place]   I connected with Valerie Robbins on 02/27/21 at  8:30 AM EST by telephone at home and verified that I am speaking with the correct person using two identifiers. Patient was unable to do MyChart audiovisual encounter due to technical difficulties, she tried several times.    I discussed the limitations, risks, security and privacy concerns of performing an evaluation and management service by telephone and the availability of in person appointments. I also discussed with the patient that there may be a patient responsible charge related to this service. The patient expressed understanding and agreed to proceed.   History:  Valerie Robbins is a 35 y.o. 1996 female being evaluated today for ultrasound results. She has a history of heavy and painful periods and intermittent pelvic pain.     Past Medical History:  Diagnosis Date   Abnormal Pap smear    Anxiety and depression    Asthma    childhood   Atypical chest pain 09/01/2016   ED visit: +chest wall TTP.  D-dimer neg, TnI neg, CXR neg, EKG nl, no hypoxia.   Family history of congenital hearing loss    Audiology eval at [facility] ENT normal.   History of uterine fibroid    Metrorrhagia    with pelvic pain.  Transvag+transabd pelvic u/s NORMAL 05/2014.   Peripheral edema    bilat LL's: suspected venous insuff 06/2019; low dose prn lasix rx'd.   Recurrent genital herpes    HSV 2   Past Surgical History:  Procedure Laterality Date   COLPOSCOPY W/ BIOPSY / CURETTAGE     needs annual paps   The following portions of the patient's history were reviewed and updated as appropriate: allergies, current medications, past family history, past medical history, past social history, past surgical history and problem list.   Health Maintenance:  ASCUS pap and positive HRHPV on 02-03-2021.  Colposcopy done - results  pending.  Review of Systems:  Pertinent items noted in HPI and remainder of comprehensive ROS otherwise negative.  Physical Exam:   General:  Alert, oriented and cooperative.   Mental Status: Normal mood and affect perceived. Normal judgment and thought content.  Physical exam deferred due to nature of the encounter  Labs and Imaging No results found for this or any previous visit (from the past 336 hour(s)). US PELVIC COMPLETE WITH TRANSVAGINAL  Result Date: 02/20/2021 CLINICAL DATA:  Abnormal uterine bleeding, intermittent pelvic pain and bloating, LMP 02/15/2021, history of uterine fibroids, negative physical exam EXAM: TRANSABDOMINAL AND TRANSVAGINAL ULTRASOUND OF PELVIS TECHNIQUE: Both transabdominal and transvaginal ultrasound examinations of the pelvis were performed. Transabdominal technique was performed for global imaging of the pelvis including uterus, ovaries, adnexal regions, and pelvic cul-de-sac. It was necessary to proceed with endovaginal exam following the transabdominal exam to visualize the adnexa. COMPARISON:  06/21/2014 FINDINGS: Uterus Measurements: 12.7 x 4.2 x 5.6 cm = volume: 157 mL. Anteverted. Normal morphology without mass Endometrium Thickness: 6 mm.  No endometrial fluid or focal abnormality Right ovary Measurements: 2.6 x 2.0 x 2.3 cm = volume: 6.3 mL. Normal morphology without mass Left ovary Measurements: 2.6 x 1.4 x 1.9 cm = volume: 3.7 mL. Normal morphology without mass Other findings No free pelvic fluid. No adnexal masses. Visualized urinary bladder unremarkable. IMPRESSION: Normal exam. Electronically Signed   By: Ulyses Southward M.D.   On: 02/20/2021 10:29      Assessment and Plan:  1. Abnormal uterine bleeding (AUB) - OCP's Rx for cycle regulation  2. Pelvic pain - clinically stable  3. Low grade squamous intraepithelial lesion on cytologic smear of cervix (LGSIL) - colposcopy done, results pending        I discussed the assessment and treatment  plan with the patient. The patient was provided an opportunity to ask questions and all were answered. The patient agreed with the plan and demonstrated an understanding of the instructions.   The patient was advised to call back or seek an in-person evaluation/go to the ED if the symptoms worsen or if the condition fails to improve as anticipated.  I provided 15 minutes of non-face-to-face time during this encounter.   Coral Ceo, MD Center for [facility], [facility] 02/27/21

## 2021-02-28 ENCOUNTER — Encounter: Payer: Self-pay | Admitting: Family Medicine

## 2021-02-28 ENCOUNTER — Other Ambulatory Visit: Payer: Self-pay | Admitting: Family Medicine

## 2021-03-01 MED ORDER — FLUOXETINE HCL 20 MG PO CAPS: 20.0000 mg | ORAL_CAPSULE | Freq: Every day | ORAL | 1 refills | Status: DC

## 2021-03-13 ENCOUNTER — Encounter: Payer: Self-pay | Admitting: Obstetrics

## 2021-03-13 ENCOUNTER — Telehealth (INDEPENDENT_AMBULATORY_CARE_PROVIDER_SITE_OTHER): Payer: Commercial Managed Care - PPO | Admitting: Obstetrics

## 2021-03-13 DIAGNOSIS — N939 Abnormal uterine and vaginal bleeding, unspecified: Secondary | ICD-10-CM

## 2021-03-13 DIAGNOSIS — N760 Acute vaginitis: Secondary | ICD-10-CM | POA: Diagnosis not present

## 2021-03-13 DIAGNOSIS — B9689 Other specified bacterial agents as the cause of diseases classified elsewhere: Secondary | ICD-10-CM

## 2021-03-13 MED ORDER — METRONIDAZOLE 500 MG PO TABS: 500.0000 mg | ORAL_TABLET | Freq: Two times a day (BID) | ORAL | 2 refills | Status: DC

## 2021-03-13 NOTE — Progress Notes (Signed)
Patient presents for My chart follow up for ocp for cycle regulation. Patient state that she has not started taking ocp yet due to not starting her cycle yet. She request a refill for Flagyl. No other concerns.

## 2021-03-13 NOTE — Progress Notes (Signed)
GYNECOLOGY VIRTUAL VISIT ENCOUNTER NOTE  Provider location: [facility]   Patient location: Home  I connected with Valerie Robbins on 03/13/21 at  8:45 AM EDT by MyChart Video Encounter and verified that I am speaking with the correct person using two identifiers.   I discussed the limitations, risks, security and privacy concerns of performing an evaluation and management service virtually and the availability of in person appointments. I also discussed with the patient that there may be a patient responsible charge related to this service. The patient expressed understanding and agreed to proceed.   History:  Valerie Robbins is a 35 y.o. [phone] female being evaluated today for history of irregular periods.  OCP's recommended for cycle regulation.  She requested further discussion of starting OCP's. She denies any abnormal vaginal discharge, pelvic pain or other concerns.       Past Medical History:  Diagnosis Date  . Abnormal Pap smear   . Anxiety and depression   . Asthma    childhood  . Atypical chest pain 09/01/2016   ED visit: +chest wall TTP.  D-dimer neg, TnI neg, CXR neg, EKG nl, no hypoxia.  . Family history of congenital hearing loss    Audiology eval at [facility] ENT normal.  . History of uterine fibroid   . Metrorrhagia    with pelvic pain.  Transvag+transabd pelvic u/s NORMAL 05/2014.  Marland Kitchen Peripheral edema    bilat LL's: suspected venous insuff 06/2019; low dose prn lasix rx'd.  . Recurrent genital herpes    HSV 2   Past Surgical History:  Procedure Laterality Date  . COLPOSCOPY W/ BIOPSY / CURETTAGE     needs annual paps   The following portions of the patient's history were reviewed and updated as appropriate: allergies, current medications, past family history, past medical history, past social history, past surgical history and problem list.   Health Maintenance:  LGSIL pap and positive HRHPV on 03-03-2021.  Colposcopic  biopsies also revealed LGSIL.  Review of Systems:  Pertinent items noted in HPI and remainder of comprehensive ROS otherwise negative.  Physical Exam:   General:  Alert, oriented and cooperative. Patient appears to be in no acute distress.  Mental Status: Normal mood and affect. Normal behavior. Normal judgment and thought content.   Respiratory: Normal respiratory effort, no problems with respiration noted  Rest of physical exam deferred due to type of encounter  Labs and Imaging No results found for this or any previous visit (from the past 336 hour(s)). US PELVIC COMPLETE WITH TRANSVAGINAL  Result Date: 02/20/2021 CLINICAL DATA:  Abnormal uterine bleeding, intermittent pelvic pain and bloating, LMP 02/15/2021, history of uterine fibroids, negative physical exam EXAM: TRANSABDOMINAL AND TRANSVAGINAL ULTRASOUND OF PELVIS TECHNIQUE: Both transabdominal and transvaginal ultrasound examinations of the pelvis were performed. Transabdominal technique was performed for global imaging of the pelvis including uterus, ovaries, adnexal regions, and pelvic cul-de-sac. It was necessary to proceed with endovaginal exam following the transabdominal exam to visualize the adnexa. COMPARISON:  06/21/2014 FINDINGS: Uterus Measurements: 12.7 x 4.2 x 5.6 cm = volume: 157 mL. Anteverted. Normal morphology without mass Endometrium Thickness: 6 mm.  No endometrial fluid or focal abnormality Right ovary Measurements: 2.6 x 2.0 x 2.3 cm = volume: 6.3 mL. Normal morphology without mass Left ovary Measurements: 2.6 x 1.4 x 1.9 cm = volume: 3.7 mL. Normal morphology without mass Other findings No free pelvic fluid. No adnexal masses. Visualized urinary bladder unremarkable. IMPRESSION: Normal  exam. Electronically Signed   By: Ulyses Southward M.D.   On: 02/20/2021 10:29       Assessment and Plan:     1. Abnormal uterine bleeding (AUB) - OCP's Rx  2. BV (bacterial vaginosis) Rx: - metroNIDAZOLE (FLAGYL) 500 MG tablet; Take  1 tablet (500 mg total) by mouth 2 (two) times daily.  Dispense: 14 tablet; Refill: 2       I discussed the assessment and treatment plan with the patient. The patient was provided an opportunity to ask questions and all were answered. The patient agreed with the plan and demonstrated an understanding of the instructions.   The patient was advised to call back or seek an in-person evaluation/go to the ED if the symptoms worsen or if the condition fails to improve as anticipated.  I provided 15 minutes of face-to-face time during this encounter.   Coral Ceo, MD Center for [facility], [facility] 03/13/21

## 2021-05-15 ENCOUNTER — Other Ambulatory Visit: Payer: Self-pay

## 2021-05-15 ENCOUNTER — Encounter: Payer: Commercial Managed Care - PPO | Admitting: Family Medicine

## 2021-05-15 ENCOUNTER — Encounter: Payer: Self-pay | Admitting: Family Medicine

## 2021-05-15 NOTE — Progress Notes (Signed)
Virtual Visit via Video Note  I connected with pt on 05/15/21 at  1:30 PM EDT by a video enabled telemedicine application and verified that I am speaking with the correct person using two identifiers.  Location patient: home, [place] Location provider:work or home office Persons participating in the virtual visit: patient, provider  I discussed the limitations of evaluation and management by telemedicine and the availability of in person appointments. The patient expressed understanding and agreed to proceed.   HPI: Pt is 35 y/o AAF being seen today for anxiety and depression. I last saw her about 4 mo ago. A/P as of that visit: "1) GAD + MDD: doing MUCH better on fluoxetine 20mg  qd and loraz 1mg  bid prn.  CSC updated today.   2) Health maintenance exam: Reviewed age and gender appropriate health maintenance issues (prudent diet, regular exercise, health risks of tobacco and excessive alcohol, use of seatbelts, fire alarms in home, use of sunscreen).  Also reviewed age and gender appropriate health screening as well as vaccine recommendations. Vaccines: Flu->declined.  Tdap->declined.  Covid->declined. Labs: CMET, FLP, TSH ordered--she's not fasting today. Cervical ca screening: per GYN MD. Breast ca screening: start annual mammography at age 22 yrs. Colon ca screening: average risk patient= as per latest guidelines, start screening at 2 yrs of age."  INTERIM HX:   I started her on fluoxetine 20mg  March of 2021 (she had been on citalpram prior to that) and she has been on lorazepam 1mg  qd-bid for quite a while.  ROS: See pertinent positives and negatives per HPI.  Past Medical History:  Diagnosis Date   Abnormal Pap smear    Anxiety and depression    Asthma    childhood   Atypical chest pain 09/01/2016   ED visit: +chest wall TTP.  D-dimer neg, TnI neg, CXR neg, EKG nl, no hypoxia.   Family history of congenital hearing loss    Audiology eval at [facility] ENT normal.   History of  uterine fibroid    Metrorrhagia    with pelvic pain.  Transvag+transabd pelvic u/s NORMAL 05/2014.   Peripheral edema    bilat LL's: suspected venous insuff 06/2019; low dose prn lasix rx'd.   Recurrent genital herpes    HSV 2    Past Surgical History:  Procedure Laterality Date   COLPOSCOPY W/ BIOPSY / CURETTAGE     needs annual paps     Current Outpatient Medications:    FLUoxetine (PROZAC) 20 MG capsule, Take 1 capsule (20 mg total) by mouth daily., Disp: 90 capsule, Rfl: 1   ibuprofen (ADVIL) 800 MG tablet, Take 1 tablet (800 mg total) by mouth every 8 (eight) hours as needed., Disp: 30 tablet, Rfl: 5   LORazepam (ATIVAN) 1 MG tablet, 1-2 tabs po bid prn severe anxiety, Disp: 90 tablet, Rfl: 2  EXAM:  VITALS per patient if applicable:  Vitals with BMI 02/24/2021 02/03/2021 01/23/2021  Height 5\' 5"  5\' 5"  5\' 5"   Weight 240 lbs 240 lbs 236 lbs 6 oz  BMI 39.94 39.94 39.34  Systolic 155 127 07/2019  Diastolic 94 84 73  Pulse 70 72 63  Some encounter information is confidential and restricted. Go to Review Flowsheets activity to see all data.     GENERAL: alert, oriented, appears well and in no acute distress  HEENT: atraumatic, conjunttiva clear, no obvious abnormalities on inspection of external nose and ears  NECK: normal movements of the head and neck  LUNGS: on inspection no signs of respiratory distress,  breathing rate appears normal, no obvious gross SOB, gasping or wheezing  CV: no obvious cyanosis  MS: moves all visible extremities without noticeable abnormality  PSYCH/NEURO: pleasant and cooperative, no obvious depression or anxiety, speech and thought processing grossly intact  LABS: none today    Chemistry      Component Value Date/Time   NA 139 01/27/2021 0827   K 4.6 01/27/2021 0827   CL 108 01/27/2021 0827   CO2 27 01/27/2021 0827   BUN 18 01/27/2021 0827   CREATININE 0.98 01/27/2021 0827      Component Value Date/Time   CALCIUM 9.1 01/27/2021 0827    ALKPHOS 32 (L) 01/27/2021 0827   AST 20 01/27/2021 0827   ALT 23 01/27/2021 0827   BILITOT 0.3 01/27/2021 0827      ASSESSMENT AND PLAN:  Discussed the following assessment and plan:  No diagnosis found.   I discussed the assessment and treatment plan with the patient. The patient was provided an opportunity to ask questions and all were answered. The patient agreed with the plan and demonstrated an understanding of the instructions.   F/u:   Signed:  Santiago Bumpers, MD           05/15/2021

## 2021-06-19 ENCOUNTER — Encounter: Payer: Self-pay | Admitting: Family Medicine

## 2021-06-19 MED ORDER — LORAZEPAM 1 MG PO TABS
ORAL_TABLET | ORAL | 0 refills | Status: DC
Start: 2021-06-19 — End: 2021-09-18

## 2021-06-19 NOTE — Telephone Encounter (Signed)
Requesting: lorazepam Contract: 01/23/21 UDS: n/a Last Visit: 05/15/21(unsuccessful VV),01/23/21 Next Visit: advised to f/u July Last Refill: 09/15/20(90,2)  Please Advise. Medication pending

## 2021-07-11 ENCOUNTER — Telehealth: Payer: Self-pay | Admitting: Family Medicine

## 2021-07-11 NOTE — Telephone Encounter (Signed)
Patient states Dr. Milinda Cave agreed to take her son as a new patient. Please advise if ok to schedule. Thank you.

## 2021-07-12 NOTE — Telephone Encounter (Signed)
Yes okay 

## 2021-07-12 NOTE — Telephone Encounter (Signed)
Please advise 

## 2021-08-22 ENCOUNTER — Other Ambulatory Visit: Payer: Self-pay | Admitting: *Deleted

## 2021-08-22 DIAGNOSIS — N898 Other specified noninflammatory disorders of vagina: Secondary | ICD-10-CM

## 2021-08-22 MED ORDER — METRONIDAZOLE 500 MG PO TABS: 500.0000 mg | ORAL_TABLET | Freq: Two times a day (BID) | ORAL | 0 refills | Status: DC

## 2021-08-22 NOTE — Progress Notes (Signed)
Patient called reporting symptoms of BV, with history of the same. RX for Flagyl sent in per protocol. Pt informed that she would need to come in to the office for any recurrence of symptoms.

## 2021-09-18 ENCOUNTER — Other Ambulatory Visit: Payer: Self-pay | Admitting: Family Medicine

## 2021-09-18 ENCOUNTER — Encounter: Payer: Self-pay | Admitting: Family Medicine

## 2021-09-18 DIAGNOSIS — M25562 Pain in left knee: Secondary | ICD-10-CM | POA: Insufficient documentation

## 2021-09-18 MED ORDER — LORAZEPAM 1 MG PO TABS
ORAL_TABLET | ORAL | 0 refills | Status: DC
Start: 2021-09-18 — End: 2021-12-05

## 2021-09-18 MED ORDER — FLUOXETINE HCL 20 MG PO CAPS
20.0000 mg | ORAL_CAPSULE | Freq: Every day | ORAL | 0 refills | Status: DC
Start: 2021-09-18 — End: 2021-11-13

## 2021-09-18 NOTE — Telephone Encounter (Signed)
Will do 30d supply of each. Pt due for f/u(in person)

## 2021-09-18 NOTE — Telephone Encounter (Signed)
RF request for Fluoxetine LOV: 05/15/21, unsuccessful VV Next ov: n/a Last written: 03/01/21(90,1)   Requesting: Lorazepam Contract: UDS: Last Visit:05/15/21, unsuccessful VV Next Visit: n/a Last Refill:06/19/21(60,0)  Please review and advise, med pending

## 2021-10-17 ENCOUNTER — Other Ambulatory Visit: Payer: Self-pay | Admitting: Family Medicine

## 2021-11-07 ENCOUNTER — Encounter: Payer: Self-pay | Admitting: Family Medicine

## 2021-11-07 ENCOUNTER — Other Ambulatory Visit: Payer: Self-pay

## 2021-11-07 NOTE — Telephone Encounter (Signed)
No action needed

## 2021-11-07 NOTE — Telephone Encounter (Signed)
Please advise if appt needed for rx

## 2021-11-07 NOTE — Telephone Encounter (Signed)
Virtual at 3, 4, or 4:15 tomorrow

## 2021-11-08 ENCOUNTER — Encounter: Payer: Self-pay | Admitting: Family Medicine

## 2021-11-08 ENCOUNTER — Other Ambulatory Visit: Payer: Self-pay

## 2021-11-08 ENCOUNTER — Telehealth (INDEPENDENT_AMBULATORY_CARE_PROVIDER_SITE_OTHER): Payer: Commercial Managed Care - PPO | Admitting: Family Medicine

## 2021-11-08 DIAGNOSIS — J111 Influenza due to unidentified influenza virus with other respiratory manifestations: Secondary | ICD-10-CM | POA: Diagnosis not present

## 2021-11-08 MED ORDER — OSELTAMIVIR PHOSPHATE 75 MG PO CAPS: ORAL_CAPSULE | ORAL | 0 refills | Status: DC

## 2021-11-08 NOTE — Progress Notes (Signed)
Virtual Visit via Video Note  I connected with Valerie Robbins on 11/08/21 at  4:00 PM EST by a video enabled telemedicine application and verified that I am speaking with the correct person using two identifiers.  Location patient: home, [place] Location provider:work or home office Persons participating in the virtual visit: patient, provider  I discussed the limitations of evaluation and management by telemedicine and the availability of in person appointments. The patient expressed understanding and agreed to proceed.   HPI: 35 y/o female being seen today for respiratory symptoms. Onset 2 days ago of cough.  This progressed over 24 hours to involve nasal congestion/runny nose, subjective fever, fatigue, headache, some body aches.  Stomach hurts some as well.  No nausea vomiting or diarrhea. No shortness of breath or chest pain. She has been taking care of her son for the last week--he tested positive for flu last week. Patient has not had a flu vaccine this year.   PMP AWARE reviewed today: most recent rx for lorazepam was filled 09/18/21, # 60, rx by me. No red flags.  ROS: See pertinent positives and negatives per HPI.  Past Medical History:  Diagnosis Date   Abnormal Pap smear    Anxiety and depression    Asthma    childhood   Atypical chest pain 09/01/2016   ED visit: +chest wall TTP.  D-dimer neg, TnI neg, CXR neg, EKG nl, no hypoxia.   Family history of congenital hearing loss    Audiology eval at [facility] ENT normal.   History of uterine fibroid    Metrorrhagia    with pelvic pain.  Transvag+transabd pelvic u/s NORMAL 05/2014.   Peripheral edema    bilat LL's: suspected venous insuff 06/2019; low dose prn lasix rx'd.   Recurrent genital herpes    HSV 2    Past Surgical History:  Procedure Laterality Date   COLPOSCOPY W/ BIOPSY / CURETTAGE     needs annual paps     Current Outpatient Medications:    FLUoxetine (PROZAC) 20 MG capsule, Take 1 capsule (20 mg total) by mouth  daily., Disp: 30 capsule, Rfl: 0   ibuprofen (ADVIL) 800 MG tablet, Take 1 tablet (800 mg total) by mouth every 8 (eight) hours as needed., Disp: 30 tablet, Rfl: 5   LORazepam (ATIVAN) 1 MG tablet, 1-2 tabs po bid prn severe anxiety, Disp: 60 tablet, Rfl: 0   metroNIDAZOLE (FLAGYL) 500 MG tablet, Take 1 tablet (500 mg total) by mouth 2 (two) times daily., Disp: 14 tablet, Rfl: 0  EXAM:  VITALS per patient if applicable:  Vitals with BMI 02/24/2021 02/03/2021 01/23/2021  Height 5\' 5"  5\' 5"  5\' 5"   Weight 240 lbs 240 lbs 236 lbs 6 oz  BMI 39.94 39.94 39.34  Systolic 155 127  Diastolic 94 84 73  Pulse 70 72 63  Some encounter information is confidential and restricted. Go to Review Flowsheets activity to see all data.    GENERAL: alert, oriented, appears well and in no acute distress  HEENT: atraumatic, conjunttiva clear, no obvious abnormalities on inspection of external nose and ears  NECK: normal movements of the head and neck  LUNGS: on inspection no signs of respiratory distress, breathing rate appears normal, no obvious gross SOB, gasping or wheezing  CV: no obvious cyanosis  MS: moves all visible extremities without noticeable abnormality  PSYCH/NEURO: pleasant and cooperative, no obvious depression or anxiety, speech and thought processing grossly intact  LABS: none today    Chemistry  Component Value Date/Time   NA 139 01/27/2021 0827   K 4.6 01/27/2021 0827   CL 108 01/27/2021 0827   CO2 27 01/27/2021 0827   BUN 18 01/27/2021 0827   CREATININE 0.98 01/27/2021 0827      Component Value Date/Time   CALCIUM 9.1 01/27/2021 0827   ALKPHOS 32 (L) 01/27/2021 0827   AST 20 01/27/2021 0827   ALT 23 01/27/2021 0827   BILITOT 0.3 01/27/2021 0827     ASSESSMENT AND PLAN:  Discussed the following assessment and plan:  Influenza-like illness.  Exposure to flu positive individual. Influenza prevalence high in the community at this time. Tamiflu 75 mg twice a day  x5 days prescribed today. Discussed the importance of rest and hydration.  Recommended saline nasal spray and ibuprofen 600 mg every 6 hours for symptom management.  I discussed the assessment and treatment plan with the patient. The patient was provided an opportunity to ask questions and all were answered. The patient agreed with the plan and demonstrated an understanding of the instructions.   F/u: if not improving signif in 4-5d  Signed:  Santiago Bumpers, MD           11/08/2021

## 2021-11-08 NOTE — Telephone Encounter (Signed)
No action needed

## 2021-11-12 ENCOUNTER — Other Ambulatory Visit: Payer: Self-pay | Admitting: Family Medicine

## 2021-12-05 ENCOUNTER — Other Ambulatory Visit: Payer: Self-pay | Admitting: Family Medicine

## 2021-12-05 ENCOUNTER — Other Ambulatory Visit: Payer: Self-pay | Admitting: Obstetrics

## 2021-12-05 DIAGNOSIS — N946 Dysmenorrhea, unspecified: Secondary | ICD-10-CM

## 2021-12-06 ENCOUNTER — Other Ambulatory Visit: Payer: Self-pay | Admitting: Obstetrics

## 2021-12-06 MED ORDER — FLUOXETINE HCL 20 MG PO CAPS: 20.0000 mg | ORAL_CAPSULE | Freq: Every day | ORAL | 3 refills | Status: DC

## 2021-12-06 MED ORDER — LORAZEPAM 1 MG PO TABS: ORAL_TABLET | ORAL | 0 refills | Status: DC

## 2021-12-06 NOTE — Telephone Encounter (Signed)
Requesting: lorazepam Contract: 01/23/21 UDS: n/a Last Visit: 11/08/21, acute Next Visit: n/a Last Refill: 09/18/21(60,0)   RF request for fluoxetine LOV:11/08/21, acute Next ov: n/a Last written: 11/13/21(30,0)  Please Advise. Medications pending

## 2021-12-06 NOTE — Telephone Encounter (Signed)
Duplicate request. Rx sent to provider for approval

## 2022-02-05 ENCOUNTER — Other Ambulatory Visit: Payer: Self-pay

## 2022-02-06 ENCOUNTER — Telehealth: Payer: Self-pay | Admitting: Family Medicine

## 2022-02-06 ENCOUNTER — Ambulatory Visit: Payer: Medicaid Other | Admitting: Family Medicine

## 2022-02-06 NOTE — Telephone Encounter (Signed)
Late cancel ... not feeling well .

## 2022-02-06 NOTE — Progress Notes (Unsigned)
OFFICE VISIT  02/06/2022  CC: No chief complaint on file.   CC: Patient is a 36 y.o. female who presents for "numbness and tingling in both legs".  HPI: ***  (She has never had neuroimaging or nerve conduction studies)  Past Medical History:  Diagnosis Date   Abnormal Pap smear    Anxiety and depression    Asthma    childhood   Atypical chest pain 09/01/2016   ED visit: +chest wall TTP.  D-dimer neg, TnI neg, CXR neg, EKG nl, no hypoxia.   Family history of congenital hearing loss    Audiology eval at [facility] ENT normal.   History of uterine fibroid    Metrorrhagia    with pelvic pain.  Transvag+transabd pelvic u/s NORMAL 05/2014.   Peripheral edema    bilat LL's: suspected venous insuff 06/2019; low dose prn lasix rx'd.   Recurrent genital herpes    HSV 2    Past Surgical History:  Procedure Laterality Date   COLPOSCOPY W/ BIOPSY / CURETTAGE     needs annual paps    Outpatient Medications Prior to Visit  Medication Sig Dispense Refill   FLUoxetine (PROZAC) 20 MG capsule Take 1 capsule (20 mg total) by mouth daily. IN OFFICE VISIT NEEDED FOR FURTHER REFILLS 90 capsule 3   ibuprofen (ADVIL) 800 MG tablet TAKE 1 TABLET(800 MG) BY MOUTH EVERY 8 HOURS AS NEEDED 30 tablet 5   LORazepam (ATIVAN) 1 MG tablet 1-2 tabs po bid prn severe anxiety 60 tablet 0   metroNIDAZOLE (FLAGYL) 500 MG tablet Take 1 tablet (500 mg total) by mouth 2 (two) times daily. (Patient not taking: Reported on 11/08/2021) 14 tablet 0   oseltamivir (TAMIFLU) 75 MG capsule 1 cap po bid x 5d 10 capsule 0   No facility-administered medications prior to visit.    No Known Allergies  ROS As per HPI  PE: Vitals with BMI 02/24/2021 02/03/2021 01/23/2021  Height 5\' 5"  5\' 5"  5\' 5"   Weight 240 lbs 240 lbs 236 lbs 6 oz  BMI 39.94 39.94 39.34  Systolic 155 127  Diastolic 94 84 73  Pulse 70 72 63  Some encounter information is confidential and restricted. Go to Review Flowsheets activity to see all data.      Physical Exam  ***  LABS:  Last CBC Lab Results  Component Value Date   WBC 5.1 01/27/2021   HGB 13.7 01/27/2021   HCT 42.6 01/27/2021   MCV 91.2 01/27/2021   MCH 29.5 09/01/2016   RDW 14.6 01/27/2021   PLT 405.0 (H) 01/27/2021   Last metabolic panel Lab Results  Component Value Date   GLUCOSE 94 01/27/2021   NA 139 01/27/2021   K 4.6 01/27/2021   CL 108 01/27/2021   CO2 27 01/27/2021   BUN 18 01/27/2021   CREATININE 0.98 01/27/2021   GFRNONAA >60 09/01/2016   CALCIUM 9.1 01/27/2021   PROT 6.5 01/27/2021   ALBUMIN 3.9 01/27/2021   BILITOT 0.3 01/27/2021   ALKPHOS 32 (L) 01/27/2021   AST 20 01/27/2021   ALT 23 01/27/2021   ANIONGAP 6 09/01/2016   Last thyroid functions Lab Results  Component Value Date   TSH 2.80 01/27/2021   No results found for: VITAMINB12 No results found for: IRON, TIBC, FERRITIN  IMPRESSION AND PLAN:  No problem-specific Assessment & Plan notes found for this encounter.   An After Visit Summary was printed and given to the patient.  FOLLOW UP: No follow-ups on file.  Signed:  Santiago Bumpers, MD           02/06/2022

## 2022-02-07 DIAGNOSIS — Z1231 Encounter for screening mammogram for malignant neoplasm of breast: Secondary | ICD-10-CM

## 2022-02-15 ENCOUNTER — Ambulatory Visit: Payer: Medicaid Other | Admitting: Family Medicine

## 2022-03-05 ENCOUNTER — Encounter: Payer: Medicaid Other | Admitting: Family Medicine

## 2022-03-05 NOTE — Progress Notes (Deleted)
Office Note ?03/05/2022 ? ?CC: No chief complaint on file. ? ? ?HPI:  ?Patient is a 36 y.o. female who is here for annual health maintenance exam and chronic anxiety with recurrent MDD. ? ? ?PMP AWARE reviewed today: most recent rx for lorazepam was filled 12/06/21, # 60, rx by me. ?No red flags. ? ?Past Medical History:  ?Diagnosis Date  ? Abnormal Pap smear   ? Anxiety and depression   ? Asthma   ? childhood  ? Atypical chest pain 09/01/2016  ? ED visit: +chest wall TTP.  D-dimer neg, TnI neg, CXR neg, EKG nl, no hypoxia.  ? Family history of congenital hearing loss   ? Audiology eval at [facility] ENT normal.  ? History of uterine fibroid   ? Metrorrhagia   ? with pelvic pain.  Transvag+transabd pelvic u/s NORMAL 05/2014.  ? Peripheral edema   ? bilat LL's: suspected venous insuff 06/2019; low dose prn lasix rx'd.  ? 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 ? ?Past Surgical History:  ?Procedure Laterality Date  ? COLPOSCOPY W/ BIOPSY / CURETTAGE    ? needs annual paps  ? ? ?Family History  ?Problem Relation Age of Onset  ? Hypertension Mother   ? Depression Mother   ? Deafness Mother   ? Asthma Father   ? Alcohol abuse Father   ? Hypertension Maternal Grandmother   ? Depression Maternal Grandmother   ? Diabetes Maternal Grandmother   ? Anxiety disorder Maternal Grandmother   ? Deafness Maternal Grandfather   ? Hearing loss Sister 0  ?     both parents, sister, grandparents were all born deaf  ? ? ?Social History  ? ?Socioeconomic History  ? Marital status: Single  ?  Spouse name: Not on file  ? Number of children: 3  ? Years of education: 20  ? Highest education level: Not on file  ?Occupational History  ? Not on file  ?Tobacco Use  ? Smoking status: Never  ? Smokeless tobacco: Never  ?Vaping Use  ? Vaping Use: Never used  ?Substance and Sexual Activity  ? Alcohol use: Yes  ?  Alcohol/week: 0.0 standard drinks  ?  Comment: social   ? Drug use: No  ? Sexual activity: Not Currently  ?  Partners: Male  ?  Birth  control/protection: None  ?Other Topics Concern  ? Not on file  ?Social History Narrative  ? Single, 3 sons.  ? 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  ? No Tob, occ alcohol, no drugs.  ? No exercise.  ? Fun: [name] out with her children.  ? Denies abuse and feels safe at home.   ? ?Social Determinants of Health  ? ?Financial Resource Strain: Not on file  ?Food Insecurity: Not on file  ?Transportation Needs: Not on file  ?Physical Activity: Not on file  ?Stress: Not on file  ?Social Connections: Not on file  ?Intimate Partner Violence: Not on file  ? ? ?Outpatient Medications Prior to Visit  ?Medication Sig Dispense Refill  ? FLUoxetine (PROZAC) 20 MG capsule Take 1 capsule (20 mg total) by mouth daily. IN OFFICE VISIT NEEDED FOR FURTHER REFILLS 90 capsule 3  ? ibuprofen (ADVIL) 800 MG tablet TAKE 1 TABLET(800 MG) BY MOUTH EVERY 8 HOURS AS NEEDED 30 tablet 5  ? LORazepam (ATIVAN) 1 MG tablet 1-2 tabs po bid prn severe anxiety 60 tablet 0  ? metroNIDAZOLE (FLAGYL) 500 MG tablet Take 1 tablet (500 mg total)  by mouth 2 (two) times daily. (Patient not taking: Reported on 11/08/2021) 14 tablet 0  ? oseltamivir (TAMIFLU) 75 MG capsule 1 cap po bid x 5d 10 capsule 0  ? ?No facility-administered medications prior to visit.  ? ? ?No Known Allergies ? ?ROS ?*** ?PE; ?Vitals with BMI 02/24/2021 02/03/2021 01/23/2021  ?Height 5\' 5"  5\' 5"  5\' 5"   ?Weight 240 lbs 240 lbs 236 lbs 6 oz  ?BMI 39.94 39.94 39.34  ?Systolic [id number]  ?Diastolic 94 84 73  ?Pulse 70 72 63  ?Some encounter information is confidential and restricted. Go to Review Flowsheets activity to see all data.  ? ? ? ?*** ?Pertinent labs:  ?Lab Results  ?Component Value Date  ? TSH 2.80 01/27/2021  ? ?Lab Results  ?Component Value Date  ? WBC 5.1 01/27/2021  ? HGB 13.7 01/27/2021  ? HCT 42.6 01/27/2021  ? MCV 91.2 01/27/2021  ? PLT 405.0 (H) 01/27/2021  ? ?Lab Results  ?Component Value Date  ? CREATININE 0.98 01/27/2021  ? BUN 18 01/27/2021  ? NA 139 01/27/2021   ? K 4.6 01/27/2021  ? CL 108 01/27/2021  ? CO2 27 01/27/2021  ? ?Lab Results  ?Component Value Date  ? ALT 23 01/27/2021  ? AST 20 01/27/2021  ? ALKPHOS 32 (L) 01/27/2021  ? BILITOT 0.3 01/27/2021  ? ?Lab Results  ?Component Value Date  ? CHOL 158 01/27/2021  ? ?Lab Results  ?Component Value Date  ? HDL 38.00 (L) 01/27/2021  ? ?Lab Results  ?Component Value Date  ? LDLCALC 107 (H) 01/27/2021  ? ?Lab Results  ?Component Value Date  ? TRIG 66.0 01/27/2021  ? ?Lab Results  ?Component Value Date  ? CHOLHDL 4 01/27/2021  ? ? ?ASSESSMENT AND PLAN:  ? ?No problem-specific Assessment & Plan notes found for this encounter. ? ?Health maintenance exam: ?Reviewed age and gender appropriate health maintenance issues (prudent diet, regular exercise, health risks of tobacco and excessive alcohol, use of seatbelts, fire alarms in home, use of sunscreen).  Also reviewed age and gender appropriate health screening as well as vaccine recommendations. ?Vaccines: Tdap->***. ?Labs: CMET, FLP, TSH,cbc ?Cervical ca screening: per GYN MD. ?Breast ca screening: start annual mammography at age 66 yrs. ?Colon ca screening: average risk patient= as per latest guidelines, start screening at 72 yrs of age. ? ?An After Visit Summary was printed and given to the patient. ? ?FOLLOW UP:  No follow-ups on file. ? ?Signed:  Crissie Sickles, MD           03/05/2022 ? ?

## 2022-03-06 NOTE — Telephone Encounter (Signed)
Pt not feeling well, no charge ?

## 2022-03-12 ENCOUNTER — Other Ambulatory Visit: Payer: Self-pay

## 2022-03-12 ENCOUNTER — Encounter: Payer: Self-pay | Admitting: Family Medicine

## 2022-03-12 ENCOUNTER — Ambulatory Visit (INDEPENDENT_AMBULATORY_CARE_PROVIDER_SITE_OTHER): Payer: Medicaid Other | Admitting: Family Medicine

## 2022-03-12 VITALS — BP 130/85 | HR 75 | Temp 98.1°F | Ht 66.34 in | Wt 246.4 lb

## 2022-03-12 DIAGNOSIS — F410 Panic disorder [episodic paroxysmal anxiety] without agoraphobia: Secondary | ICD-10-CM | POA: Diagnosis not present

## 2022-03-12 DIAGNOSIS — Z0000 Encounter for general adult medical examination without abnormal findings: Secondary | ICD-10-CM | POA: Diagnosis not present

## 2022-03-12 DIAGNOSIS — E669 Obesity, unspecified: Secondary | ICD-10-CM

## 2022-03-12 DIAGNOSIS — Z1231 Encounter for screening mammogram for malignant neoplasm of breast: Secondary | ICD-10-CM

## 2022-03-12 DIAGNOSIS — F3340 Major depressive disorder, recurrent, in remission, unspecified: Secondary | ICD-10-CM | POA: Diagnosis not present

## 2022-03-12 DIAGNOSIS — F411 Generalized anxiety disorder: Secondary | ICD-10-CM | POA: Diagnosis not present

## 2022-03-12 LAB — CBC WITH DIFFERENTIAL/PLATELET
Basophils Absolute: 0 10*3/uL (ref 0.0–0.1)
Basophils Relative: 0.3 % (ref 0.0–3.0)
Eosinophils Absolute: 0.2 10*3/uL (ref 0.0–0.7)
Eosinophils Relative: 3.7 % (ref 0.0–5.0)
HCT: 40.2 % (ref 36.0–46.0)
Hemoglobin: 13.1 g/dL (ref 12.0–15.0)
Lymphocytes Relative: 40.1 % (ref 12.0–46.0)
Lymphs Abs: 2.3 10*3/uL (ref 0.7–4.0)
MCHC: 32.7 g/dL (ref 30.0–36.0)
MCV: 91.8 fl (ref 78.0–100.0)
Monocytes Absolute: 0.7 10*3/uL (ref 0.1–1.0)
Monocytes Relative: 11.2 % (ref 3.0–12.0)
Neutro Abs: 2.6 10*3/uL (ref 1.4–7.7)
Neutrophils Relative %: 44.7 % (ref 43.0–77.0)
Platelets: 415 10*3/uL — ABNORMAL HIGH (ref 150.0–400.0)
RBC: 4.38 Mil/uL (ref 3.87–5.11)
RDW: 14.3 % (ref 11.5–15.5)
WBC: 5.8 10*3/uL (ref 4.0–10.5)

## 2022-03-12 LAB — COMPREHENSIVE METABOLIC PANEL
ALT: 19 U/L (ref 0–35)
AST: 17 U/L (ref 0–37)
Albumin: 4.2 g/dL (ref 3.5–5.2)
Alkaline Phosphatase: 30 U/L — ABNORMAL LOW (ref 39–117)
BUN: 17 mg/dL (ref 6–23)
CO2: 27 mEq/L (ref 19–32)
Calcium: 8.8 mg/dL (ref 8.4–10.5)
Chloride: 106 mEq/L (ref 96–112)
Creatinine, Ser: 1.01 mg/dL (ref 0.40–1.20)
GFR: 71.17 mL/min (ref >60.00)
Glucose, Bld: 106 mg/dL — ABNORMAL HIGH (ref 70–99)
Potassium: 4.2 mEq/L (ref 3.5–5.1)
Sodium: 139 mEq/L (ref 135–145)
Total Bilirubin: 0.6 mg/dL (ref 0.2–1.2)
Total Protein: 6.9 g/dL (ref 6.0–8.3)

## 2022-03-12 LAB — LIPID PANEL
Cholesterol: 145 mg/dL (ref 0–200)
HDL: 37.3 mg/dL — ABNORMAL LOW (ref >39.00)
LDL Cholesterol: 85 mg/dL (ref 0–99)
NonHDL: 107.6
Total CHOL/HDL Ratio: 4
Triglycerides: 113 mg/dL (ref 0.0–149.0)
VLDL: 22.6 mg/dL (ref 0.0–40.0)

## 2022-03-12 LAB — TSH: TSH: 2.45 u[IU]/mL (ref 0.35–5.50)

## 2022-03-12 MED ORDER — LORAZEPAM 1 MG PO TABS: ORAL_TABLET | ORAL | 1 refills | Status: DC

## 2022-03-12 MED ORDER — FLUOXETINE HCL 20 MG PO CAPS: 20.0000 mg | ORAL_CAPSULE | Freq: Every day | ORAL | 3 refills | Status: DC

## 2022-03-12 NOTE — Progress Notes (Signed)
Office Note ?03/12/2022 ? ?CC: No chief complaint on file. ? ? ?HPI:  ?Patient is a 36 y.o. female who is here for annual health maintenance exam and f/u anx/dep. ?Doing pretty good but still struggling with some anxiety and decreased mood, waxes and wanes.  Taking care of chronically ill family member in her home. ?Takes up to 6 lorazepam per week but some weeks none. ?Takes fluoxetine 20 mg a day without problem. ? ?PMP AWARE reviewed today: most recent rx for loraz was filled 12/06/21, # 60, rx by me. ?No red flags. ? ?Past Medical History:  ?Diagnosis Date  ? Abnormal Pap smear   ? Anxiety and depression   ? Asthma   ? childhood  ? Atypical chest pain 09/01/2016  ? ED visit: +chest wall TTP.  D-dimer neg, TnI neg, CXR neg, EKG nl, no hypoxia.  ? Family history of congenital hearing loss   ? Audiology eval at [facility]NT normal.  ? History of uterine fibroid   ? Metrorrhagia   ? with pelvic pain.  Transvag+transabd pelvic u/s NORMAL 05/2014.  ? Peripheral edema   ? bilat LL's: suspected venous insuff 06/2019; low dose prn lasix rx'd.  ? 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 ? ?Past Surgical History:  ?Procedure Laterality Date  ? COLPOSCOPY W/ BIOPSY / CURETTAGE    ? needs annual paps  ? ? ?Family History  ?Problem Relation Age of Onset  ? Hypertension Mother   ? Depression Mother   ? Deafness Mother   ? Asthma Father   ? Alcohol abuse Father   ? Hypertension Maternal Grandmother   ? Depression Maternal Grandmother   ? Diabetes Maternal Grandmother   ? Anxiety disorder Maternal Grandmother   ? Deafness Maternal Grandfather   ? Hearing loss Sister 0  ?     both parents, sister, grandparents were all born deaf  ? ? ?Social History  ? ?Socioeconomic History  ? Marital status: Single  ?  Spouse name: Not on file  ? Number of children: 3  ? Years of education: 60  ? Highest education level: Not on file  ?Occupational History  ? Not on file  ?Tobacco Use  ? Smoking status: Never  ? Smokeless tobacco: Never  ?Vaping Use   ? Vaping Use: Never used  ?Substance and Sexual Activity  ? Alcohol use: Yes  ?  Alcohol/week: 0.0 standard drinks  ?  Comment: social   ? Drug use: No  ? Sexual activity: Not Currently  ?  Partners: Male  ?  Birth control/protection: None  ?Other Topics Concern  ? Not on file  ?Social History Narrative  ? Single, 3 sons.  ? 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  ? No Tob, occ alcohol, no drugs.  ? No exercise.  ? Fun: [name] out with her children.  ? Denies abuse and feels safe at home.   ? ?Social Determinants of Health  ? ?Financial Resource Strain: Not on file  ?Food Insecurity: Not on file  ?Transportation Needs: Not on file  ?Physical Activity: Not on file  ?Stress: Not on file  ?Social Connections: Not on file  ?Intimate Partner Violence: Not on file  ? ? ?Outpatient Medications Prior to Visit  ?Medication Sig Dispense Refill  ? FLUoxetine (PROZAC) 20 MG capsule Take 1 capsule (20 mg total) by mouth daily. IN OFFICE VISIT NEEDED FOR FURTHER REFILLS 90 capsule 3  ? ibuprofen (ADVIL) 800 MG tablet TAKE 1 TABLET(800  MG) BY MOUTH EVERY 8 HOURS AS NEEDED 30 tablet 5  ? LORazepam (ATIVAN) 1 MG tablet 1-2 tabs po bid prn severe anxiety 60 tablet 0  ? metroNIDAZOLE (FLAGYL) 500 MG tablet Take 1 tablet (500 mg total) by mouth 2 (two) times daily. (Patient not taking: Reported on 11/08/2021) 14 tablet 0  ? ?No facility-administered medications prior to visit.  ? ? ?No Known Allergies ? ?ROS ?Review of Systems  ?Constitutional:  Negative for appetite change, chills, fatigue and fever.  ?HENT:  Negative for congestion, dental problem, ear pain and sore throat.   ?Eyes:  Negative for discharge, redness and visual disturbance.  ?Respiratory:  Negative for cough, chest tightness, shortness of breath and wheezing.   ?Cardiovascular:  Negative for chest pain, palpitations and leg swelling.  ?Gastrointestinal:  Negative for abdominal pain, blood in stool, diarrhea, nausea and vomiting.  ?Genitourinary:  Negative for  difficulty urinating, dysuria, flank pain, frequency, hematuria and urgency.  ?Musculoskeletal:  Negative for arthralgias, back pain, joint swelling, myalgias and neck stiffness.  ?Skin:  Negative for pallor and rash.  ?Neurological:  Negative for dizziness, speech difficulty, weakness and headaches.  ?Hematological:  Negative for adenopathy. Does not bruise/bleed easily.  ?Psychiatric/Behavioral:  Negative for confusion and sleep disturbance. The patient is not nervous/anxious.   ? ?PE; ?Vitals with BMI 02/24/2021 02/03/2021 01/23/2021  ?Height 5\' 5"  5\' 5"  5\' 5"   ?Weight 240 lbs 240 lbs 236 lbs 6 oz  ?BMI 39.94 39.94 39.34  ?Systolic 155 127 [id number]  ?Diastolic 94 84 73  ?Pulse 70 72 63  ?Some encounter information is confidential and restricted. Go to Review Flowsheets activity to see all data.  ? ?Gen: Alert, well appearing.  Patient is oriented to person, place, time, and situation. ?AFFECT: pleasant, lucid thought and speech. ?ENT: Ears: EACs clear, normal epithelium.  TMs with good light reflex and landmarks bilaterally.  Eyes: no injection, icteris, swelling, or exudate.  EOMI, PERRLA. ?Nose: no drainage or turbinate edema/swelling.  No injection or focal lesion.  Mouth: lips without lesion/swelling.  Oral mucosa pink and moist.  Dentition intact and without obvious caries or gingival swelling.  Oropharynx without erythema, exudate, or swelling.  ?Neck: supple/nontender.  No LAD, mass, or TM.  Carotid pulses 2+ bilaterally, without bruits. ?CV: RRR, no m/r/g.   ?LUNGS: CTA bilat, nonlabored resps, good aeration in all lung fields. ?ABD: soft, NT, ND, BS normal.  No hepatospenomegaly or mass.  No bruits. ?EXT: no clubbing, cyanosis, or edema.  ?Musculoskeletal: no joint swelling, erythema, warmth, or tenderness.  ROM of all joints intact. ?Skin - no sores or suspicious lesions or rashes or color changes ? ?Pertinent labs:  ?Lab Results  ?Component Value Date  ? TSH 2.80 01/27/2021  ? ?Lab Results  ?Component Value  Date  ? WBC 5.1 01/27/2021  ? HGB 13.7 01/27/2021  ? HCT 42.6 01/27/2021  ? MCV 91.2 01/27/2021  ? PLT 405.0 (H) 01/27/2021  ? ?Lab Results  ?Component Value Date  ? CREATININE 0.98 01/27/2021  ? BUN 18 01/27/2021  ? NA 139 01/27/2021  ? K 4.6 01/27/2021  ? CL 108 01/27/2021  ? CO2 27 01/27/2021  ? ?Lab Results  ?Component Value Date  ? ALT 23 01/27/2021  ? AST 20 01/27/2021  ? ALKPHOS 32 (L) 01/27/2021  ? BILITOT 0.3 01/27/2021  ? ?Lab Results  ?Component Value Date  ? CHOL 158 01/27/2021  ? ?Lab Results  ?Component Value Date  ? HDL 38.00 (L) 01/27/2021  ? ?  Lab Results  ?Component Value Date  ? LDLCALC 107 (H) 01/27/2021  ? ?Lab Results  ?Component Value Date  ? TRIG 66.0 01/27/2021  ? ?Lab Results  ?Component Value Date  ? CHOLHDL 4 01/27/2021  ? ?ASSESSMENT AND PLAN:  ? ?#1 anxiety and depression. ?Lorazepam 1 mg, 1-2 twice daily as needed, #60, refill x1. ?Continue fluoxetine 20 mg a day. ?She requests referral for counselor so I referred her to [facility] behavioral health today. ? ?#2 obesity, class II. ?Patient requests referral for evaluation of possible use of weight loss medication. ? ?3.Health maintenance exam: ?Reviewed age and gender appropriate health maintenance issues (prudent diet, regular exercise, health risks of tobacco and excessive alcohol, use of seatbelts, fire alarms in home, use of sunscreen).  Also reviewed age and gender appropriate health screening as well as vaccine recommendations. ?Vaccines: Tdap-> patient declined. ?Labs: fasting HP labs ordered. ?Cervical ca screening: per Dr. Clearance Coots, GYN. ?Breast ca screening: annual mammogram starting age 84. ? ?An After Visit Summary was printed and given to the patient. ? ?FOLLOW UP:  No follow-ups on file. ? ?Signed:  Santiago Bumpers, MD           03/12/2022 ? ?

## 2022-03-12 NOTE — Patient Instructions (Signed)

## 2022-03-13 NOTE — Telephone Encounter (Signed)
Spoke with pt regarding labs and instructions.   

## 2022-03-14 ENCOUNTER — Encounter: Payer: Self-pay | Admitting: Obstetrics

## 2022-03-14 ENCOUNTER — Other Ambulatory Visit: Payer: Self-pay

## 2022-03-14 ENCOUNTER — Other Ambulatory Visit (HOSPITAL_COMMUNITY)
Admission: RE | Admit: 2022-03-14 | Discharge: 2022-03-14 | Disposition: A | Discharge status: Home / Self Care | Payer: Medicaid Other | Source: Ambulatory Visit | Attending: Obstetrics | Admitting: Obstetrics

## 2022-03-14 ENCOUNTER — Ambulatory Visit (INDEPENDENT_AMBULATORY_CARE_PROVIDER_SITE_OTHER): Payer: Medicaid Other | Admitting: Obstetrics

## 2022-03-14 VITALS — BP 151/84 | HR 81 | Ht 66.0 in | Wt 245.7 lb

## 2022-03-14 DIAGNOSIS — Z113 Encounter for screening for infections with a predominantly sexual mode of transmission: Secondary | ICD-10-CM

## 2022-03-14 DIAGNOSIS — E669 Obesity, unspecified: Secondary | ICD-10-CM

## 2022-03-14 DIAGNOSIS — N946 Dysmenorrhea, unspecified: Secondary | ICD-10-CM

## 2022-03-14 DIAGNOSIS — N898 Other specified noninflammatory disorders of vagina: Secondary | ICD-10-CM

## 2022-03-14 DIAGNOSIS — Z01419 Encounter for gynecological examination (general) (routine) without abnormal findings: Secondary | ICD-10-CM

## 2022-03-14 DIAGNOSIS — M62838 Other muscle spasm: Secondary | ICD-10-CM | POA: Diagnosis not present

## 2022-03-14 DIAGNOSIS — Z3009 Encounter for other general counseling and advice on contraception: Secondary | ICD-10-CM | POA: Diagnosis not present

## 2022-03-14 MED ORDER — IBUPROFEN 800 MG PO TABS: ORAL_TABLET | ORAL | 5 refills | Status: DC

## 2022-03-14 MED ORDER — CYCLOBENZAPRINE HCL 10 MG PO TABS: 10.0000 mg | ORAL_TABLET | Freq: Three times a day (TID) | ORAL | 2 refills | Status: DC | PRN

## 2022-03-14 NOTE — Progress Notes (Signed)
RGYN in office for annual exam. Pt c/o red spot on the left breast. No lumps or pain. Pt is requesting STI testing today.  ? ?Last PAP: 01-2021 high risk HPV ?Colposcopy: 02-2021 ?

## 2022-03-14 NOTE — Progress Notes (Signed)
? ?Subjective: ? ? ?  ?  ? Valerie Robbins-Draft is a 36 y.o. female here for a routine exam.  Current complaints: Vaginal discharge.   ? ?Personal health questionnaire:  ?Is patient Ashkenazi Jewish, have a family history of breast and/or ovarian cancer: no ?Is there a family history of uterine cancer diagnosed at age < 31, gastrointestinal cancer, urinary tract cancer, family member who is a Personnel officer syndrome-associated carrier: no ?Is the patient overweight and hypertensive, family history of diabetes, personal history of gestational diabetes, preeclampsia or PCOS: no ?Is patient over 80, have PCOS,  family history of premature CHD under age 44, diabetes, smoke, have hypertension or peripheral artery disease:  no ?At any time, has a partner hit, kicked or otherwise hurt or frightened you?: no ?Over the past 2 weeks, have you felt down, depressed or hopeless?: no ?Over the past 2 weeks, have you felt little interest or pleasure in doing things?:no ? ? ?Gynecologic History ?Patient's last menstrual period was 03/10/2022 (exact date). ?Contraception: none ?Last Pap: 02-03-2021. Results were: LGSIL.  Colpo:  LGSIL ?Last mammogram: n/a. Results were: n/a ? ?Obstetric History ?OB History  ?Gravida Para Term Preterm AB Living  ?5 3 3  0 2 3  ?SAB IAB Ectopic Multiple Live Births  ?1 0 1 0 3  ?  ?# Outcome Date GA Lbr Len/2nd Weight Sex Delivery Anes PTL Lv  ?5 Term 03/02/12 [redacted]w[redacted]d 06:45 / 00:02 7 lb 8.6 oz (3.42 kg) M Vag-Vacuum EPI  LIV  ?   Birth Comments: WNL  ?4 SAB           ?3 Ectopic           ?2 Term     M Vag-Spont   LIV  ?1 Term     M Vag-Spont  Y LIV  ? ? ?Past Medical History:  ?Diagnosis Date  ? Abnormal Pap smear   ? Anxiety and depression   ? Asthma   ? childhood  ? Atypical chest pain 09/01/2016  ? ED visit: +chest wall TTP.  D-dimer neg, TnI neg, CXR neg, EKG nl, no hypoxia.  ? Family history of congenital hearing loss   ? Audiology eval at [facility] ENT normal.  ? History of uterine fibroid   ? Metrorrhagia   ?  with pelvic pain.  Transvag+transabd pelvic u/s NORMAL 05/2014.  ? Peripheral edema   ? bilat LL's: suspected venous insuff 06/2019; low dose prn lasix rx'd.  ? 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  ?Past Surgical History:  ?Procedure Laterality Date  ? COLPOSCOPY W/ BIOPSY / CURETTAGE    ? needs annual paps  ?  ? ?Current Outpatient Medications:  ?  cyclobenzaprine (FLEXERIL) 10 MG tablet, Take 1 tablet (10 mg total) by mouth every 8 (eight) hours as needed for muscle spasms., Disp: 30 tablet, Rfl: 2 ?  FLUoxetine (PROZAC) 20 MG capsule, Take 1 capsule (20 mg total) by mouth daily., Disp: 90 capsule, Rfl: 3 ?  ibuprofen (ADVIL) 800 MG tablet, TAKE 1 TABLET(800 MG) BY MOUTH EVERY 8 HOURS AS NEEDED, Disp: 30 tablet, Rfl: 5 ?  LORazepam (ATIVAN) 1 MG tablet, 1-2 tabs po bid prn severe anxiety (Patient not taking: Reported on 03/14/2022), Disp: 60 tablet, Rfl: 1 ?  metroNIDAZOLE (FLAGYL) 500 MG tablet, Take 1 tablet (500 mg total) by mouth 2 (two) times daily. (Patient not taking: Reported on 03/14/2022), Disp: 14 tablet, Rfl: 0 ?No Known Allergies  ?Social History  ? ?  Tobacco Use  ? Smoking status: Never  ? Smokeless tobacco: Never  ?Substance Use Topics  ? Alcohol use: Yes  ?  Alcohol/week: 0.0 standard drinks  ?  Comment: social   ?  ?Family History  ?Problem Relation Age of Onset  ? Hypertension Mother   ? Depression Mother   ? Deafness Mother   ? Asthma Father   ? Alcohol abuse Father   ? Hypertension Maternal Grandmother   ? Depression Maternal Grandmother   ? Diabetes Maternal Grandmother   ? Anxiety disorder Maternal Grandmother   ? Deafness Maternal Grandfather   ? Hearing loss Sister 0  ?     both parents, sister, grandparents were all born deaf  ?  ? ? ?Review of Systems ? ?Constitutional: negative for fatigue and weight loss ?Respiratory: negative for cough and wheezing ?Cardiovascular: negative for chest pain, fatigue and palpitations ?Gastrointestinal: negative for abdominal pain and change in bowel  habits ?Musculoskeletal:negative for myalgias ?Neurological: negative for gait problems and tremors ?Behavioral/Psych: negative for abusive relationship, depression ?Endocrine: negative for temperature intolerance    ?Genitourinary:negative for abnormal menstrual periods, genital lesions, hot flashes, sexual problems and vaginal discharge ?Integument/breast: negative for breast lump, breast tenderness, nipple discharge and skin lesion(s) ? ?  ?Objective:  ? ?    ?BP (!) 151/84   Pulse 81   Ht 5\' 6"  (1.676 m)   Wt 245 lb 11.2 oz (111.4 kg)   LMP 03/10/2022 (Exact Date)   BMI 39.66 kg/m?  ?General:   alert  ?Skin:   no rash or abnormalities  ?Lungs:   clear to auscultation bilaterally  ?Heart:   regular rate and rhythm, S1, S2 normal, no murmur, click, rub or gallop  ?Breasts:   normal without suspicious masses, skin or nipple changes or axillary nodes  ?Abdomen:  normal findings: no organomegaly, soft, non-tender and no hernia  ?Pelvis:  External genitalia: normal general appearance ?Urinary system: urethral meatus normal and bladder without fullness, nontender ?Vaginal: normal without tenderness, induration or masses ?Cervix: normal appearance ?Adnexa: normal bimanual exam ?Uterus: anteverted and non-tender, normal size  ? ?Lab Review ?Urine pregnancy test ?Labs reviewed yes ?Radiologic studies reviewed no ? ?I have spent a total of 20 minutes of face-to-face time, excluding clinical staff time, reviewing notes and preparing to see patient, ordering tests and/or medications, and counseling the patient.  ? ?Assessment:  ? ? 1. Encounter for routine gynecological examination with Papanicolaou smear of cervix ?Rx: ?- Cytology - PAP( [place]) ? ?2. Vaginal discharge ?Rx: ?- Cervicovaginal ancillary only( [place]) ? ?3. Obesity (BMI 35.0-39.9 without comorbidity) ?- weight reduction with the aid of dietary changes, exercise and behavioral modification recommended ? ?4. Encounter for other general counseling  and advice on contraception ?- declines hormonal contraception ?- condoms recommended for STI prevention ?  ?  ?Plan:  ? ? Education reviewed: calcium supplements, depression evaluation, low fat, low cholesterol diet, safe sex/STD prevention, self breast exams, and weight bearing exercise. ?Follow up in: 1 year.  ? ?Meds ordered this encounter  ?Medications  ? cyclobenzaprine (FLEXERIL) 10 MG tablet  ?  Sig: Take 1 tablet (10 mg total) by mouth every 8 (eight) hours as needed for muscle spasms.  ?  Dispense:  30 tablet  ?  Refill:  2  ? ibuprofen (ADVIL) 800 MG tablet  ?  Sig: TAKE 1 TABLET(800 MG) BY MOUTH EVERY 8 HOURS AS NEEDED  ?  Dispense:  30 tablet  ?  Refill:  5  ? ? ? ? ? Brock Bad, MD ?03/14/2022 9:09 AM  ?

## 2022-03-15 ENCOUNTER — Encounter: Payer: Self-pay | Admitting: Family Medicine

## 2022-03-15 LAB — CERVICOVAGINAL ANCILLARY ONLY
Bacterial Vaginitis (gardnerella): NEGATIVE
Candida Glabrata: NEGATIVE
Candida Vaginitis: NEGATIVE
Chlamydia: NEGATIVE
Comment: NEGATIVE
Comment: NEGATIVE
Comment: NEGATIVE
Comment: NEGATIVE
Comment: NEGATIVE
Comment: NORMAL
Neisseria Gonorrhea: NEGATIVE
Trichomonas: NEGATIVE

## 2022-03-16 NOTE — Telephone Encounter (Signed)
Nothing further needed 

## 2022-03-16 NOTE — Telephone Encounter (Signed)
Please advise on glucose ?

## 2022-03-16 NOTE — Telephone Encounter (Signed)
Reassure. ?Very mild elevation of glucose not of clinical concern at this time. ? ?I recommend she minimize sugars (esp any colas that are not diet, and also sweet tea), limit dessert foods, snack foods, fast foods, and restaurant foods.  Minimize potatoes, white bread, rice, and pasta.  Increase fresh fruits and fresh vegetables. Drink more water. ?I also recommend cardiovascular exercise: start slow and work your way up to a goal of doing vigorous exercise such as running, biking, swimming, stair stepper, etc-->5-10 min 1-2 days per week at first.  Gradually increase intensity of exercise and build up to 30 min a day 5-7 days a week. ? ?

## 2022-03-19 LAB — CYTOLOGY - PAP
Adequacy: ABSENT
Comment: NEGATIVE
Diagnosis: NEGATIVE
High risk HPV: NEGATIVE

## 2022-04-30 ENCOUNTER — Other Ambulatory Visit: Payer: Self-pay | Admitting: Obstetrics

## 2022-04-30 DIAGNOSIS — J301 Allergic rhinitis due to pollen: Secondary | ICD-10-CM

## 2022-04-30 MED ORDER — PHENTERMINE HCL 37.5 MG PO CAPS: 37.5000 mg | ORAL_CAPSULE | ORAL | 2 refills | Status: DC

## 2022-04-30 MED ORDER — LORATADINE 10 MG PO TABS: 10.0000 mg | ORAL_TABLET | Freq: Every day | ORAL | 11 refills | Status: DC

## 2022-05-10 ENCOUNTER — Encounter: Payer: Self-pay | Admitting: Obstetrics

## 2022-05-10 ENCOUNTER — Ambulatory Visit (INDEPENDENT_AMBULATORY_CARE_PROVIDER_SITE_OTHER): Payer: Medicaid Other | Admitting: Obstetrics

## 2022-05-10 VITALS — BP 135/86 | HR 75 | Ht 65.0 in | Wt 237.7 lb

## 2022-05-10 DIAGNOSIS — A630 Anogenital (venereal) warts: Secondary | ICD-10-CM

## 2022-05-10 MED ORDER — IMIQUIMOD 5 % EX CREA: TOPICAL_CREAM | CUTANEOUS | 3 refills | Status: DC

## 2022-05-10 NOTE — Progress Notes (Signed)
Patient presents for having three bumps on her groin area. She states that she bumps are not painful or itchy. Bumps have never blistered. Sx has been present for about a month.  Last pap: 03/14/22  Normal

## 2022-05-10 NOTE — Progress Notes (Signed)
Patient ID: Valerie Robbins, female   DOB: March 16, 1986, 36 y.o.   MRN: [id number]  Chief Complaint  Patient presents with   Gynecologic Exam    HPI Valerie Robbins is a 36 y.o. female.  Complains of " bumps " in groin area.  No pain, itching or drainage from bumps. HPI  Past Medical History:  Diagnosis Date   Abnormal Pap smear    Anxiety and depression    Asthma    childhood   Atypical chest pain 09/01/2016   ED visit: +chest wall TTP.  D-dimer neg, TnI neg, CXR neg, EKG nl, no hypoxia.   Family history of congenital hearing loss    Audiology eval at [facility] ENT normal.   History of uterine fibroid    Metrorrhagia    with pelvic pain.  Transvag+transabd pelvic u/s NORMAL 05/2014.   Peripheral edema    bilat LL's: suspected venous insuff 06/2019; low dose prn lasix rx'd.   Recurrent genital herpes    HSV 2    Past Surgical History:  Procedure Laterality Date   COLPOSCOPY W/ BIOPSY / CURETTAGE     needs annual paps    Family History  Problem Relation Age of Onset   Hypertension Mother    Depression Mother    Deafness Mother    Asthma Father    Alcohol abuse Father    Hypertension Maternal Grandmother    Depression Maternal Grandmother    Diabetes Maternal Grandmother    Anxiety disorder Maternal Grandmother    Deafness Maternal Grandfather    Hearing loss Sister 0       both parents, sister, grandparents were all born deaf    Social History Social History   Tobacco Use   Smoking status: Never   Smokeless tobacco: Never  Vaping Use   Vaping Use: Never used  Substance Use Topics   Alcohol use: Yes    Alcohol/week: 0.0 standard drinks    Comment: social    Drug use: No    No Known Allergies  Current Outpatient Medications  Medication Sig Dispense Refill   cyclobenzaprine (FLEXERIL) 10 MG tablet Take 1 tablet (10 mg total) by mouth every 8 (eight) hours as needed for muscle spasms. 30 tablet 2   FLUoxetine (PROZAC) 20 MG capsule Take 1 capsule  (20 mg total) by mouth daily. 90 capsule 3   ibuprofen (ADVIL) 800 MG tablet TAKE 1 TABLET(800 MG) BY MOUTH EVERY 8 HOURS AS NEEDED 30 tablet 5   imiquimod (ALDARA) 5 % cream Apply topically at bedtime 3 times a week for up to 16 weeks..  Wash off after 8 hours. 12 each 3   loratadine (CLARITIN) 10 MG tablet Take 1 tablet (10 mg total) by mouth daily. 30 tablet 11   LORazepam (ATIVAN) 1 MG tablet 1-2 tabs po bid prn severe anxiety 60 tablet 1   phentermine 37.5 MG capsule Take 1 capsule (37.5 mg total) by mouth every morning. 30 capsule 2   metroNIDAZOLE (FLAGYL) 500 MG tablet Take 1 tablet (500 mg total) by mouth 2 (two) times daily. 14 tablet 0   No current facility-administered medications for this visit.    Review of Systems Review of Systems Constitutional: negative for fatigue and weight loss Respiratory: negative for cough and wheezing Cardiovascular: negative for chest pain, fatigue and palpitations Gastrointestinal: negative for abdominal pain and change in bowel habits Genitourinary:positive for bumps in groin area Integument/breast: negative for nipple discharge Musculoskeletal:negative for myalgias Neurological: negative for gait problems  and tremors Behavioral/Psych: negative for abusive relationship, depression Endocrine: negative for temperature intolerance      Blood pressure 135/86, pulse 75, height 5\' 5"  (1.651 m), weight 237 lb 11.2 oz (107.8 kg), last menstrual period 05/02/2022.  Physical Exam Physical Exam General:   Alert and no distress  Skin:   no rash or abnormalities  Lungs:   clear to auscultation bilaterally  Heart:   regular rate and rhythm, S1, S2 normal, no murmur, click, rub or gallop  Breasts:   normal without suspicious masses, skin or nipple changes or axillary nodes  Abdomen:  normal findings: no organomegaly, soft, non-tender and no hernia  Pelvis:  External genitalia: normal general appearance Urinary system: urethral meatus normal and bladder  without fullness, nontender Vaginal: normal without tenderness, induration or masses Cervix: normal appearance Adnexa: normal bimanual exam Uterus: anteverted and non-tender, normal size   Left inner buttocks - thigh area:  2 small genital warts, non tender   I have spent a total of 20 minutes of face-to-face time, excluding clinical staff time, reviewing notes and preparing to see patient, ordering tests and/or medications, and counseling the patient.   Data Reviewed Labs  Assessment     1. Genital warts Rx: - imiquimod (ALDARA) 5 % cream; Apply topically at bedtime 3 times a week for up to 16 weeks..  Wash off after 8 hours.  Dispense: 12 each; Refill: 3     Plan   Follow up in 3 months  Meds ordered this encounter  Medications   imiquimod (ALDARA) 5 % cream    Sig: Apply topically at bedtime 3 times a week for up to 16 weeks..  Wash off after 8 hours.    Dispense:  12 each    Refill:  3      Shelly Bombard, MD 05/10/2022 3:00 PM

## 2022-05-11 ENCOUNTER — Encounter: Payer: Self-pay | Admitting: Obstetrics

## 2022-05-16 ENCOUNTER — Ambulatory Visit (INDEPENDENT_AMBULATORY_CARE_PROVIDER_SITE_OTHER): Payer: Medicaid Other | Admitting: Family Medicine

## 2022-05-16 ENCOUNTER — Encounter (INDEPENDENT_AMBULATORY_CARE_PROVIDER_SITE_OTHER): Payer: Self-pay | Admitting: Family Medicine

## 2022-05-16 VITALS — BP 118/74 | HR 65 | Temp 98.2°F | Ht 66.0 in | Wt 239.0 lb

## 2022-05-16 DIAGNOSIS — E559 Vitamin D deficiency, unspecified: Secondary | ICD-10-CM | POA: Diagnosis not present

## 2022-05-16 DIAGNOSIS — Z6838 Body mass index (BMI) 38.0-38.9, adult: Secondary | ICD-10-CM

## 2022-05-16 DIAGNOSIS — R5383 Other fatigue: Secondary | ICD-10-CM

## 2022-05-16 DIAGNOSIS — E668 Other obesity: Secondary | ICD-10-CM

## 2022-05-16 DIAGNOSIS — R0602 Shortness of breath: Secondary | ICD-10-CM | POA: Diagnosis not present

## 2022-05-16 DIAGNOSIS — R739 Hyperglycemia, unspecified: Secondary | ICD-10-CM | POA: Diagnosis not present

## 2022-05-16 DIAGNOSIS — F32A Depression, unspecified: Secondary | ICD-10-CM | POA: Diagnosis not present

## 2022-05-16 DIAGNOSIS — F419 Anxiety disorder, unspecified: Secondary | ICD-10-CM

## 2022-05-17 LAB — VITAMIN B12: Vitamin B-12: 309 pg/mL (ref 232–1245)

## 2022-05-17 LAB — INSULIN, RANDOM: INSULIN: 9.5 u[IU]/mL (ref 2.6–24.9)

## 2022-05-17 LAB — HEMOGLOBIN A1C
Est. average glucose Bld gHb Est-mCnc: 114 mg/dL
Hgb A1c MFr Bld: 5.6 % (ref 4.8–5.6)

## 2022-05-17 LAB — T4, FREE: Free T4: 0.88 ng/dL (ref 0.82–1.77)

## 2022-05-17 LAB — T3: T3, Total: 71 ng/dL (ref 71–180)

## 2022-05-17 LAB — FOLATE: Folate: >20 ng/mL (ref >3.0)

## 2022-05-17 LAB — VITAMIN D 25 HYDROXY (VIT D DEFICIENCY, FRACTURES): Vit D, 25-Hydroxy: 27.3 ng/mL — ABNORMAL LOW (ref 30.0–100.0)

## 2022-05-23 ENCOUNTER — Ambulatory Visit (INDEPENDENT_AMBULATORY_CARE_PROVIDER_SITE_OTHER): Payer: Self-pay | Admitting: Bariatrics

## 2022-05-24 NOTE — Progress Notes (Addendum)
Chief Complaint:   OBESITY Valerie Robbins (MR# [id number]) is a 36 y.o. female who presents for evaluation and treatment of obesity and related comorbidities. Current BMI is Body mass index is 38.58 kg/m. Valerie Robbins has been struggling with her weight for many years and has been unsuccessful in either losing weight, maintaining weight loss, or reaching her healthy weight goal.  Valerie Robbins was referred by her primary care physician. She is currently on phentermine. She works as Research officer, political party. She is eating out daily ([employer], [employer], and [employer]). In the morning she may have a biscuit, bacon, egg, cheese (satisfied). She eats chips or popcorn throughout the day. She may have [employer] tenders or Classic chicken sandwich meal with medium fries with 1/2 sweet tea and 1/2 lemonade (she eats all and will drink most). Supper is chicken, pasta alfredo with shrimp or [employer] (same as above).   Valerie Robbins is currently in the action stage of change and ready to dedicate time achieving and maintaining a healthier weight. Valerie Robbins is interested in becoming our patient and working on intensive lifestyle modifications including (but not limited to) diet and exercise for weight loss.  Valerie Robbins's habits were reviewed today and are as follows: Her family eats meals together, she thinks her family will eat healthier with her, her desired weight loss is 39-54 pounds, she started gaining weight 2 years ago, her heaviest weight ever was 239 pounds, she has significant food cravings issues, she snacks frequently in the evenings, she wakes up frequently in the middle of the night to eat, she is frequently drinking liquids with calories, she frequently makes poor food choices, she has problems with excessive hunger, she frequently eats larger portions than normal, she has binge eating behaviors, and she struggles with emotional eating.  Depression Screen Valerie Robbins's Food and Mood (modified  PHQ-9) score was 17.     05/16/2022    9:33 AM  Depression screen PHQ 2/9  Decreased Interest 2  Down, Depressed, Hopeless 3  PHQ - 2 Score 5  Altered sleeping 3  Tired, decreased energy 3  Change in appetite 2  Feeling bad or failure about yourself  1  Trouble concentrating 1  Moving slowly or fidgety/restless 2  Suicidal thoughts 0  PHQ-9 Score 17  Difficult doing work/chores Somewhat difficult   Subjective:   1. Other fatigue Valerie Robbins admits to daytime somnolence and admits to waking up still tired. Patient has a history of symptoms of daytime fatigue and morning fatigue. Valerie Robbins generally gets 5 hours of sleep per night, and states that she has difficulty falling asleep. Snoring is present. Apneic episodes are not present. Epworth Sleepiness Score is 6.  Valerie Robbins's EKG showed normal sinus rhythm at 64 bpm.   2. SOBOE (shortness of breath on exertion) Valerie Robbins notes increasing shortness of breath with exercising and seems to be worsening over time with weight gain. She notes getting out of breath sooner with activity than she used to. This has not gotten worse recently. Valerie Robbins denies shortness of breath at rest or orthopnea.   3. Hyperglycemia Valerie Robbins had an elevation of fasting blood sugar in March 2023. She is not on medication currently.   4. Vitamin D deficiency Valerie Robbins is currently not on Vitamin D supplementation. She notes fatigue.   5. Anxiety and depression Valerie Robbins is currently Fluoxetine 20 mg. She feels very well managed on current dose.   Assessment/Plan:   1. Other fatigue Valerie Robbins does feel that  her weight is causing her energy to be lower than it should be. Fatigue may be related to obesity, depression or many other causes. Labs will be ordered, and in the meanwhile, Valerie Robbins will focus on self care including making healthy food choices, increasing physical activity and focusing on stress reduction. We will review EKG, IC, and labs today.   - EKG 12-Lead -  Vitamin B12 - Folate - T3 - T4, free  2. SOBOE (shortness of breath on exertion) Valerie Robbins does feel that she gets out of breath more easily that she used to when she exercises. Valerie Robbins's shortness of breath appears to be obesity related and exercise induced. She has agreed to work on weight loss and gradually increase exercise to treat her exercise induced shortness of breath. Will continue to monitor closely.   3. Hyperglycemia We will check A1C, insulin level today.   - Hemoglobin A1c - Insulin, random  4. Vitamin D deficiency Low Vitamin D level contributes to fatigue and are associated with obesity, breast, and colon cancer. We will check Vitamin D level today and Valerie Robbins will follow-up for routine testing of Vitamin D, at least 2-3 times per year to avoid over-replacement.  - VITAMIN D 25 Hydroxy (Vit-D Deficiency, Fractures)  5. Anxiety and depression Valerie Robbins will continue current medication. We will follow up on symptoms management at next appointment. Behavior modification techniques were discussed today to help Valerie Robbins deal with her emotional/non-hunger eating behaviors.  Orders and follow up as documented in patient record.   6. Class 2 severe obesity with serious comorbidity and body mass index (BMI) of 38.0 to 38.9 in adult, unspecified obesity type (HCC) Valerie Robbins is currently in the action stage of change and her goal is to continue with weight loss efforts. I recommend Valerie Robbins begin the structured treatment plan as follows:  She has agreed to the Category 3 Plan plus 8 ounces and keeping a food journal and adhering to recommended goals of 350-500 calories at lunch and 450-600 calories at dinner and 30 plus at lunch or 40 plus at dinner grams of protein.  Exercise goals: No exercise has been prescribed at this time.   Behavioral modification strategies: increasing lean protein intake, decreasing simple carbohydrates, decreasing eating out, meal planning and cooking strategies,  and emotional eating strategies.  She was informed of the importance of frequent follow-up visits to maximize her success with intensive lifestyle modifications for her multiple health conditions. She was informed we would discuss her lab results at her next visit unless there is a critical issue that needs to be addressed sooner. Valerie Robbins agreed to keep her next visit at the agreed upon time to discuss these results.  Objective:   Blood pressure 118/74, pulse 65, temperature 98.2 F (36.8 C), height 5\' 6"  (1.676 m), weight 239 lb (108.4 kg), last menstrual period 05/02/2022, SpO2 100 %. Body mass index is 38.58 kg/m.  EKG: Normal sinus rhythm, rate 64 bpm.  Indirect Calorimeter completed today shows a VO2 of 228 and a REE of 1570.  Her calculated basal metabolic rate is 07/02/2022 thus her basal metabolic rate is worse than expected.  General: Cooperative, alert, well developed, in no acute distress. HEENT: Conjunctivae and lids unremarkable. Cardiovascular: Regular rhythm.  Lungs: Normal work of breathing. Neurologic: No focal deficits.   Lab Results  Component Value Date   CREATININE 1.01 03/12/2022   BUN 17 03/12/2022   NA 139 03/12/2022   K 4.2 03/12/2022   CL 106 03/12/2022   CO2 27  03/12/2022   Lab Results  Component Value Date   ALT 19 03/12/2022   AST 17 03/12/2022   ALKPHOS 30 (L) 03/12/2022   BILITOT 0.6 03/12/2022   Lab Results  Component Value Date   HGBA1C 5.6 05/16/2022   Lab Results  Component Value Date   INSULIN 9.5 05/16/2022   Lab Results  Component Value Date   TSH 2.45 03/12/2022   Lab Results  Component Value Date   CHOL 145 03/12/2022   HDL 37.30 (L) 03/12/2022   LDLCALC 85 03/12/2022   TRIG 113.0 03/12/2022   CHOLHDL 4 03/12/2022   Lab Results  Component Value Date   WBC 5.8 03/12/2022   HGB 13.1 03/12/2022   HCT 40.2 03/12/2022   MCV 91.8 03/12/2022   PLT 415.0 (H) 03/12/2022   No results found for: IRON, TIBC, FERRITIN  Attestation  Statements:   Reviewed by clinician on day of visit: allergies, medications, problem list, medical history, surgical history, family history, social history, and previous encounter notes.  Time spent on visit including pre-visit chart review and post-visit charting and care was 45 minutes.   I, Jackson LatinoAlisa White, RMA, am acting as Energy managertranscriptionist for Reuben LikesAlexandria Eliyah Bazzi, MD.  This is the patient's first visit at [facility]. The patient's NEW PATIENT PACKET was reviewed at length. Included in the packet: current and past health history, medications, allergies, ROS, gynecologic history (women only), surgical history, family history, social history, weight history, weight loss surgery history (for those that have had weight loss surgery), nutritional evaluation, mood and food questionnaire, PHQ9, Epworth questionnaire, sleep habits questionnaire, patient life and health improvement goals questionnaire. These will all be scanned into the patient's chart under media.   During the visit, I independently reviewed the patient's EKG, bioimpedance scale results, and indirect calorimeter results. I used this information to tailor a meal plan for the patient that will help her to lose weight and will improve her obesity-related conditions going forward. I performed a medically necessary appropriate examination and/or evaluation. I discussed the assessment and treatment plan with the patient. The patient was provided an opportunity to ask questions and all were answered. The patient agreed with the plan and demonstrated an understanding of the instructions. Labs were ordered at this visit and will be reviewed at the next visit unless more critical results need to be addressed immediately. Clinical information was updated and documented in the EMR.   Total time spent: 45 minutes  I have reviewed the above documentation for accuracy and completeness, and I agree with the above. - Reuben LikesAlexandria Shaquoya Cosper, MD

## 2022-05-30 ENCOUNTER — Telehealth (INDEPENDENT_AMBULATORY_CARE_PROVIDER_SITE_OTHER): Payer: Self-pay | Admitting: Family Medicine

## 2022-05-30 ENCOUNTER — Encounter (INDEPENDENT_AMBULATORY_CARE_PROVIDER_SITE_OTHER): Payer: Self-pay

## 2022-05-30 ENCOUNTER — Ambulatory Visit (INDEPENDENT_AMBULATORY_CARE_PROVIDER_SITE_OTHER): Payer: Medicaid Other | Admitting: Family Medicine

## 2022-05-30 ENCOUNTER — Encounter (INDEPENDENT_AMBULATORY_CARE_PROVIDER_SITE_OTHER): Payer: Self-pay | Admitting: Family Medicine

## 2022-05-30 VITALS — BP 127/80 | HR 66 | Temp 98.9°F | Ht 66.0 in | Wt 236.0 lb

## 2022-05-30 DIAGNOSIS — E8881 Metabolic syndrome: Secondary | ICD-10-CM | POA: Diagnosis not present

## 2022-05-30 DIAGNOSIS — Z6838 Body mass index (BMI) 38.0-38.9, adult: Secondary | ICD-10-CM | POA: Diagnosis not present

## 2022-05-30 DIAGNOSIS — E669 Obesity, unspecified: Secondary | ICD-10-CM

## 2022-05-30 DIAGNOSIS — E786 Lipoprotein deficiency: Secondary | ICD-10-CM | POA: Diagnosis not present

## 2022-05-30 DIAGNOSIS — E559 Vitamin D deficiency, unspecified: Secondary | ICD-10-CM

## 2022-05-30 MED ORDER — BD PEN NEEDLE NANO 2ND GEN 32G X 4 MM MISC: 1.0000 | Freq: Two times a day (BID) | 0 refills | Status: DC

## 2022-05-30 MED ORDER — VICTOZA 18 MG/3ML ~~LOC~~ SOPN: 0.6000 mg | PEN_INJECTOR | Freq: Every day | SUBCUTANEOUS | 0 refills | Status: DC

## 2022-05-30 MED ORDER — VITAMIN D (ERGOCALCIFEROL) 1.25 MG (50000 UNIT) PO CAPS: 50000.0000 [IU] | ORAL_CAPSULE | ORAL | 0 refills | Status: DC

## 2022-05-30 NOTE — Telephone Encounter (Signed)
Please check PA for Victoza, pt already left a message regarding PA and is aware it could take 2 weeks. Thanks

## 2022-05-30 NOTE — Telephone Encounter (Signed)
Not sure if you got this or not, please contact pt, Thanks

## 2022-05-30 NOTE — Telephone Encounter (Signed)
Pt just left appt but can not remember the name of the inj provider was recommending. The best call back number is [phone].   Appt 6/7 with Dr. Marquis Lunch

## 2022-05-30 NOTE — Telephone Encounter (Signed)
Patient sent a message via Earleen Reaper, requesting a current copy of insurance card. I am unable to process prior authorization without a current copy of insurance card. No insurance card has been scanned into Epic.

## 2022-05-31 ENCOUNTER — Encounter (INDEPENDENT_AMBULATORY_CARE_PROVIDER_SITE_OTHER): Payer: Self-pay

## 2022-05-31 ENCOUNTER — Telehealth (INDEPENDENT_AMBULATORY_CARE_PROVIDER_SITE_OTHER): Payer: Self-pay | Admitting: Family Medicine

## 2022-05-31 NOTE — Telephone Encounter (Signed)
Dr. Lawson Radar - Prior authorization was denied for Victoza. Per insurance: Patient does not have type 2 diabetes. Patient sent denial message via mychart.

## 2022-06-01 NOTE — Progress Notes (Signed)
Chief Complaint:   OBESITY Valerie Robbins is here to discuss her progress with her obesity treatment plan along with follow-up of her obesity related diagnoses. Valerie Robbins is on the Category 3 Plan and keeping a food journal and adhering to recommended goals of 350-500 and 450-600 calories and 30 and 40 protein and states she is following her eating plan approximately 100% of the time. Valerie Robbins states she is going to the gym 60 minutes 4-3 times per week.  Today's visit was #: 2 Starting weight: 239 lbs Starting date: 05/16/2022 Today's weight: 236 lbs Today's date: 05/30/2022 Total lbs lost to date: 3 lbs Total lbs lost since last in-office visit: 3  Interim History: Valerie Robbins is feeling discouraged; wanted to see more change on scale. She does feel both hunger and cravings. She is going by calorie and protein handouts. She does use protein powder to make a shake during the day. Breakfast is McD[employer] Egg McMuffin, Chipotle is lunch with rice in bowl and supper is baked chicken pr salmon. She's not necessarily weighing protein.  Subjective:   1. Vitamin D deficiency Valerie Robbins is not taking Vit D. Her last Vit D level of 27.3. Denies any nausea, vomiting or muscle weakness. She notes fatigue.  2. Low HDL (under 40) Valerie Robbins's last HDL at 37, LDL and Trig within normal limits and controlled. She started a significant amount of exercise recently.  3. Insulin resistance Valerie Robbins's last A1c at 5.6, insulin at 9.5. No history of thyroid cancer.  Assessment/Plan:   1. Vitamin D deficiency We will refill Vit D 50,000 IU once a week for 1 month with 0 refills.  -Refill Vitamin D, Ergocalciferol, (DRISDOL) 1.25 MG (50000 UNIT) CAPS capsule; Take 1 capsule (50,000 Units total) by mouth every 7 (seven) days.  Dispense: 4 capsule; Refill: 0  2. Low HDL (under 40) Valerie Robbins encouraged to continue good amount of exercise.  3. Insulin resistance Start Victoza 0.6 mg subcutaneous daily and insulin pen  needles.Side effects discussed in depth. Valerie Robbins educated on administration.  Fill for 1 month with 0 refills.  -START liraglutide (VICTOZA) 18 MG/3ML SOPN; Inject 0.6 mg into the skin daily.  Dispense: 3 mL; Refill: 0  -Fill Insulin Pen Needle (BD PEN NEEDLE NANO 2ND GEN) 32G X 4 MM MISC; 1 Package by Does not apply route 2 (two) times daily.  Dispense: 100 each; Refill: 0  4. Obesity with current bMI of 38.2 Valerie Robbins is currently in the action stage of change. As such, her goal is to continue with weight loss efforts. She has agreed to the Category 3 Plan and keeping a food journal and adhering to recommended goals of 350-500 and 450-600 at lunch & supper calories and 30+ grams for lunch and 40+ grams protein at supper.  Exercise goals: All adults should avoid inactivity. Some physical activity is better than none, and adults who participate in any amount of physical activity gain some health benefits.  Behavioral modification strategies: increasing lean protein intake, decreasing eating out, meal planning and cooking strategies, and avoiding temptations.  Valerie Robbins has agreed to follow-up with our clinic in 2 weeks. She was informed of the importance of frequent follow-up visits to maximize her success with intensive lifestyle modifications for her multiple health conditions.   Objective:   Blood pressure 127/80, pulse 66, temperature 98.9 F (37.2 C), height 5\' 6"  (1.676 m), weight 236 lb (107 kg), last menstrual period 05/27/2022, SpO2 99 %. Body mass index is 38.09 kg/m.  General: Cooperative, alert,  well developed, in no acute distress. HEENT: Conjunctivae and lids unremarkable. Cardiovascular: Regular rhythm.  Lungs: Normal work of breathing. Neurologic: No focal deficits.   Lab Results  Component Value Date   CREATININE 1.01 03/12/2022   BUN 17 03/12/2022   NA 139 03/12/2022   K 4.2 03/12/2022   CL 106 03/12/2022   CO2 27 03/12/2022   Lab Results  Component Value Date    ALT 19 03/12/2022   AST 17 03/12/2022   ALKPHOS 30 (L) 03/12/2022   BILITOT 0.6 03/12/2022   Lab Results  Component Value Date   HGBA1C 5.6 05/16/2022   Lab Results  Component Value Date   INSULIN 9.5 05/16/2022   Lab Results  Component Value Date   TSH 2.45 03/12/2022   Lab Results  Component Value Date   CHOL 145 03/12/2022   HDL 37.30 (L) 03/12/2022   LDLCALC 85 03/12/2022   TRIG 113.0 03/12/2022   CHOLHDL 4 03/12/2022   Lab Results  Component Value Date   VD25OH 27.3 (L) 05/16/2022   Lab Results  Component Value Date   WBC 5.8 03/12/2022   HGB 13.1 03/12/2022   HCT 40.2 03/12/2022   MCV 91.8 03/12/2022   PLT 415.0 (H) 03/12/2022   No results found for: "IRON", "TIBC", "FERRITIN"  Attestation Statements:   Reviewed by clinician on day of visit: allergies, medications, problem list, medical history, surgical history, family history, social history, and previous encounter notes.  Time spent on visit including pre-visit chart review and post-visit care and charting was 45 minutes.   I, Fortino Sic, RMA am acting as transcriptionist for Reuben Likes, MD.  I have reviewed the above documentation for accuracy and completeness, and I agree with the above. - Reuben Likes, MD

## 2022-06-06 ENCOUNTER — Ambulatory Visit (INDEPENDENT_AMBULATORY_CARE_PROVIDER_SITE_OTHER): Payer: Medicaid Other | Admitting: Bariatrics

## 2022-06-06 NOTE — Telephone Encounter (Signed)
Dr. Lawson Radar - Patient was denied Victoza. Per insurance: Patient does not have type 2 diabetes. Patient is requesting code be change to get approval. Patient was advised it was denied, due to not having type 2 diabetes.

## 2022-06-14 ENCOUNTER — Ambulatory Visit (INDEPENDENT_AMBULATORY_CARE_PROVIDER_SITE_OTHER): Payer: Medicaid Other | Admitting: Family Medicine

## 2022-06-27 ENCOUNTER — Encounter (INDEPENDENT_AMBULATORY_CARE_PROVIDER_SITE_OTHER): Payer: Self-pay | Admitting: Family Medicine

## 2022-06-27 ENCOUNTER — Ambulatory Visit (INDEPENDENT_AMBULATORY_CARE_PROVIDER_SITE_OTHER): Payer: Medicaid Other | Admitting: Family Medicine

## 2022-06-27 VITALS — BP 137/83 | HR 65 | Temp 98.3°F | Ht 66.0 in | Wt 240.0 lb

## 2022-06-27 DIAGNOSIS — Z6838 Body mass index (BMI) 38.0-38.9, adult: Secondary | ICD-10-CM

## 2022-06-27 DIAGNOSIS — E669 Obesity, unspecified: Secondary | ICD-10-CM

## 2022-06-27 DIAGNOSIS — R632 Polyphagia: Secondary | ICD-10-CM | POA: Diagnosis not present

## 2022-06-27 DIAGNOSIS — E559 Vitamin D deficiency, unspecified: Secondary | ICD-10-CM

## 2022-06-27 MED ORDER — TOPIRAMATE 25 MG PO TABS: 25.0000 mg | ORAL_TABLET | Freq: Two times a day (BID) | ORAL | 0 refills | Status: DC

## 2022-06-27 MED ORDER — VITAMIN D (ERGOCALCIFEROL) 1.25 MG (50000 UNIT) PO CAPS: 50000.0000 [IU] | ORAL_CAPSULE | ORAL | 0 refills | Status: DC

## 2022-06-27 MED ORDER — LOMAIRA 8 MG PO TABS: 8.0000 mg | ORAL_TABLET | Freq: Every day | ORAL | 0 refills | Status: DC

## 2022-06-28 NOTE — Progress Notes (Signed)
Chief Complaint:   OBESITY Valerie Robbins is here to discuss her progress with her obesity treatment plan along with follow-up of her obesity related diagnoses. Valerie Robbins is on the Category 3 Plan and states she is following her eating plan approximately 100% of the time. Valerie Robbins states she is going to gym and walking 60/15-30 minutes 3-5 times per week.  Today's visit was #: 3 Starting weight: 239 lbs Starting date: 05/16/2022 Today's weight: 240 lbs Today's date: 06/27/2022 Total lbs lost to date: 0 lbs Total lbs lost since last in-office visit: 0  Interim History: Valerie Robbins has more stress recently in her life with her grandmother entering Hospice. She did notice she will occasionally drink wine but has stopped due to calorie intake. Able to get all food in. She does not feel any meal plan changes need to be made.  Subjective:   1. Polyphagia Victoza has been denied by her insurance. Valerie Robbins is following meal plan. PDMP (prescription drug monitoring program) signed.  2. Vitamin D deficiency Valerie Robbins is currently taking prescription Vit D 50,000 IU once a week. Her last Vit D level of 27.3. Denies any nausea, vomiting or muscle weakness. She notes fatigue.  Assessment/Plan:   1. Polyphagia Start Lomaira 8 mg by mouth daily for 1 month with 0 refills. Start Topamax 25 mg by mouth daily for 1 month with 0 refills.  -Start Phentermine HCl (LOMAIRA) 8 MG TABS; Take 8 mg by mouth daily.  Dispense: 30 tablet; Refill: 0  -Start topiramate (TOPAMAX) 25 MG tablet; Take 1 tablet (25 mg total) by mouth 2 (two) times daily.  Dispense: 45 tablet; Refill: 0  2. Vitamin D deficiency We will refill Vit D 50,000 IU once a week for 1 month with 0 refills.  -Refill Vitamin D, Ergocalciferol, (DRISDOL) 1.25 MG (50000 UNIT) CAPS capsule; Take 1 capsule (50,000 Units total) by mouth every 7 (seven) days.  Dispense: 4 capsule; Refill: 0  3. Obesity with current bMI of 38.9 Valerie Robbins is currently in the  action stage of change. As such, her goal is to continue with weight loss efforts. She has agreed to the Category 3 Plan.   Exercise goals: As is.  Behavioral modification strategies: increasing lean protein intake, meal planning and cooking strategies, keeping healthy foods in the home, and planning for success.  Valerie Robbins has agreed to follow-up with our clinic in 3 weeks. She was informed of the importance of frequent follow-up visits to maximize her success with intensive lifestyle modifications for her multiple health conditions.   Objective:   Blood pressure 137/83, pulse 65, temperature 98.3 F (36.8 C), height 5\' 6"  (1.676 m), weight 240 lb (108.9 kg), last menstrual period 05/27/2022, SpO2 100 %. Body mass index is 38.74 kg/m.  General: Cooperative, alert, well developed, in no acute distress. HEENT: Conjunctivae and lids unremarkable. Cardiovascular: Regular rhythm.  Lungs: Normal work of breathing. Neurologic: No focal deficits.   Lab Results  Component Value Date   CREATININE 1.01 03/12/2022   BUN 17 03/12/2022   NA 139 03/12/2022   K 4.2 03/12/2022   CL 106 03/12/2022   CO2 27 03/12/2022   Lab Results  Component Value Date   ALT 19 03/12/2022   AST 17 03/12/2022   ALKPHOS 30 (L) 03/12/2022   BILITOT 0.6 03/12/2022   Lab Results  Component Value Date   HGBA1C 5.6 05/16/2022   Lab Results  Component Value Date   INSULIN 9.5 05/16/2022   Lab Results  Component Value  Date   TSH 2.45 03/12/2022   Lab Results  Component Value Date   CHOL 145 03/12/2022   HDL 37.30 (L) 03/12/2022   LDLCALC 85 03/12/2022   TRIG 113.0 03/12/2022   CHOLHDL 4 03/12/2022   Lab Results  Component Value Date   VD25OH 27.3 (L) 05/16/2022   Lab Results  Component Value Date   WBC 5.8 03/12/2022   HGB 13.1 03/12/2022   HCT 40.2 03/12/2022   MCV 91.8 03/12/2022   PLT 415.0 (H) 03/12/2022   No results found for: "IRON", "TIBC", "FERRITIN"  Attestation Statements:    Reviewed by clinician on day of visit: allergies, medications, problem list, medical history, surgical history, family history, social history, and previous encounter notes.  I, Fortino Sic, RMA am acting as transcriptionist for Reuben Likes, MD.  I have reviewed the above documentation for accuracy and completeness, and I agree with the above. - Reuben Likes, MD

## 2022-07-23 ENCOUNTER — Ambulatory Visit (INDEPENDENT_AMBULATORY_CARE_PROVIDER_SITE_OTHER): Payer: Medicaid Other | Admitting: Family Medicine

## 2022-08-01 ENCOUNTER — Encounter (INDEPENDENT_AMBULATORY_CARE_PROVIDER_SITE_OTHER): Payer: Self-pay

## 2022-11-26 ENCOUNTER — Other Ambulatory Visit: Payer: Self-pay

## 2022-11-26 MED ORDER — LORAZEPAM 1 MG PO TABS: ORAL_TABLET | ORAL | 0 refills | Status: DC

## 2022-11-26 NOTE — Telephone Encounter (Signed)
Requesting: Lorazepam Contract: 01/23/21 UDS: n/a Last Visit: 03/12/22 Next Visit: 6 mo f/u [facility] Last Refill: 03/12/22(60,1)   Please Advise, med pending

## 2022-11-26 NOTE — Telephone Encounter (Signed)
#  30 prescribed. Patient due for follow-up.

## 2022-11-27 NOTE — Telephone Encounter (Signed)
LM for pt regarding medication ?

## 2023-01-08 ENCOUNTER — Other Ambulatory Visit: Payer: Self-pay

## 2023-01-09 ENCOUNTER — Telehealth (INDEPENDENT_AMBULATORY_CARE_PROVIDER_SITE_OTHER): Payer: Medicaid Other | Admitting: Family Medicine

## 2023-01-09 ENCOUNTER — Encounter: Payer: Self-pay | Admitting: Family Medicine

## 2023-01-09 DIAGNOSIS — F4321 Adjustment disorder with depressed mood: Secondary | ICD-10-CM | POA: Diagnosis not present

## 2023-01-09 DIAGNOSIS — F5105 Insomnia due to other mental disorder: Secondary | ICD-10-CM

## 2023-01-09 DIAGNOSIS — F411 Generalized anxiety disorder: Secondary | ICD-10-CM | POA: Diagnosis not present

## 2023-01-09 DIAGNOSIS — F99 Mental disorder, not otherwise specified: Secondary | ICD-10-CM

## 2023-01-09 DIAGNOSIS — F331 Major depressive disorder, recurrent, moderate: Secondary | ICD-10-CM

## 2023-01-09 MED ORDER — LORAZEPAM 1 MG PO TABS: ORAL_TABLET | ORAL | 0 refills | Status: DC

## 2023-01-09 NOTE — Progress Notes (Signed)
Virtual Visit via Video Note  I connected with Valerie Robbins  on 01/09/23 at  8:20 AM EST by a video enabled telemedicine application and verified that I am speaking with the correct person using two identifiers.  Location patient: [place] Location provider:work or home office Persons participating in the virtual visit: patient, provider  I discussed the limitations and requested verbal permission for telemedicine visit. The patient expressed understanding and agreed to proceed.  CC: 37 year old female being seen today to f/u anx/dep, discuss medications. When I last saw her back in March 2023 we continued her on fluoxetine 20 mg a day, lorazepam twice daily as needed, and I referred her to a counselor. Also referred her to healthy weight and wellness clinic.  INTERIM HX: Her grandmother, whom she had been caring for the last 5 years finally passed away 2023/11/26. Understandably, Valerie Robbins is struggling with depression/grief lately.  Anxious, overwhelmed.  Her sons are really struggling as well.  This has her even more anxious and worried. In the fall she had not been taking her lorazepam very regularly but since her grandmother's death she has been needing it twice a day, also occasionally taking an extra tab at bedtime because of insomnia.  Her mind will not shut down. No alcohol or drug use.  She has not been working due to her grief. She currently does not see a Social worker.   PMP AWARE reviewed today: most recent rx for lorazepam was filled 12/23/2022, # 30, rx by me. Prior to that the most recent lorazepam prescription was filled 08/28/2022, #60, prescription by me. No red flags.  ROS: See pertinent positives and negatives per HPI.  Past Medical History:  Diagnosis Date   Abnormal Pap smear    Anxiety and depression    Asthma    childhood   Atypical chest pain 09/01/2016   ED visit: +chest wall TTP.  D-dimer neg, TnI neg, CXR neg, EKG nl, no hypoxia.   Back pain    Bilateral swelling of  feet    Family history of congenital hearing loss    Audiology eval at [facility] normal.   History of uterine fibroid    Joint pain    Metrorrhagia    with pelvic pain.  Transvag+transabd pelvic u/s NORMAL 05/2014.   Peripheral edema    bilat LL's: suspected venous insuff 06/2019; low dose prn lasix rx'd.   Recurrent genital herpes    HSV 2   SOB (shortness of breath)     Past Surgical History:  Procedure Laterality Date   COLPOSCOPY W/ BIOPSY / CURETTAGE     needs annual paps     Current Outpatient Medications:    FLUoxetine (PROZAC) 20 MG capsule, Take 1 capsule (20 mg total) by mouth daily., Disp: 90 capsule, Rfl: 3   LORazepam (ATIVAN) 1 MG tablet, 1-2 tabs po bid prn severe anxiety, Disp: 30 tablet, Rfl: 0   Multiple Vitamins-Minerals (MULTIVITAMIN WOMEN PO), Take by mouth. One a day Women's, Disp: , Rfl:    topiramate (TOPAMAX) 25 MG tablet, Take 1 tablet (25 mg total) by mouth 2 (two) times daily. (Patient not taking: Reported on 01/09/2023), Disp: 45 tablet, Rfl: 0   Vitamin D, Ergocalciferol, (DRISDOL) 1.25 MG (50000 UNIT) CAPS capsule, Take 1 capsule (50,000 Units total) by mouth every 7 (seven) days. (Patient not taking: Reported on 01/09/2023), Disp: 4 capsule, Rfl: 0  EXAM:  VITALS per patient if applicable:     04/02/4495   11:00 AM 05/30/2022  10:00 AM 05/16/2022    9:00 AM  Vitals with BMI  Height 5\' 6"  5\' 6"  5\' 6"   Weight 240 lbs 236 lbs 239 lbs  BMI 38.76 40.10 27.25  Systolic 366 440 347  Diastolic 83 80 74  Pulse 65 66 65     GENERAL: alert, oriented, appears well and in no acute distress  HEENT: atraumatic, conjunttiva clear, no obvious abnormalities on inspection of external nose and ears  NECK: normal movements of the head and neck  LUNGS: on inspection no signs of respiratory distress, breathing rate appears normal, no obvious gross SOB, gasping or wheezing  CV: no obvious cyanosis  MS: moves all visible extremities without noticeable  abnormality  PSYCH/NEURO: pleasant and cooperative, no obvious depression or anxiety, speech and thought processing grossly intact  LABS: none today   Chemistry      Component Value Date/Time   NA 139 03/12/2022 0829   K 4.2 03/12/2022 0829   CL 106 03/12/2022 0829   CO2 27 03/12/2022 0829   BUN 17 03/12/2022 0829   CREATININE 1.01 03/12/2022 0829      Component Value Date/Time   CALCIUM 8.8 03/12/2022 0829   ALKPHOS 30 (L) 03/12/2022 0829   AST 17 03/12/2022 0829   ALT 19 03/12/2022 0829   BILITOT 0.6 03/12/2022 0829     Lab Results  Component Value Date   HGBA1C 5.6 05/16/2022   ASSESSMENT AND PLAN:  Discussed the following assessment and plan:  Grief response. History of recurrent major depressive disorder, moderate, active. GAD. 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  She says fluoxetine 20 mg has been an excellent medication for her. Okay to temporarily take more lorazepam, as this seems to calm her down and allow her to cope and take care of her kids better in this situation. Prescription for lorazepam 1 mg, 1-2 tabs 3 times a day as needed for anxiety and insomnia.  #120, no refill. Referral to behavioral health today.   I discussed the assessment and treatment plan with the patient. The patient was provided an opportunity to ask questions and all were answered. The patient agreed with the plan and demonstrated an understanding of the instructions.   F/u: 1 mo  Signed:  Crissie Sickles, MD           01/09/2023

## 2023-01-22 DIAGNOSIS — H5213 Myopia, bilateral: Secondary | ICD-10-CM | POA: Diagnosis not present

## 2023-03-04 DIAGNOSIS — H16252 Phlyctenular keratoconjunctivitis, left eye: Secondary | ICD-10-CM | POA: Diagnosis not present

## 2023-03-19 ENCOUNTER — Other Ambulatory Visit: Payer: Self-pay | Admitting: Obstetrics

## 2023-03-20 ENCOUNTER — Other Ambulatory Visit: Payer: Self-pay

## 2023-03-20 MED ORDER — FLUOXETINE HCL 20 MG PO CAPS: 20.0000 mg | ORAL_CAPSULE | Freq: Every day | ORAL | 0 refills | Status: DC

## 2023-03-25 ENCOUNTER — Encounter: Payer: Self-pay | Admitting: Family Medicine

## 2023-03-25 ENCOUNTER — Ambulatory Visit (INDEPENDENT_AMBULATORY_CARE_PROVIDER_SITE_OTHER): Payer: Medicaid Other | Admitting: Family Medicine

## 2023-03-25 VITALS — BP 123/86 | HR 72 | Wt 193.4 lb

## 2023-03-25 DIAGNOSIS — L819 Disorder of pigmentation, unspecified: Secondary | ICD-10-CM

## 2023-03-25 DIAGNOSIS — M542 Cervicalgia: Secondary | ICD-10-CM

## 2023-03-25 MED ORDER — CYCLOBENZAPRINE HCL 10 MG PO TABS: 10.0000 mg | ORAL_TABLET | Freq: Three times a day (TID) | ORAL | 0 refills | Status: DC | PRN

## 2023-03-25 NOTE — Progress Notes (Signed)
OFFICE VISIT  03/25/2023  CC:  Chief Complaint  Patient presents with   Follow-up    Concerned about a mole on her left leg. She wants to discuss something for neck and shoulder pain.   Patient is a 37 y.o. female who presents for left leg concern.  HPI: For the last couple of months Valerie Robbins has noted a pigmented spot on the left thigh that has become more palpable. Says that she thinks this is a place where she had a mole removal many years ago.  She does not remember it being palpable like it is now.  Also, lots of stress recently due to the death of her mother last year.  Seems to be coping appropriately from an emotional/mental standpoint but does have excessive tension in her neck muscles diffusely.  Flexeril has helped this for her in the past.  Past Medical History:  Diagnosis Date   Abnormal Pap smear    Anxiety and depression    Asthma    childhood   Atypical chest pain 09/01/2016   ED visit: +chest wall TTP.  D-dimer neg, TnI neg, CXR neg, EKG nl, no hypoxia.   Back pain    Bilateral swelling of feet    Family history of congenital hearing loss    Audiology eval at [facility] normal.   History of uterine fibroid    Joint pain    Metrorrhagia    with pelvic pain.  Transvag+transabd pelvic u/s NORMAL 05/2014.   Peripheral edema    bilat LL's: suspected venous insuff 06/2019; low dose prn lasix rx'd.   Recurrent genital herpes    HSV 2   SOB (shortness of breath)     Past Surgical History:  Procedure Laterality Date   COLPOSCOPY W/ BIOPSY / CURETTAGE     needs annual paps    Outpatient Medications Prior to Visit  Medication Sig Dispense Refill   FLUoxetine (PROZAC) 20 MG capsule Take 1 capsule (20 mg total) by mouth daily. APPT NEEDED 30 capsule 0   LORazepam (ATIVAN) 1 MG tablet 1-2 tabs 3 times a day as needed for severe anxiety 120 tablet 0   Multiple Vitamins-Minerals (MULTIVITAMIN WOMEN PO) Take by mouth. One a day Women's     topiramate (TOPAMAX) 25 MG tablet  Take 1 tablet (25 mg total) by mouth 2 (two) times daily. (Patient not taking: Reported on 01/09/2023) 45 tablet 0   Vitamin D, Ergocalciferol, (DRISDOL) 1.25 MG (50000 UNIT) CAPS capsule Take 1 capsule (50,000 Units total) by mouth every 7 (seven) days. (Patient not taking: Reported on 01/09/2023) 4 capsule 0   No facility-administered medications prior to visit.    No Known Allergies  Review of Systems  As per HPI  PE:    03/25/2023   11:13 AM 06/27/2022   11:00 AM 05/30/2022   10:00 AM  Vitals with BMI  Height  5\' 6"  5\' 6"   Weight 193 lbs 6 oz 240 lbs 236 lbs  BMI  [id number]  Systolic [id number]  Diastolic 86 83 80  Pulse 72 65 66     Physical Exam  Gen: Alert, well appearing.  Patient is oriented to person, place, time, and situation. AFFECT: pleasant, lucid thought and speech. ROM of neck intact.  No tenderness. Left thigh with pigmented oval macule that is about a centimeter in diameter, small palpable papular lesion in the periphery of it.  LABS:  Last metabolic panel Lab Results  Component Value Date   GLUCOSE  106 (H) 03/12/2022   NA 139 03/12/2022   K 4.2 03/12/2022   CL 106 03/12/2022   CO2 27 03/12/2022   BUN 17 03/12/2022   CREATININE 1.01 03/12/2022   GFRNONAA >60 09/01/2016   CALCIUM 8.8 03/12/2022   PROT 6.9 03/12/2022   ALBUMIN 4.2 03/12/2022   BILITOT 0.6 03/12/2022   ALKPHOS 30 (L) 03/12/2022   AST 17 03/12/2022   ALT 19 03/12/2022   ANIONGAP 6 09/01/2016   IMPRESSION AND PLAN:  #1 pigmented skin lesion, left thigh.  Recent change. Refer to dermatology.  2.  Muscle tension and stress, cervical region. Flexeril 10 mg 3 times daily as needed, #30, no refill.  An After Visit Summary was printed and given to the patient.  FOLLOW UP: Return for as needed.  Signed:  Crissie Sickles, MD           03/25/2023

## 2023-04-24 ENCOUNTER — Encounter: Payer: Self-pay | Admitting: Dermatology

## 2023-04-24 ENCOUNTER — Ambulatory Visit (INDEPENDENT_AMBULATORY_CARE_PROVIDER_SITE_OTHER): Payer: Medicaid Other | Admitting: Dermatology

## 2023-04-24 VITALS — BP 110/71

## 2023-04-24 DIAGNOSIS — D492 Neoplasm of unspecified behavior of bone, soft tissue, and skin: Secondary | ICD-10-CM

## 2023-04-24 DIAGNOSIS — D1801 Hemangioma of skin and subcutaneous tissue: Secondary | ICD-10-CM | POA: Diagnosis not present

## 2023-04-24 NOTE — Progress Notes (Signed)
   New Patient Visit   Subjective  Valerie Robbins is a 37 y.o. female who presents for the following:  a growth on the left thigh x 30 years. It was removed when she was a child possibly by freezing it. She is unaware of the diagnosis. There is a bump that came out about 4 months. It does not itch. No personal or family history of skin cancer   The following portions of the chart were reviewed this encounter and updated as appropriate: medications, allergies, medical history  Review of Systems:  No other skin or systemic complaints except as noted in HPI or Assessment and Plan.  Objective  Well appearing patient in no apparent distress; mood and affect are within normal limits.   A focused examination was performed of the following areas: Left leg  Relevant exam findings are noted in the Assessment and Plan.  Left Thigh - Posterior 1cm papule       Assessment & Plan      Neoplasm of skin Left Thigh - Posterior  Skin / nail biopsy Type of biopsy: tangential   Informed consent: discussed and consent obtained   Timeout: patient name, date of birth, surgical site, and procedure verified   Procedure prep:  Patient was prepped and draped in usual sterile fashion Prep type:  Isopropyl alcohol Anesthesia: the lesion was anesthetized in a standard fashion   Anesthetic:  1% lidocaine w/ epinephrine 1-100,000 buffered w/ 8.4% NaHCO3 Instrument used: DermaBlade   Hemostasis achieved with: aluminum chloride   Outcome: patient tolerated procedure well   Post-procedure details: sterile dressing applied and wound care instructions given   Dressing type: petrolatum gauze and bandage      Return if symptoms worsen or fail to improve.  Jaclynn Guarneri, CMA, am acting as scribe for Langston Reusing, MD.   Documentation: I have reviewed the above documentation for accuracy and completeness, and I agree with the above.  Langston Reusing, MD

## 2023-04-24 NOTE — Patient Instructions (Addendum)
Due to recent changes in healthcare laws, you may see results of your pathology and/or laboratory studies on MyChart before the doctors have had a chance to review them. We understand that in some cases there may be results that are confusing or concerning to you. Please understand that not all results are received at the same time and often the doctors may need to interpret multiple results in order to provide you with the best plan of care or course of treatment. Therefore, we ask that you please give us 2 business days to thoroughly review all your results before contacting the office for clarification. Should we see a critical lab result, you will be contacted sooner.   If You Need Anything After Your Visit  If you have any questions or concerns for your doctor, please call our main line at [phone] If no one answers, please leave a voicemail as directed and we will return your call as soon as possible. Messages left after 4 pm will be answered the following business day.   You may also send us a message via MyChart. We typically respond to MyChart messages within 1-2 business days.  For prescription refills, please ask your pharmacy to contact our office. Our fax number is [phone].  If you have an urgent issue when the clinic is closed that cannot wait until the next business day, you can page your doctor at the number below.    Please note that while we do our best to be available for urgent issues outside of office hours, we are not available 24/7.   If you have an urgent issue and are unable to reach us, you may choose to seek medical care at your doctor's office, retail clinic, urgent care center, or emergency room.  If you have a medical emergency, please immediately call 911 or go to the emergency department. In the event of inclement weather, please call our main line at [phone] for an update on the status of any delays or closures.  Dermatology Medication Tips: Please  keep the boxes that topical medications come in in order to help keep track of the instructions about where and how to use these. Pharmacies typically print the medication instructions only on the boxes and not directly on the medication tubes.   If your medication is too expensive, please contact our office at [phone] or send us a message through MyChart.   We are unable to tell what your co-pay for medications will be in advance as this is different depending on your insurance coverage. However, we may be able to find a substitute medication at lower cost or fill out paperwork to get insurance to cover a needed medication.   If a prior authorization is required to get your medication covered by your insurance company, please allow us 1-2 business days to complete this process.  Drug prices often vary depending on where the prescription is filled and some pharmacies may offer cheaper prices.  The website www.goodrx.com contains coupons for medications through different pharmacies. The prices here do not account for what the cost may be with help from insurance (it may be cheaper with your insurance), but the website can give you the price if you did not use any insurance.  - You can print the associated coupon and take it with your prescription to the pharmacy.  - You may also stop by our office during regular business hours and pick up a GoodRx coupon card.  - If you need your   prescription sent electronically to a different pharmacy, notify our office through [place] MyChart or by phone at [phone]    Patient Handout: Wound Care for Skin Biopsy Site  Patient Handout: Wound Care for Skin Biopsy Site  Taking Care of Your Skin Biopsy Site  Proper care of the biopsy site is essential for promoting healing and minimizing scarring. This handout provides instructions on how to care for your biopsy site to ensure optimal recovery.  1. Cleaning the Wound:  Clean the biopsy site daily  with gentle soap and water. Gently pat the area dry with a clean, soft towel. Avoid harsh scrubbing or rubbing the area, as this can irritate the skin and delay healing.  2. Applying Aquaphor and Bandage:  After cleaning the wound, apply a thin layer of Aquaphor ointment to the biopsy site. Cover the area with a sterile bandage to protect it from dirt, bacteria, and friction. Change the bandage daily or as needed if it becomes soiled or wet.  3. Continued Care for One Week:  Repeat the cleaning, Aquaphor application, and bandaging process daily for one week following the biopsy procedure. Keeping the wound clean and moist during this initial healing period will help prevent infection and promote optimal healing.  4. Massaging Aquaphor into the Area:  ---After one week, discontinue the use of bandages but continue to apply Aquaphor to the biopsy site. ----Gently massage the Aquaphor into the area using circular motions. ---Massaging the skin helps to promote circulation and prevent the formation of scar tissue.   Additional Tips:  Avoid exposing the biopsy site to direct sunlight during the healing process, as this can cause hyperpigmentation or worsen scarring. If you experience any signs of infection, such as increased redness, swelling, warmth, or drainage from the wound, contact your healthcare provider immediately. Follow any additional instructions provided by your healthcare provider for caring for the biopsy site and managing any discomfort. Conclusion:  Taking proper care of your skin biopsy site is crucial for ensuring optimal healing and minimizing scarring. By following these instructions for cleaning, applying Aquaphor, and massaging the area, you can promote a smooth and successful recovery. If you have any questions or concerns about caring for your biopsy site, don't hesitate to contact your healthcare provider for guidance.   

## 2023-04-26 ENCOUNTER — Other Ambulatory Visit: Payer: Self-pay | Admitting: Family Medicine

## 2023-04-26 MED ORDER — FLUOXETINE HCL 20 MG PO CAPS: 20.0000 mg | ORAL_CAPSULE | Freq: Every day | ORAL | 0 refills | Status: DC

## 2023-04-26 MED ORDER — CYCLOBENZAPRINE HCL 10 MG PO TABS: 10.0000 mg | ORAL_TABLET | Freq: Three times a day (TID) | ORAL | 0 refills | Status: DC | PRN

## 2023-04-26 MED ORDER — LORAZEPAM 1 MG PO TABS: ORAL_TABLET | ORAL | 0 refills | Status: DC

## 2023-04-30 NOTE — Progress Notes (Signed)
Hi Valerie Robbins-Draft  Dr. Onalee Hua reviewed your biopsy results and it showed the spot removed was benign (not cancerous).  No additional treatment is required.  The detailed report is available to view in MyChart.  Have a great day!  Kind Regards,  Dr. Kermit Balo Care Team

## 2023-05-01 ENCOUNTER — Other Ambulatory Visit: Payer: Self-pay | Admitting: Family Medicine

## 2023-08-07 ENCOUNTER — Other Ambulatory Visit: Payer: Self-pay | Admitting: Family Medicine

## 2023-08-15 ENCOUNTER — Encounter: Payer: Medicaid Other | Admitting: Family Medicine

## 2023-08-15 ENCOUNTER — Ambulatory Visit (INDEPENDENT_AMBULATORY_CARE_PROVIDER_SITE_OTHER): Payer: Medicaid Other | Admitting: Family Medicine

## 2023-08-15 ENCOUNTER — Encounter: Payer: Self-pay | Admitting: Family Medicine

## 2023-08-15 VITALS — BP 116/75 | HR 70 | Wt 185.6 lb

## 2023-08-15 DIAGNOSIS — E559 Vitamin D deficiency, unspecified: Secondary | ICD-10-CM

## 2023-08-15 DIAGNOSIS — Z0000 Encounter for general adult medical examination without abnormal findings: Secondary | ICD-10-CM | POA: Diagnosis not present

## 2023-08-15 DIAGNOSIS — F411 Generalized anxiety disorder: Secondary | ICD-10-CM

## 2023-08-15 DIAGNOSIS — Z8659 Personal history of other mental and behavioral disorders: Secondary | ICD-10-CM | POA: Diagnosis not present

## 2023-08-15 MED ORDER — FLUOXETINE HCL 20 MG PO CAPS: 20.0000 mg | ORAL_CAPSULE | Freq: Every day | ORAL | 1 refills | Status: DC

## 2023-08-15 MED ORDER — LORAZEPAM 1 MG PO TABS: ORAL_TABLET | ORAL | 0 refills | Status: DC

## 2023-08-15 MED ORDER — CYCLOBENZAPRINE HCL 10 MG PO TABS: 10.0000 mg | ORAL_TABLET | Freq: Three times a day (TID) | ORAL | 1 refills | Status: DC | PRN

## 2023-08-15 NOTE — Patient Instructions (Signed)

## 2023-08-15 NOTE — Progress Notes (Signed)
Office Note 08/15/2023  CC:  Chief Complaint  Patient presents with   Annual Exam    HPI:  Patient is a 37 y.o. female who is here accompanied by her son Valentino Hue for annual health maintenance exam and follow-up anxiety. She continues to feel like fluoxetine 20 mg daily helps her very well.  She does take lorazepam 1 mg as needed.   PMP AWARE reviewed today: most recent rx for lorazepam 1 mg was filled 01/22/2023, # 120, rx by me. No red flags.  Past Medical History:  Diagnosis Date   Abnormal Pap smear    Anxiety and depression    Asthma    childhood   Atypical chest pain 09/01/2016   ED visit: +chest wall TTP.  D-dimer neg, TnI neg, CXR neg, EKG nl, no hypoxia.   Back pain    Bilateral swelling of feet    Family history of congenital hearing loss    Audiology eval at [facility]NT normal.   History of uterine fibroid    Joint pain    Metrorrhagia    with pelvic pain.  Transvag+transabd pelvic u/s NORMAL 05/2014.   Peripheral edema    bilat LL's: suspected venous insuff 06/2019; low dose prn lasix rx'd.   Recurrent genital herpes    HSV 2   SOB (shortness of breath)     Past Surgical History:  Procedure Laterality Date   COLPOSCOPY W/ BIOPSY / CURETTAGE     needs annual paps    Family History  Problem Relation Age of Onset   Anxiety disorder Mother    Hyperlipidemia Mother    Diabetes Mother    Hypertension Mother    Depression Mother    Deafness Mother    Obesity Mother    Anxiety disorder Father    Asthma Father    Alcohol abuse Father    Alcoholism Father    Drug abuse Father    Hearing loss Sister 0       both parents, sister, grandparents were all born deaf   Hypertension Maternal Grandmother    Depression Maternal Grandmother    Diabetes Maternal Grandmother    Anxiety disorder Maternal Grandmother    Deafness Maternal Grandfather     Social History   Socioeconomic History   Marital status: Single    Spouse name: Not on file   Number of  children: 3   Years of education: 14   Highest education level: Not on file  Occupational History   Not on file  Tobacco Use   Smoking status: Never   Smokeless tobacco: Never  Vaping Use   Vaping status: Never Used  Substance and Sexual Activity   Alcohol use: Yes    Alcohol/week: 0.0 standard drinks of alcohol    Comment: social    Drug use: No   Sexual activity: Not Currently    Partners: Male    Birth control/protection: None  Other Topics Concern   Not on file  Social History Narrative   Single, 3 sons.   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   No Tob, occ alcohol, no drugs.   No exercise.   Fun: [name] out with her children.   Denies abuse and feels safe at home.    Social Determinants of Health   Financial Resource Strain: Not on file  Food Insecurity: Not on file  Transportation Needs: Not on file  Physical Activity: Not on file  Stress: Not on file  Social Connections: Not on file  Intimate Partner Violence: Not on file    Outpatient Medications Prior to Visit  Medication Sig Dispense Refill   Multiple Vitamins-Minerals (MULTIVITAMIN WOMEN PO) Take by mouth. One a day Women's     cyclobenzaprine (FLEXERIL) 10 MG tablet Take 1 tablet (10 mg total) by mouth 3 (three) times daily as needed for muscle spasms. 30 tablet 0   FLUoxetine (PROZAC) 20 MG capsule Take 1 capsule (20 mg total) by mouth daily. APPT NEEDED 90 capsule 0   LORazepam (ATIVAN) 1 MG tablet 1-2 tabs 3 times a day as needed for severe anxiety 120 tablet 0   topiramate (TOPAMAX) 25 MG tablet Take 1 tablet (25 mg total) by mouth 2 (two) times daily. (Patient not taking: Reported on 08/15/2023) 45 tablet 0   Vitamin D, Ergocalciferol, (DRISDOL) 1.25 MG (50000 UNIT) CAPS capsule Take 1 capsule (50,000 Units total) by mouth every 7 (seven) days. (Patient not taking: Reported on 08/15/2023) 4 capsule 0   No facility-administered medications prior to visit.    No Known Allergies  Review of  Systems  Constitutional:  Negative for appetite change, chills, fatigue and fever.  HENT:  Negative for congestion, dental problem, ear pain and sore throat.   Eyes:  Negative for discharge, redness and visual disturbance.  Respiratory:  Negative for cough, chest tightness, shortness of breath and wheezing.   Cardiovascular:  Negative for chest pain, palpitations and leg swelling.  Gastrointestinal:  Negative for abdominal pain, blood in stool, diarrhea, nausea and vomiting.  Genitourinary:  Negative for difficulty urinating, dysuria, flank pain, frequency, hematuria and urgency.  Musculoskeletal:  Negative for arthralgias, back pain, joint swelling, myalgias and neck stiffness.  Skin:  Negative for pallor and rash.  Neurological:  Negative for dizziness, speech difficulty, weakness and headaches.  Hematological:  Negative for adenopathy. Does not bruise/bleed easily.  Psychiatric/Behavioral:  Negative for confusion and sleep disturbance. The patient is not nervous/anxious.     PE;    08/15/2023    1:42 PM 04/24/2023   10:09 AM 03/25/2023   11:13 AM  Vitals with BMI  Weight 185 lbs 10 oz  193 lbs 6 oz  Systolic 116 110 130  Diastolic 75 71 86  Pulse 70  72  Body mass index is 29.96 kg/m.  Gen: Alert, well appearing.  Patient is oriented to person, place, time, and situation. AFFECT: pleasant, lucid thought and speech. ENT: Ears: EACs clear, normal epithelium.  TMs with good light reflex and landmarks bilaterally.  Eyes: no injection, icteris, swelling, or exudate.  EOMI, PERRLA. Nose: no drainage or turbinate edema/swelling.  No injection or focal lesion.  Mouth: lips without lesion/swelling.  Oral mucosa pink and moist.  Dentition intact and without obvious caries or gingival swelling.  Oropharynx without erythema, exudate, or swelling.  Neck: supple/nontender.  No LAD, mass, or TM.  Carotid pulses 2+ bilaterally, without bruits. CV: RRR, no m/r/g.   LUNGS: CTA bilat, nonlabored resps,  good aeration in all lung fields. ABD: soft, NT, ND, BS normal.  No hepatospenomegaly or mass.  No bruits. EXT: no clubbing, cyanosis, or edema.  Musculoskeletal: no joint swelling, erythema, warmth, or tenderness.  ROM of all joints intact. Skin - no sores or suspicious lesions or rashes or color changes  Pertinent labs:  Lab Results  Component Value Date   TSH 2.45 03/12/2022   Lab Results  Component Value Date   WBC 5.8 03/12/2022   HGB 13.1 03/12/2022   HCT 40.2 03/12/2022   MCV 91.8  03/12/2022   PLT 415.0 (H) 03/12/2022   Lab Results  Component Value Date   CREATININE 1.01 03/12/2022   BUN 17 03/12/2022   NA 139 03/12/2022   K 4.2 03/12/2022   CL 106 03/12/2022   CO2 27 03/12/2022   Lab Results  Component Value Date   ALT 19 03/12/2022   AST 17 03/12/2022   ALKPHOS 30 (L) 03/12/2022   BILITOT 0.6 03/12/2022   Lab Results  Component Value Date   CHOL 145 03/12/2022   Lab Results  Component Value Date   HDL 37.30 (L) 03/12/2022   Lab Results  Component Value Date   LDLCALC 85 03/12/2022   Lab Results  Component Value Date   TRIG 113.0 03/12/2022   Lab Results  Component Value Date   CHOLHDL 4 03/12/2022   Lab Results  Component Value Date   HGBA1C 5.6 05/16/2022   ASSESSMENT AND PLAN:   #1 health maintenance exam: Reviewed age and gender appropriate health maintenance issues (prudent diet, regular exercise, health risks of tobacco and excessive alcohol, use of seatbelts, fire alarms in home, use of sunscreen).  Also reviewed age and gender appropriate health screening as well as vaccine recommendations. Vaccines: UTD Labs: She is not fasting today.  She we will get fasting labs next week. Cervical ca screening: GYN Dr. Clearance Coots, last exam in EMR is 03/14/2022, Pap nl, HR HPV neg.  History of abnormal Pap--> colposcopy - March 2022.  --> she will call GYN and schedule appointment. Breast ca screening: Start annual screening mammogram age 48.  #2 GAD,  history of depression. Doing very well on fluoxetine 20 mg daily long-term. Also uses lorazepam 1 mg, 1-2 3 times daily as needed.    #3 vitamin D deficiency. Vitamin D level--Future.  FOLLOW UP:  Return in about 6 months (around 02/15/2024) for routine chronic illness f/u.  Signed:  Santiago Bumpers, MD           08/15/2023

## 2023-08-16 ENCOUNTER — Encounter: Payer: Medicaid Other | Admitting: Family Medicine

## 2023-08-20 ENCOUNTER — Encounter: Payer: Self-pay | Admitting: Family Medicine

## 2023-08-20 ENCOUNTER — Other Ambulatory Visit (INDEPENDENT_AMBULATORY_CARE_PROVIDER_SITE_OTHER): Payer: Medicaid Other

## 2023-08-20 DIAGNOSIS — Z0000 Encounter for general adult medical examination without abnormal findings: Secondary | ICD-10-CM

## 2023-08-20 DIAGNOSIS — E559 Vitamin D deficiency, unspecified: Secondary | ICD-10-CM

## 2023-08-20 LAB — CBC WITH DIFFERENTIAL/PLATELET
Basophils Absolute: 0.1 10*3/uL (ref 0.0–0.1)
Basophils Relative: 1.4 % (ref 0.0–3.0)
Eosinophils Absolute: 0.1 10*3/uL (ref 0.0–0.7)
Eosinophils Relative: 3 % (ref 0.0–5.0)
HCT: 40.7 % (ref 36.0–46.0)
Hemoglobin: 12.9 g/dL (ref 12.0–15.0)
Lymphocytes Relative: 43.6 % (ref 12.0–46.0)
Lymphs Abs: 1.9 10*3/uL (ref 0.7–4.0)
MCHC: 31.8 g/dL (ref 30.0–36.0)
MCV: 91.6 fl (ref 78.0–100.0)
Monocytes Absolute: 0.5 10*3/uL (ref 0.1–1.0)
Monocytes Relative: 10.9 % (ref 3.0–12.0)
Neutro Abs: 1.8 10*3/uL (ref 1.4–7.7)
Neutrophils Relative %: 41.1 % — ABNORMAL LOW (ref 43.0–77.0)
Platelets: 371 10*3/uL (ref 150.0–400.0)
RBC: 4.44 Mil/uL (ref 3.87–5.11)
RDW: 13.4 % (ref 11.5–15.5)
WBC: 4.4 10*3/uL (ref 4.0–10.5)

## 2023-08-20 LAB — POC URINALSYSI DIPSTICK (AUTOMATED)
Bilirubin, UA: NEGATIVE
Blood, UA: NEGATIVE
Glucose, UA: NEGATIVE
Leukocytes, UA: NEGATIVE
Nitrite, UA: NEGATIVE
Protein, UA: NEGATIVE
Spec Grav, UA: 1.02 (ref 1.010–1.025)
Urobilinogen, UA: NEGATIVE E.U./dL — AB
pH, UA: 6 (ref 5.0–8.0)

## 2023-08-20 LAB — COMPREHENSIVE METABOLIC PANEL
ALT: 34 U/L (ref 0–35)
AST: 26 U/L (ref 0–37)
Albumin: 4.1 g/dL (ref 3.5–5.2)
Alkaline Phosphatase: 38 U/L — ABNORMAL LOW (ref 39–117)
BUN: 15 mg/dL (ref 6–23)
CO2: 25 mEq/L (ref 19–32)
Calcium: 9.2 mg/dL (ref 8.4–10.5)
Chloride: 106 mEq/L (ref 96–112)
Creatinine, Ser: 0.96 mg/dL (ref 0.40–1.20)
GFR: 74.88 mL/min (ref >60.00)
Glucose, Bld: 82 mg/dL (ref 70–99)
Potassium: 4.4 mEq/L (ref 3.5–5.1)
Sodium: 140 mEq/L (ref 135–145)
Total Bilirubin: 0.4 mg/dL (ref 0.2–1.2)
Total Protein: 6.9 g/dL (ref 6.0–8.3)

## 2023-08-20 LAB — LIPID PANEL
Cholesterol: 154 mg/dL (ref 0–200)
HDL: 43.8 mg/dL (ref >39.00)
LDL Cholesterol: 93 mg/dL (ref 0–99)
NonHDL: 109.84
Total CHOL/HDL Ratio: 4
Triglycerides: 85 mg/dL (ref 0.0–149.0)
VLDL: 17 mg/dL (ref 0.0–40.0)

## 2023-08-20 LAB — TSH: TSH: 1.7 u[IU]/mL (ref 0.35–5.50)

## 2023-08-20 NOTE — Telephone Encounter (Signed)
This urinalysis is normal. No worries.

## 2023-09-02 MED ORDER — VITAMIN D (ERGOCALCIFEROL) 1.25 MG (50000 UNIT) PO CAPS: 50000.0000 [IU] | ORAL_CAPSULE | ORAL | 3 refills | Status: AC

## 2023-09-02 NOTE — Telephone Encounter (Signed)
Molli Knock, vitamin D prescription sent

## 2024-01-17 ENCOUNTER — Encounter: Payer: Self-pay | Admitting: Family Medicine

## 2024-01-17 ENCOUNTER — Telehealth: Payer: Medicaid Other | Admitting: Family Medicine

## 2024-01-31 DIAGNOSIS — L03039 Cellulitis of unspecified toe: Secondary | ICD-10-CM | POA: Diagnosis not present

## 2024-01-31 DIAGNOSIS — L608 Other nail disorders: Secondary | ICD-10-CM | POA: Diagnosis not present

## 2024-01-31 DIAGNOSIS — L601 Onycholysis: Secondary | ICD-10-CM | POA: Diagnosis not present

## 2024-02-05 DIAGNOSIS — R051 Acute cough: Secondary | ICD-10-CM | POA: Diagnosis not present

## 2024-02-05 DIAGNOSIS — J101 Influenza due to other identified influenza virus with other respiratory manifestations: Secondary | ICD-10-CM | POA: Diagnosis not present

## 2024-02-24 ENCOUNTER — Other Ambulatory Visit: Payer: Self-pay | Admitting: Family Medicine

## 2024-03-02 ENCOUNTER — Other Ambulatory Visit: Payer: Self-pay | Admitting: Family Medicine

## 2024-03-02 MED ORDER — LORAZEPAM 1 MG PO TABS: ORAL_TABLET | ORAL | 0 refills | Status: DC

## 2024-03-02 NOTE — Telephone Encounter (Signed)
 15 lorazepam tabs prescribed. Needs follow-up.

## 2024-03-17 ENCOUNTER — Telehealth: Payer: Self-pay

## 2024-03-17 NOTE — Telephone Encounter (Signed)
 Please see message.

## 2024-03-17 NOTE — Telephone Encounter (Signed)
 Copied from CRM [id number]. Topic: Clinical - Prescription Issue >> Mar 17, 2024 12:58 PM Sim Boast F wrote: Reason for CRM: Patient needs the LORazepam resent to [facility] pharmacy on file, she says she lost the bottle and a new script has to be sent to pharmacy. She says this is urgent and requested a call back from [place]   Please see other encounter 03/02/24 regarding medication.

## 2024-03-18 MED ORDER — LORAZEPAM 1 MG PO TABS: ORAL_TABLET | ORAL | 0 refills | Status: DC

## 2024-03-18 NOTE — Telephone Encounter (Signed)
 Pt advised of provider recommendations, she will call back to schedule appt

## 2024-03-18 NOTE — Telephone Encounter (Signed)
(  Pt without any hx of misuse or abuse of drugs, no hx of requesting rx's early or asking to replace lost rx's, no hx of any other drug seeking behavior, no hx of frequent cancellation of follow up appointments).  OK, #15 sent but remind her that replacing a lost prescription is not something I can do again.

## 2024-04-10 ENCOUNTER — Emergency Department

## 2024-04-10 ENCOUNTER — Emergency Department
Admission: EM | Admit: 2024-04-10 | Discharge: 2024-04-10 | Disposition: A | Discharge status: Home / Self Care | Attending: Emergency Medicine | Admitting: Emergency Medicine

## 2024-04-10 ENCOUNTER — Other Ambulatory Visit: Payer: Self-pay

## 2024-04-10 DIAGNOSIS — S060XAA Concussion with loss of consciousness status unknown, initial encounter: Secondary | ICD-10-CM | POA: Diagnosis not present

## 2024-04-10 DIAGNOSIS — S161XXA Strain of muscle, fascia and tendon at neck level, initial encounter: Secondary | ICD-10-CM | POA: Diagnosis not present

## 2024-04-10 DIAGNOSIS — S0990XA Unspecified injury of head, initial encounter: Secondary | ICD-10-CM | POA: Insufficient documentation

## 2024-04-10 DIAGNOSIS — W228XXA Striking against or struck by other objects, initial encounter: Secondary | ICD-10-CM | POA: Diagnosis not present

## 2024-04-10 DIAGNOSIS — X58XXXA Exposure to other specified factors, initial encounter: Secondary | ICD-10-CM | POA: Diagnosis not present

## 2024-04-10 MED ORDER — CYCLOBENZAPRINE HCL 10 MG PO TABS: 10.0000 mg | ORAL_TABLET | Freq: Three times a day (TID) | ORAL | 0 refills | Status: AC | PRN

## 2024-04-10 MED ORDER — KETOROLAC TROMETHAMINE 10 MG PO TABS: 10.0000 mg | ORAL_TABLET | Freq: Four times a day (QID) | ORAL | 0 refills | Status: DC | PRN

## 2024-04-10 MED ORDER — ACETAMINOPHEN 500 MG PO TABS
1000.0000 mg | ORAL_TABLET | Freq: Once | ORAL | Status: AC
  Administered 2024-04-10: 1000 mg via ORAL
  Filled 2024-04-10: qty 2

## 2024-04-10 NOTE — Discharge Instructions (Addendum)
 You were seen in the ER today for evaluation after head injury.  Your CT scans were fortunately reassuring.  I suspect you have a concussion from your head injury.  Follow with your primary care doctor for further evaluation.  I have also included information for follow-up with neurology as needed.  You can take as milligrams of Tylenol  every 6 hours to help with your pain.  Do not take more than 4 g in 1 day.  I have sent a prescription for an anti-inflammatory medication as well as a muscle relaxer to your pharmacy.  The muscle laxer can make you drowsy, do not drive or operate machinery when taking this.  Return to the ER for new or worsening symptoms.

## 2024-04-10 NOTE — ED Provider Notes (Signed)
 [facility] Provider Note    Event Date/Time   First MD Initiated Contact with Patient 04/10/24 1231     (approximate)   History   Head Injury   HPI  Valerie Robbins is a 38 year old female presenting to the emergency department for evaluation following a head injury.  2 days ago patient accidentally struck her head on a heavy cabinet.  Unsure if she had LOC.  Since that time, she has had worsening headache as well as pain in her neck.  Initially went to urgent care, was directed to the ER for further evaluation.  No numbness, tingling or focal weakness.     Physical Exam   Triage Vital Signs: ED Triage Vitals  Encounter Vitals Group     BP 04/10/24 1213 (!) 144/107     Systolic BP Percentile --      Diastolic BP Percentile --      Pulse Rate 04/10/24 1213 67     Resp 04/10/24 1213 18     Temp 04/10/24 1213 98 F (36.7 C)     Temp src --      SpO2 04/10/24 1213 100 %     Weight 04/10/24 1243 185 lb 10 oz (84.2 kg)     Height 04/10/24 1243 5\' 6"  (1.676 m)     Head Circumference --      Peak Flow --      Pain Score 04/10/24 1210 9     Pain Loc --      Pain Education --      Exclude from Growth Chart --     Most recent vital signs: Vitals:   04/10/24 1213  BP: (!) 144/107  Pulse: 67  Resp: 18  Temp: 98 F (36.7 C)  SpO2: 100%     Nursing notes and vital signs reviewed.  General: Adult female, sitting upright in bed, awake, reactive Head: Tender to palpation over the left superior scalp without open areas of skin Neck: No focal midline tenderness.  Tenderness over the left paraspinous musculature in the neck Chest: Symmetric chest rise, no tenderness to palpation.  Cardiac: Regular rhythm and rate.  Respiratory: Lungs clear to auscultation Abdomen: Soft, nondistended. No tenderness to palpation.  MSK: No deformity to bilateral upper and lower extremity. Full range of motion to bilateral upper lower extremity. Neuro: Alert and  oriented, normal extraocular movements, symmetric facial movement, sensation intact over bilateral upper and lower extremities with 5 out of 5 strength.  Normal finger-to-nose testing.   ED Results / Procedures / Treatments   Labs (all labs ordered are listed, but only abnormal results are displayed) Labs Reviewed - No data to display   EKG EKG independently reviewed interpreted by myself (ER attending) demonstrates:    RADIOLOGY Imaging independently reviewed and interpreted by myself demonstrates:  CT head without acute bleed CT C-spine without acute fracture  Formal Radiology Read:  CT HEAD WO CONTRAST ( ) Result Date: 04/10/2024 CLINICAL DATA:  Head injury, sent from urgent care after hitting head. EXAM: CT HEAD WITHOUT CONTRAST CT CERVICAL SPINE WITHOUT CONTRAST TECHNIQUE: Multidetector CT imaging of the head and cervical spine was performed following the standard protocol without intravenous contrast. Multiplanar CT image reconstructions of the cervical spine were also generated. RADIATION DOSE REDUCTION: This exam was performed according to the departmental dose-optimization program which includes automated exposure control, adjustment of the mA and/or kV according to patient size and/or use of iterative reconstruction technique. COMPARISON:  CT cervical spine  08/23/2012. FINDINGS: CT HEAD FINDINGS Brain: No acute intracranial hemorrhage. No CT evidence of acute infarct. No edema, mass effect, or midline shift. The basilar cisterns are patent. Ventricles: The ventricles are normal. Vascular: No hyperdense vessel or unexpected calcification. Skull: No acute or aggressive finding. Orbits: Orbits are symmetric. Sinuses: Focal mucosal thickening in the right anterior ethmoid air cells. Additional mucosal thickening in the left sphenoid sinus involving the pterygoid recess. Other: Mastoid air cells are clear. CT CERVICAL SPINE FINDINGS Alignment: Reversal of the normal cervical lordosis. No  listhesis. No facet subluxation or dislocation. Skull base and vertebrae: No compression fracture or displaced fracture in the cervical spine. No suspicious osseous lesion. Soft tissues and spinal canal: No prevertebral fluid or swelling. No visible canal hematoma. Disc levels: Intervertebral disc spaces are maintained. No high-grade osseous spinal canal stenosis. No high-grade osseous foraminal stenosis. Upper chest: Negative. Other: None. IMPRESSION: No CT evidence of acute intracranial abnormality. No acute fracture or traumatic malalignment of the cervical spine. Electronically Signed   By: Denny Flack M.D.   On: 04/10/2024 13:03   CT Cervical Spine Wo Contrast Result Date: 04/10/2024 CLINICAL DATA:  Head injury, sent from urgent care after hitting head. EXAM: CT HEAD WITHOUT CONTRAST CT CERVICAL SPINE WITHOUT CONTRAST TECHNIQUE: Multidetector CT imaging of the head and cervical spine was performed following the standard protocol without intravenous contrast. Multiplanar CT image reconstructions of the cervical spine were also generated. RADIATION DOSE REDUCTION: This exam was performed according to the departmental dose-optimization program which includes automated exposure control, adjustment of the mA and/or kV according to patient size and/or use of iterative reconstruction technique. COMPARISON:  CT cervical spine 08/23/2012. FINDINGS: CT HEAD FINDINGS Brain: No acute intracranial hemorrhage. No CT evidence of acute infarct. No edema, mass effect, or midline shift. The basilar cisterns are patent. Ventricles: The ventricles are normal. Vascular: No hyperdense vessel or unexpected calcification. Skull: No acute or aggressive finding. Orbits: Orbits are symmetric. Sinuses: Focal mucosal thickening in the right anterior ethmoid air cells. Additional mucosal thickening in the left sphenoid sinus involving the pterygoid recess. Other: Mastoid air cells are clear. CT CERVICAL SPINE FINDINGS Alignment:  Reversal of the normal cervical lordosis. No listhesis. No facet subluxation or dislocation. Skull base and vertebrae: No compression fracture or displaced fracture in the cervical spine. No suspicious osseous lesion. Soft tissues and spinal canal: No prevertebral fluid or swelling. No visible canal hematoma. Disc levels: Intervertebral disc spaces are maintained. No high-grade osseous spinal canal stenosis. No high-grade osseous foraminal stenosis. Upper chest: Negative. Other: None. IMPRESSION: No CT evidence of acute intracranial abnormality. No acute fracture or traumatic malalignment of the cervical spine. Electronically Signed   By: Denny Flack M.D.   On: 04/10/2024 13:03    PROCEDURES:  Critical Care performed: No  Procedures   MEDICATIONS ORDERED IN ED: Medications  acetaminophen  (TYLENOL ) tablet 1,000 mg (has no administration in time range)     IMPRESSION / MDM / ASSESSMENT AND PLAN / ED COURSE  I reviewed the triage vital signs and the nursing notes.  Differential diagnosis includes, but is not limited to, intracranial bleed, spine fracture  Patient's presentation is most consistent with acute presentation with potential threat to life or bodily function.  38 year old female presenting to the emergency department for evaluation of head trauma.  Worsening headache and neck pain, but during physical exam.  CT head and C-spine ordered from triage without acute abnormalities.  Presentation is most consistent with concussion with cervical strain.  Discussed supportive care measures.  Patient is comfortable with discharge home.  She will follow-up with her PCP, included as needed follow-up with neurology if symptoms or not improving.  Discussed multimodal pain control.  Will DC with prescription for Toradol , instructed to not take other NSAIDs, as well as Flexeril .  Strict return precautions provided.  Patient discharged in stable condition.      FINAL CLINICAL IMPRESSION(S) / ED  DIAGNOSES   Final diagnoses:  Closed head injury, initial encounter  Neck strain, initial encounter     Rx / DC Orders   ED Discharge Orders     None        Note:  This document was prepared using Dragon voice recognition software and may include unintentional dictation errors.   Claria Crofts, MD 04/10/24 [phone]

## 2024-04-10 NOTE — ED Notes (Signed)
 Pt not in room from ct scan at present

## 2024-04-10 NOTE — ED Triage Notes (Signed)
 Pt comes with c/o head injury.  Pt was seen at [facility] and hit her head. Pt sent here for further evaluation. Pt states it hurts to talk.

## 2024-04-21 ENCOUNTER — Ambulatory Visit (INDEPENDENT_AMBULATORY_CARE_PROVIDER_SITE_OTHER): Admitting: Family Medicine

## 2024-04-21 ENCOUNTER — Encounter: Payer: Self-pay | Admitting: Family Medicine

## 2024-04-21 VITALS — BP 118/76 | HR 71 | Ht 66.0 in | Wt 171.2 lb

## 2024-04-21 DIAGNOSIS — S060XAD Concussion with loss of consciousness status unknown, subsequent encounter: Secondary | ICD-10-CM | POA: Diagnosis not present

## 2024-04-21 DIAGNOSIS — F411 Generalized anxiety disorder: Secondary | ICD-10-CM | POA: Diagnosis not present

## 2024-04-21 DIAGNOSIS — Z79899 Other long term (current) drug therapy: Secondary | ICD-10-CM | POA: Diagnosis not present

## 2024-04-21 DIAGNOSIS — F410 Panic disorder [episodic paroxysmal anxiety] without agoraphobia: Secondary | ICD-10-CM

## 2024-04-21 MED ORDER — FLUOXETINE HCL 20 MG PO CAPS: 20.0000 mg | ORAL_CAPSULE | Freq: Every day | ORAL | 3 refills | Status: AC

## 2024-04-21 MED ORDER — LORAZEPAM 1 MG PO TABS: ORAL_TABLET | ORAL | 5 refills | Status: AC

## 2024-04-21 NOTE — Progress Notes (Signed)
OFFICE VISIT  04/21/2024  CC:  Chief Complaint  Patient presents with   Follow-up    ED; pt was seen at [facility] for head injury and neck strain    Patient is a 38 y.o. female who presents for emergency department follow-up for closed head injury and neck strain. I reviewed emergency department encounter data from 04/10/2024. She had accidentally struck her head on a heavy cabinet 2 days prior to presentation.  Worsening headache and pain in her neck after. CT head without acute bleeding.  CT C-spine without acute fracture. Diagnosed with concussion and cervical strain. She was discharged home with a prescription for Toradol  and Flexeril .  INTERIM HX: States she was having a panic attack right before she hit her head. She has been having increase in baseline level of anxiety and increased spontaneous panic attacks over the last 2 months. She had to miss work every day last week due to anxiety as well as her head injury.  Since the ED visit she has had intermittent headaches over the top of her head.  Has had some brief periods of disequilibrium.  She does feel like she sometimes has trouble completing thoughts--> a bit of cognitive clouding. No visual abnormalities, no hearing abnormalities, no tinnitus, no nausea or vomiting.  No focal weakness.  No tremor.  No paresthesias. She has had some pain across the back of the neck on the left side and this has extended into the trapezius/supraspinatus muscle region.  She is taking Flexeril  as needed.  When she has a panic attack it typically comes out of the blue.  She feels intense fear, gets clammy and short of breath, gets tense all over and feels her heart racing.  Sometimes feels chest pain as well. She has continued to take her Prozac  20 mg daily. She took her last lorazepam  dose yesterday. Of note, I did give a 30-day supply as an early refill recently.  She has never had any problems with misuse/abuse of any medication.  PMP  [place] reviewed today: most recent rx for lorazepam  was filled 03/18/24, # 15, rx by me. No red flags.   Past Medical History:  Diagnosis Date   Abnormal Pap smear    Anxiety and depression    Asthma    childhood   Atypical chest pain 09/01/2016   ED visit: +chest wall TTP.  D-dimer neg, TnI neg, CXR neg, EKG nl, no hypoxia.   Back pain    Bilateral swelling of feet    Family history of congenital hearing loss    Audiology eval at [facility] ENT normal.   History of uterine fibroid    Joint pain    Metrorrhagia    with pelvic pain.  Transvag+transabd pelvic u/s NORMAL 05/2014.   Peripheral edema    bilat LL's: suspected venous insuff 06/2019; low dose prn lasix  rx'd.   Recurrent genital herpes    HSV 2   SOB (shortness of breath)     Past Surgical History:  Procedure Laterality Date   COLPOSCOPY W/ BIOPSY / CURETTAGE     needs annual paps    Outpatient Medications Prior to Visit  Medication Sig Dispense Refill   cyclobenzaprine  (FLEXERIL ) 10 MG tablet Take 1 tablet (10 mg total) by mouth 3 (three) times daily as needed for muscle spasms. 20 tablet 0   ketorolac  (TORADOL ) 10 MG tablet Take 1 tablet (10 mg total) by mouth every 6 (six) hours as needed. 20 tablet 0   Multiple Vitamins-Minerals (  MULTIVITAMIN WOMEN PO) Take by mouth. One a day Women's     Vitamin D , Ergocalciferol , (DRISDOL ) 1.25 MG (50000 UNIT) CAPS capsule Take 1 capsule (50,000 Units total) by mouth every 7 (seven) days. 12 capsule 3   cyclobenzaprine  (FLEXERIL ) 10 MG tablet Take 1 tablet (10 mg total) by mouth 3 (three) times daily as needed for muscle spasms. 60 tablet 1   FLUoxetine  (PROZAC ) 20 MG capsule Take 1 capsule (20 mg total) by mouth daily. OFFICE VISIT NEEDED FOR FURTHER REFILLS 30 capsule 0   LORazepam  (ATIVAN ) 1 MG tablet 1-2 tabs 3 times a day as needed for severe anxiety 15 tablet 0   No facility-administered medications prior to visit.    No Known Allergies  Review of Systems As per  HPI  PE:    04/21/2024   10:53 AM 04/10/2024   12:43 PM 04/10/2024   12:13 PM  Vitals with BMI  Height 5\' 6"  5\' 6"    Weight 171 lbs 3 oz 185 lbs 10 oz   BMI 27.65 29.98   Systolic 118  144  Diastolic 76  107  Pulse 71  67     Physical Exam  General: Alert and well-appearing. Affect: Anxious, pleasant, lucid thought and speech. PERRLA. EOMI. Cardiovascular: Regular rhythm and rate without murmur rub or gallop. Lungs are clear bilaterally.  Breathing is nonlabored. Extremities: No edema. Neuro: CN 2-12 intact bilaterally, strength 5/5 in proximal and distal upper extremities and lower extremities bilaterally.   No tremor.  No disdiadochokinesis.  No ataxia.  Upper extremity and lower extremity DTRs symmetric.  No pronator drift.  LABS:  Last CBC Lab Results  Component Value Date   WBC 4.4 08/20/2023   HGB 12.9 08/20/2023   HCT 40.7 08/20/2023   MCV 91.6 08/20/2023   MCH 29.5 09/01/2016   RDW 13.4 08/20/2023   PLT 371.0 08/20/2023   Last metabolic panel Lab Results  Component Value Date   GLUCOSE 82 08/20/2023   NA 140 08/20/2023   K 4.4 08/20/2023   CL 106 08/20/2023   CO2 25 08/20/2023   BUN 15 08/20/2023   CREATININE 0.96 08/20/2023   GFR 74.88 08/20/2023   CALCIUM 9.2 08/20/2023   PROT 6.9 08/20/2023   ALBUMIN 4.1 08/20/2023   BILITOT 0.4 08/20/2023   ALKPHOS 38 (L) 08/20/2023   AST 26 08/20/2023   ALT 34 08/20/2023   ANIONGAP 6 09/01/2016   IMPRESSION AND PLAN:  #1 GAD with panic disorder. Worsened symptoms the last 2 months. Continue Prozac  20 mg a day.  We discussed possible increase in dose but she is fearful of side effects and also in the setting of her recent head injury it would probably be best to keep this dose the same. Continue lorazepam  1 mg 3 times daily as needed. Urine drug screen today. Controlled substance contract updated.  #2 concussion.  Ongoing symptoms of headache and intermittent disequilibrium. She has some left-sided neck  muscular strain. She will continue to try to contact the neurology office that she was referred to in the emergency department. She will continue Flexeril  10 mg 3 times daily as needed.  She is aware of the potential interaction with Prozac  and lorazepam . She will avoid concomitant dosing with her lorazepam .  FMLA forms filled out today.  An After Visit Summary was printed and given to the patient.  FOLLOW UP: Return in about 4 weeks (around 05/19/2024) for f/u panic.  Signed:  Phil Preslynn Bier, MD  04/21/2024  

## 2024-04-23 LAB — DRUG MONITORING PANEL 376104, URINE
Amphetamines: NEGATIVE ng/mL (ref <500)
Barbiturates: NEGATIVE ng/mL (ref <300)
Benzodiazepines: NEGATIVE ng/mL (ref <100)
Cocaine Metabolite: NEGATIVE ng/mL (ref <150)
Desmethyltramadol: NEGATIVE ng/mL (ref <100)
Opiates: NEGATIVE ng/mL (ref <100)
Oxycodone: NEGATIVE ng/mL (ref <100)
Tramadol: NEGATIVE ng/mL (ref <100)

## 2024-04-23 LAB — DM TEMPLATE

## 2024-05-19 ENCOUNTER — Ambulatory Visit: Admitting: Family Medicine

## 2024-05-19 NOTE — Progress Notes (Deleted)
 OFFICE VISIT  05/19/2024  CC: No chief complaint on file.   Patient is a 38 y.o. female who presents for 1 month follow-up anxiety and postconcussion syndrome. A/P as of last visit: "#1 GAD with panic disorder. Worsened symptoms the last 2 months. Continue Prozac  20 mg a day.  We discussed possible increase in dose but she is fearful of side effects and also in the setting of her recent head injury it would probably be best to keep this dose the same. Continue lorazepam  1 mg 3 times daily as needed. Urine drug screen today. Controlled substance contract updated.   #2 concussion.  Ongoing symptoms of headache and intermittent disequilibrium. She has some left-sided neck muscular strain. She will continue to try to contact the neurology office that she was referred to in the emergency department. She will continue Flexeril  10 mg 3 times daily as needed.  She is aware of the potential interaction with Prozac  and lorazepam . She will avoid concomitant dosing with her lorazepam . FMLA forms filled out today."  INTERIM HX: ***   PMP [place] reviewed today: most recent rx for lorazepam  1 mg was filled 04/21/2024, # 90, rx by me. No red flags.  Past Medical History:  Diagnosis Date   Abnormal Pap smear    Anxiety and depression    Asthma    childhood   Atypical chest pain 09/01/2016   ED visit: +chest wall TTP.  D-dimer neg, TnI neg, CXR neg, EKG nl, no hypoxia.   Back pain    Bilateral swelling of feet    Family history of congenital hearing loss    Audiology eval at [facility] ENT normal.   History of uterine fibroid    Joint pain    Metrorrhagia    with pelvic pain.  Transvag+transabd pelvic u/s NORMAL 05/2014.   Peripheral edema    bilat LL's: suspected venous insuff 06/2019; low dose prn lasix  rx'd.   Recurrent genital herpes    HSV 2   SOB (shortness of breath)     Past Surgical History:  Procedure Laterality Date   COLPOSCOPY W/ BIOPSY / CURETTAGE     needs annual paps     Outpatient Medications Prior to Visit  Medication Sig Dispense Refill   cyclobenzaprine  (FLEXERIL ) 10 MG tablet Take 1 tablet (10 mg total) by mouth 3 (three) times daily as needed for muscle spasms. 20 tablet 0   FLUoxetine  (PROZAC ) 20 MG capsule Take 1 capsule (20 mg total) by mouth daily. 90 capsule 3   ketorolac  (TORADOL ) 10 MG tablet Take 1 tablet (10 mg total) by mouth every 6 (six) hours as needed. 20 tablet 0   LORazepam  (ATIVAN ) 1 MG tablet 1-2 tabs 3 times a day as needed for severe anxiety 90 tablet 5   Multiple Vitamins-Minerals (MULTIVITAMIN WOMEN PO) Take by mouth. One a day Women's     Vitamin D , Ergocalciferol , (DRISDOL ) 1.25 MG (50000 UNIT) CAPS capsule Take 1 capsule (50,000 Units total) by mouth every 7 (seven) days. 12 capsule 3   No facility-administered medications prior to visit.    No Known Allergies  Review of Systems As per HPI  PE:    04/21/2024   10:53 AM 04/10/2024   12:43 PM 04/10/2024   12:13 PM  Vitals with BMI  Height 5\' 6"  5\' 6"    Weight 171 lbs 3 oz 185 lbs 10 oz   BMI 27.65 29.98   Systolic 118  144  Diastolic 76  107  Pulse 71  67     Physical Exam  ***  LABS:  {Labs (Optional):23779}  IMPRESSION AND PLAN:  No problem-specific Assessment & Plan notes found for this encounter.   An After Visit Summary was printed and given to the patient.  FOLLOW UP: No follow-ups on file.  @esig @

## 2024-06-04 ENCOUNTER — Ambulatory Visit: Admitting: Obstetrics

## 2024-06-04 ENCOUNTER — Other Ambulatory Visit (HOSPITAL_COMMUNITY)
Admission: RE | Admit: 2024-06-04 | Discharge: 2024-06-04 | Disposition: A | Discharge status: Home / Self Care | Source: Ambulatory Visit | Attending: Obstetrics | Admitting: Obstetrics

## 2024-06-04 ENCOUNTER — Encounter: Payer: Self-pay | Admitting: Obstetrics

## 2024-06-04 VITALS — BP 120/76 | HR 75 | Ht 65.0 in | Wt 167.0 lb

## 2024-06-04 DIAGNOSIS — N946 Dysmenorrhea, unspecified: Secondary | ICD-10-CM | POA: Diagnosis not present

## 2024-06-04 DIAGNOSIS — N898 Other specified noninflammatory disorders of vagina: Secondary | ICD-10-CM | POA: Diagnosis not present

## 2024-06-04 DIAGNOSIS — Z01419 Encounter for gynecological examination (general) (routine) without abnormal findings: Secondary | ICD-10-CM

## 2024-06-04 DIAGNOSIS — Z3009 Encounter for other general counseling and advice on contraception: Secondary | ICD-10-CM | POA: Diagnosis not present

## 2024-06-04 DIAGNOSIS — Z1239 Encounter for other screening for malignant neoplasm of breast: Secondary | ICD-10-CM | POA: Diagnosis not present

## 2024-06-04 LAB — POCT URINALYSIS DIPSTICK
Bilirubin, UA: NEGATIVE
Blood, UA: NEGATIVE
Glucose, UA: NEGATIVE
Ketones, UA: NEGATIVE
Leukocytes, UA: NEGATIVE
Nitrite, UA: NEGATIVE
Protein, UA: NEGATIVE
Spec Grav, UA: 1.025 (ref 1.010–1.025)
Urobilinogen, UA: 0.2 U/dL
pH, UA: 6 (ref 5.0–8.0)

## 2024-06-04 MED ORDER — IBUPROFEN 800 MG PO TABS: 800.0000 mg | ORAL_TABLET | Freq: Three times a day (TID) | ORAL | 5 refills | Status: AC | PRN

## 2024-06-04 NOTE — Progress Notes (Signed)
 Subjective:        Valerie Robbins is a 38 y.o. female here for a routine exam.  Current complaints: Vaginal discharge.    Personal health questionnaire:  Is patient Ashkenazi Jewish, have a family history of breast and/or ovarian cancer: no Is there a family history of uterine cancer diagnosed at age < 54, gastrointestinal cancer, urinary tract cancer, family member who is a Personnel officer syndrome-associated carrier: no Is the patient overweight and hypertensive, family history of diabetes, personal history of gestational diabetes, preeclampsia or PCOS: no Is patient over 55, have PCOS,  family history of premature CHD under age 8, diabetes, smoke, have hypertension or peripheral artery disease:  no At any time, has a partner hit, kicked or otherwise hurt or frightened you?: no Over the past 2 weeks, have you felt down, depressed or hopeless?: no Over the past 2 weeks, have you felt little interest or pleasure in doing things?:no   Gynecologic History Patient's last menstrual period was 05/31/2024 (exact date). Contraception: abstinence Last Pap: 2023. Results were: normal Last mammogram: n/a. Results were: n/a  Obstetric History OB History  Gravida Para Term Preterm AB Living  5 3 3  0 2 3  SAB IAB Ectopic Multiple Live Births  1 0 1 0 3    # Outcome Date GA Lbr Len/2nd Weight Sex Type Anes PTL Lv  5 Term 03/02/12 [redacted]w[redacted]d 06:45 / 00:02 7 lb 8.6 oz (3.42 kg) M Vag-Vacuum EPI  LIV     Birth Comments: WNL  4 SAB           3 Ectopic           2 Term     M Vag-Spont   LIV  1 Term     M Vag-Spont  Y LIV    Past Medical History:  Diagnosis Date   Abnormal Pap smear    Anxiety and depression    Asthma    childhood   Atypical chest pain 09/01/2016   ED visit: +chest wall TTP.  D-dimer neg, TnI neg, CXR neg, EKG nl, no hypoxia.   Back pain    Bilateral swelling of feet    Family history of congenital hearing loss    [facility] eval at [facility] normal.   History of uterine  fibroid    Joint pain    Metrorrhagia    with pelvic pain.  Transvag+transabd pelvic u/s NORMAL 05/2014.   Peripheral edema    bilat LL's: suspected venous insuff 06/2019; low dose prn lasix  rx'd.   Recurrent genital herpes    HSV 2   SOB (shortness of breath)     Past Surgical History:  Procedure Laterality Date   COLPOSCOPY W/ BIOPSY / CURETTAGE     needs annual paps     Current Outpatient Medications:    cyclobenzaprine  (FLEXERIL ) 10 MG tablet, Take 1 tablet (10 mg total) by mouth 3 (three) times daily as needed for muscle spasms., Disp: 20 tablet, Rfl: 0   FLUoxetine  (PROZAC ) 20 MG capsule, Take 1 capsule (20 mg total) by mouth daily., Disp: 90 capsule, Rfl: 3   ibuprofen  (ADVIL ) 800 MG tablet, Take 1 tablet (800 mg total) by mouth every 8 (eight) hours as needed., Disp: 30 tablet, Rfl: 5   LORazepam  (ATIVAN ) 1 MG tablet, 1-2 tabs 3 times a day as needed for severe anxiety, Disp: 90 tablet, Rfl: 5   Multiple Vitamins-Minerals (MULTIVITAMIN WOMEN PO), Take by mouth. One a day Women's, Disp: ,  Rfl:    Vitamin D , Ergocalciferol , (DRISDOL ) 1.25 MG (50000 UNIT) CAPS capsule, Take 1 capsule (50,000 Units total) by mouth every 7 (seven) days., Disp: 12 capsule, Rfl: 3 No Known Allergies  Social History   Tobacco Use   Smoking status: Never   Smokeless tobacco: Never  Substance Use Topics   Alcohol use: Yes    Alcohol/week: 0.0 standard drinks of alcohol    Comment: social     Family History  Problem Relation Age of Onset   Anxiety disorder Mother    Hyperlipidemia Mother    Diabetes Mother    Hypertension Mother    Depression Mother    Deafness Mother    Obesity Mother    Anxiety disorder Father    Asthma Father    Alcohol abuse Father    Alcoholism Father    Drug abuse Father    Hearing loss Sister 0       both parents, sister, grandparents were all born deaf   Hypertension Maternal Grandmother    Depression Maternal Grandmother    Diabetes Maternal Grandmother     Anxiety disorder Maternal Grandmother    Deafness Maternal Grandfather       Review of Systems  Constitutional: negative for fatigue and weight loss Respiratory: negative for cough and wheezing Cardiovascular: negative for chest pain, fatigue and palpitations Gastrointestinal: negative for abdominal pain and change in bowel habits Musculoskeletal:negative for myalgias Neurological: negative for gait problems and tremors Behavioral/Psych: negative for abusive relationship, depression Endocrine: negative for temperature intolerance    Genitourinary: positive for vaginal discharge.  negative for abnormal menstrual periods, genital lesions, hot flashes, sexual problems  Integument/breast: negative for breast lump, breast tenderness, nipple discharge and skin lesion(s)    Objective:       BP 120/76   Pulse 75   Ht 5' 5 (1.651 m)   Wt 167 lb (75.8 kg)   LMP 05/31/2024 (Exact Date)   BMI 27.79 kg/m  General:   Alert and no distress  Skin:   no rash or abnormalities  Lungs:   clear to auscultation bilaterally  Heart:   regular rate and rhythm, S1, S2 normal, no murmur, click, rub or gallop  Breasts:   normal without suspicious masses, skin or nipple changes or axillary nodes  Abdomen:  normal findings: no organomegaly, soft, non-tender and no hernia  Pelvis:  External genitalia: normal general appearance Urinary system: urethral meatus normal and bladder without fullness, nontender Vaginal: normal without tenderness, induration or masses Cervix: normal appearance Adnexa: normal bimanual exam Uterus: anteverted and non-tender, normal size   Lab Review Urine pregnancy test Labs reviewed yes Radiologic studies reviewed no  I have spent a total of 20 minutes of face-to-face time, excluding clinical staff time, reviewing notes and preparing to see patient, ordering tests and/or medications, and counseling the patient.    Assessment:    1. Encounter for annual routine  gynecological examination (Primary) Rx: - Cytology - PAP( [place]) - POCT urinalysis dipstick  2. Vaginal discharge Rx: - Cervicovaginal ancillary only  3. Screening breast examination Rx: - MM Digital Screening; Future  4. Dysmenorrhea Rx: - ibuprofen  (ADVIL ) 800 MG tablet; Take 1 tablet (800 mg total) by mouth every 8 (eight) hours as needed.  Dispense: 30 tablet; Refill: 5  5. Encounter for counseling regarding contraception - patient is abstinent      Plan:    Education reviewed: calcium supplements, depression evaluation, low fat, low cholesterol diet, safe sex/STD prevention, self  breast exams, and weight bearing exercise. Mammogram ordered. Follow up in: 1 year.   Meds ordered this encounter  Medications   ibuprofen  (ADVIL ) 800 MG tablet    Sig: Take 1 tablet (800 mg total) by mouth every 8 (eight) hours as needed.    Dispense:  30 tablet    Refill:  5   Orders Placed This Encounter  Procedures   MM Digital Screening    Standing Status:   Future    Expiration Date:   12/04/2024    Reason for Exam (SYMPTOM  OR DIAGNOSIS REQUIRED):   `    Is the patient pregnant?:   No    Preferred imaging location?:   [facility]   POCT urinalysis dipstick    Gabrielle Joiner, MD, FACOG Attending Obstetrician & Gynecologist, [facility] for [facility], [facility], [place] 06/04/2024

## 2024-06-04 NOTE — Progress Notes (Signed)
 Plans PAP today. Menses lately with increased cramping. STI swab desired. Wants urine checked as well.

## 2024-06-05 LAB — CERVICOVAGINAL ANCILLARY ONLY
Bacterial Vaginitis (gardnerella): NEGATIVE
Candida Glabrata: NEGATIVE
Candida Vaginitis: NEGATIVE
Chlamydia: NEGATIVE
Comment: NEGATIVE
Comment: NEGATIVE
Comment: NEGATIVE
Comment: NEGATIVE
Comment: NEGATIVE
Comment: NORMAL
Neisseria Gonorrhea: NEGATIVE
Trichomonas: NEGATIVE

## 2024-06-10 ENCOUNTER — Ambulatory Visit: Payer: Self-pay | Admitting: Family Medicine

## 2024-06-10 LAB — CYTOLOGY - PAP
Comment: NEGATIVE
Diagnosis: NEGATIVE
High risk HPV: NEGATIVE

## 2024-06-24 ENCOUNTER — Other Ambulatory Visit (HOSPITAL_COMMUNITY)
Admission: RE | Admit: 2024-06-24 | Discharge: 2024-06-24 | Disposition: A | Discharge status: Home / Self Care | Source: Ambulatory Visit | Attending: Obstetrics & Gynecology | Admitting: Obstetrics & Gynecology

## 2024-06-24 ENCOUNTER — Ambulatory Visit: Admitting: Obstetrics & Gynecology

## 2024-06-24 VITALS — BP 138/82 | HR 69 | Wt 165.0 lb

## 2024-06-24 DIAGNOSIS — Z3202 Encounter for pregnancy test, result negative: Secondary | ICD-10-CM

## 2024-06-24 DIAGNOSIS — N898 Other specified noninflammatory disorders of vagina: Secondary | ICD-10-CM | POA: Insufficient documentation

## 2024-06-24 DIAGNOSIS — Z30011 Encounter for initial prescription of contraceptive pills: Secondary | ICD-10-CM | POA: Diagnosis not present

## 2024-06-24 LAB — POCT URINALYSIS DIPSTICK
Bilirubin, UA: NEGATIVE
Blood, UA: NEGATIVE
Glucose, UA: NEGATIVE
Leukocytes, UA: NEGATIVE
Nitrite, UA: NEGATIVE
Protein, UA: POSITIVE — AB
Spec Grav, UA: >=1.03 — AB (ref 1.010–1.025)
Urobilinogen, UA: 0.2 U/dL
pH, UA: 6 (ref 5.0–8.0)

## 2024-06-24 LAB — POCT URINE PREGNANCY: Preg Test, Ur: NEGATIVE

## 2024-06-24 MED ORDER — DROSPIRENONE-ETHINYL ESTRADIOL 3-0.02 MG PO TABS: 1.0000 | ORAL_TABLET | Freq: Every day | ORAL | 11 refills | Status: DC

## 2024-06-24 NOTE — Progress Notes (Signed)
 Patient ID: Valerie Robbins, female   DOB: 12/07/1986, 38 y.o.   MRN: [id number]  Chief Complaint  Patient presents with   Vaginitis    HPI Valerie Robbins is a 38 y.o. female.  [id number] Patient's last menstrual period was 05/31/2024 (exact date). She developed vaginal discharge and perianal irritation since her exam on 6/12 and requests STD screening. She has a new sex partner. She also wants to start OCP HPI  Past Medical History:  Diagnosis Date   Abnormal Pap smear    Anxiety and depression    Asthma    childhood   Atypical chest pain 09/01/2016   ED visit: +chest wall TTP.  D-dimer neg, TnI neg, CXR neg, EKG nl, no hypoxia.   Back pain    Bilateral swelling of feet    Family history of congenital hearing loss    Audiology eval at [facility] ENT normal.   History of uterine fibroid    Joint pain    Metrorrhagia    with pelvic pain.  Transvag+transabd pelvic u/s NORMAL 05/2014.   Peripheral edema    bilat LL's: suspected venous insuff 06/2019; low dose prn lasix  rx'd.   Recurrent genital herpes    HSV 2   SOB (shortness of breath)     Past Surgical History:  Procedure Laterality Date   COLPOSCOPY W/ BIOPSY / CURETTAGE     needs annual paps    Family History  Problem Relation Age of Onset   Anxiety disorder Mother    Hyperlipidemia Mother    Diabetes Mother    Hypertension Mother    Depression Mother    Deafness Mother    Obesity Mother    Anxiety disorder Father    Asthma Father    Alcohol abuse Father    Alcoholism Father    Drug abuse Father    Hearing loss Sister 0       both parents, sister, grandparents were all born deaf   Hypertension Maternal Grandmother    Depression Maternal Grandmother    Diabetes Maternal Grandmother    Anxiety disorder Maternal Grandmother    Deafness Maternal Grandfather     Social History Social History   Tobacco Use   Smoking status: Never   Smokeless tobacco: Never  Vaping Use   Vaping status: Never Used   Substance Use Topics   Alcohol use: Yes    Alcohol/week: 0.0 standard drinks of alcohol    Comment: social    Drug use: No    No Known Allergies  Current Outpatient Medications  Medication Sig Dispense Refill   cyclobenzaprine  (FLEXERIL ) 10 MG tablet Take 1 tablet (10 mg total) by mouth 3 (three) times daily as needed for muscle spasms. 20 tablet 0   FLUoxetine  (PROZAC ) 20 MG capsule Take 1 capsule (20 mg total) by mouth daily. 90 capsule 3   ibuprofen  (ADVIL ) 800 MG tablet Take 1 tablet (800 mg total) by mouth every 8 (eight) hours as needed. 30 tablet 5   LORazepam  (ATIVAN ) 1 MG tablet 1-2 tabs 3 times a day as needed for severe anxiety 90 tablet 5   Multiple Vitamins-Minerals (MULTIVITAMIN WOMEN PO) Take by mouth. One a day Women's     Vitamin D , Ergocalciferol , (DRISDOL ) 1.25 MG (50000 UNIT) CAPS capsule Take 1 capsule (50,000 Units total) by mouth every 7 (seven) days. 12 capsule 3   No current facility-administered medications for this visit.    Review of Systems Review of Systems  Constitutional: Negative.  HENT: Negative.    Respiratory: Negative.    Cardiovascular: Negative.   Gastrointestinal: Negative.   Genitourinary:  Positive for vaginal discharge. Negative for pelvic pain and vaginal bleeding. Genital sores: irritation at the anus.   Blood pressure 138/82, pulse 69, weight 165 lb (74.8 kg), last menstrual period 05/31/2024.  Physical Exam Physical Exam Vitals and nursing note reviewed. Exam conducted with a chaperone present.  Genitourinary:    General: Normal vulva.     Exam position: Lithotomy position.     Vagina: Vaginal discharge present.     Cervix: Normal.     Uterus: Normal. Not tender.      Adnexa: Right adnexa normal and left adnexa normal.      Comments: Few lesions at anus suspicious for HSV but not diagnostic and she asked not to have viral testing    Data Reviewed Pap and DNA probe 06/04/24  Assessment Vaginal discharge - Plan:  Cervicovaginal ancillary only( [place]), POCT urine pregnancy, POCT urinalysis dipstick  Oral contraception initial prescription - Plan: drospirenone-ethinyl estradiol (YAZ) 3-0.02 MG tablet   Plan Will await result of testing Current Meds  Medication Sig   drospirenone-ethinyl estradiol (YAZ) 3-0.02 MG tablet Take 1 tablet by mouth daily.       Lynwood Solomons 06/24/2024, 9:59 AM

## 2024-06-24 NOTE — Progress Notes (Signed)
 Pt is having some vaginal itching/burning.  Would like exam today with urine check.  Pt also request UPT.   UPT in office in Negative and Udip shows +Protein and Ketones.

## 2024-06-25 LAB — CERVICOVAGINAL ANCILLARY ONLY
Bacterial Vaginitis (gardnerella): POSITIVE — AB
Candida Glabrata: NEGATIVE
Candida Vaginitis: NEGATIVE
Chlamydia: NEGATIVE
Comment: NEGATIVE
Comment: NEGATIVE
Comment: NEGATIVE
Comment: NEGATIVE
Comment: NEGATIVE
Comment: NORMAL
Neisseria Gonorrhea: NEGATIVE
Trichomonas: NEGATIVE

## 2024-06-28 ENCOUNTER — Ambulatory Visit: Payer: Self-pay | Admitting: Obstetrics & Gynecology

## 2024-06-28 DIAGNOSIS — N898 Other specified noninflammatory disorders of vagina: Secondary | ICD-10-CM

## 2024-06-28 MED ORDER — METRONIDAZOLE 500 MG PO TABS: 500.0000 mg | ORAL_TABLET | Freq: Two times a day (BID) | ORAL | 0 refills | Status: DC

## 2024-07-08 ENCOUNTER — Other Ambulatory Visit (HOSPITAL_COMMUNITY)
Admission: RE | Admit: 2024-07-08 | Discharge: 2024-07-08 | Disposition: A | Discharge status: Home / Self Care | Source: Ambulatory Visit | Attending: Obstetrics and Gynecology | Admitting: Obstetrics and Gynecology

## 2024-07-08 ENCOUNTER — Ambulatory Visit: Admitting: Obstetrics and Gynecology

## 2024-07-08 ENCOUNTER — Ambulatory Visit: Admitting: Physician Assistant

## 2024-07-08 ENCOUNTER — Encounter: Payer: Self-pay | Admitting: Obstetrics and Gynecology

## 2024-07-08 VITALS — BP 134/84 | HR 77 | Ht 65.0 in | Wt 173.0 lb

## 2024-07-08 DIAGNOSIS — N898 Other specified noninflammatory disorders of vagina: Secondary | ICD-10-CM

## 2024-07-08 DIAGNOSIS — Z113 Encounter for screening for infections with a predominantly sexual mode of transmission: Secondary | ICD-10-CM

## 2024-07-08 LAB — POCT URINALYSIS DIPSTICK
Blood, UA: NEGATIVE
Glucose, UA: NEGATIVE
Ketones, UA: NEGATIVE
Nitrite, UA: NEGATIVE
Protein, UA: POSITIVE — AB
Spec Grav, UA: 1.02 (ref 1.010–1.025)
Urobilinogen, UA: 0.2 U/dL
pH, UA: 6.5 (ref 5.0–8.0)

## 2024-07-08 LAB — POCT URINE PREGNANCY: Preg Test, Ur: NEGATIVE

## 2024-07-08 MED ORDER — METRONIDAZOLE 500 MG PO TABS: 500.0000 mg | ORAL_TABLET | Freq: Two times a day (BID) | ORAL | 0 refills | Status: DC

## 2024-07-08 NOTE — Progress Notes (Signed)
   GYNECOLOGY PROGRESS NOTE  History:  38 y.o. [id number] presents to [facility]. She was seen 7/2 for d/c. Taken flagyl  and finished 2 days ago. Still experiencing symptoms.  Reports she feels like things are swollen, a little irritated.  She reports she has not been sexually active for the past few years and just resumed activity.  She desires to repeat her STD screening    Health Maintenance Due  Topic Date Due   COVID-19 Vaccine (1) Never done   Hepatitis B Vaccines (1 of 3 - 19+ 3-dose series) Never done   HPV VACCINES (1 - 3-dose SCDM series) Never done     Review of Systems:  Pertinent items are noted in HPI.   Objective:  Physical Exam Blood pressure 134/84, pulse 77, height 5' 5 (1.651 m), weight 173 lb (78.5 kg), last menstrual period 06/25/2024. VS reviewed, nursing note reviewed,  Constitutional: well developed, well nourished, no distress HEENT: normocephalic CV: normal rate Pulm/chest wall: normal effort Breast Exam: deferred Abdomen: soft Neuro: alert and oriented x 3 Skin: warm, dry Psych: affect normal Pelvic exam: Cervix pink, visually closed, without lesion,yellow creamy discharge, vaginal walls and external genitalia normal  Assessment & Plan:  1. Vaginal discharge (Primary) 2. Vaginal irritation Discussed continued symptoms and treatment.  Discussed vaginal gel versus antibiotic.  Desires to repeat Flagyl .  If recurrence continues we will switch. discussed preventative measures to include avoiding scented soaps, detergents, incorporating a probiotic, consider condom use if recurrence continues only around sexual intercourse, discussed water flush after intercourse   - metroNIDAZOLE  (FLAGYL ) 500 MG tablet; Take 1 tablet (500 mg total) by mouth 2 (two) times daily. Take 1 tablet by mouth 2 times daily. Avoid alcohol while taking medication  Dispense: 14 tablet; Refill: 0  3. Screen for STD (sexually transmitted disease)  - POCT urine pregnancy -  Cervicovaginal ancillary only( [place]) - POCT Urinalysis Dipstick   Nidia Daring, FNP

## 2024-07-08 NOTE — Progress Notes (Signed)
 Pt states she has finished her flagyl  about 2 days ago, but is still experiencing symptoms, though less.   Got prescription of birth control pills but have not started them yet. Asking for pregnancy test.  Asking to re-take STD test (swab only). Prefers provider does swab, instead of self.

## 2024-07-09 ENCOUNTER — Ambulatory Visit: Admitting: Obstetrics and Gynecology

## 2024-07-09 ENCOUNTER — Other Ambulatory Visit: Payer: Self-pay

## 2024-07-09 ENCOUNTER — Ambulatory Visit: Payer: Self-pay | Admitting: Obstetrics and Gynecology

## 2024-07-09 DIAGNOSIS — N390 Urinary tract infection, site not specified: Secondary | ICD-10-CM

## 2024-07-09 LAB — CERVICOVAGINAL ANCILLARY ONLY
Bacterial Vaginitis (gardnerella): NEGATIVE
Candida Glabrata: NEGATIVE
Candida Vaginitis: NEGATIVE
Chlamydia: NEGATIVE
Comment: NEGATIVE
Comment: NEGATIVE
Comment: NEGATIVE
Comment: NEGATIVE
Comment: NEGATIVE
Comment: NORMAL
Neisseria Gonorrhea: NEGATIVE
Trichomonas: NEGATIVE

## 2024-07-09 MED ORDER — NITROFURANTOIN MONOHYD MACRO 100 MG PO CAPS: 100.0000 mg | ORAL_CAPSULE | Freq: Two times a day (BID) | ORAL | 0 refills | Status: DC

## 2024-07-13 ENCOUNTER — Encounter: Payer: Self-pay | Admitting: Family Medicine

## 2024-07-13 ENCOUNTER — Telehealth: Payer: Self-pay

## 2024-07-13 NOTE — Telephone Encounter (Signed)
 Pt called and stated she has friend that is in need of a pcp and was told there are no openings until Sept. Pt does have some issues to address and will need a get est visit. Please advise if it is ok to schedule pt for Aug.

## 2024-07-14 NOTE — Telephone Encounter (Signed)
 Message already sent to PCP for review, see other encounter.

## 2024-09-08 ENCOUNTER — Other Ambulatory Visit: Payer: Self-pay | Admitting: *Deleted

## 2024-09-08 MED ORDER — VITAFOL ULTRA 29-0.6-0.4-200 MG PO CAPS: 1.0000 | ORAL_CAPSULE | Freq: Every day | ORAL | 11 refills | Status: DC

## 2024-09-08 NOTE — Progress Notes (Signed)
 PNV sent to pharmacy per pt request. Pt had questions about cycle / possible pregnancy.  Pt states her cycle has started a week early and she is typically on time.  Pt wonders if she may have a pregnancy/possible loss.  Pt has had negative at home test but may be too early to detect.  Pt advised to retest in a few days and if still negative keep appt in office next week.

## 2024-09-17 ENCOUNTER — Ambulatory Visit: Admitting: Physician Assistant

## 2024-10-02 ENCOUNTER — Ambulatory Visit: Admitting: Obstetrics

## 2024-10-21 ENCOUNTER — Inpatient Hospital Stay: Admission: RE | Admit: 2024-10-21 | Discharge: 2024-10-21 | Attending: Obstetrics | Admitting: Obstetrics

## 2024-10-21 ENCOUNTER — Ambulatory Visit

## 2024-10-21 DIAGNOSIS — Z1239 Encounter for other screening for malignant neoplasm of breast: Secondary | ICD-10-CM

## 2024-10-22 ENCOUNTER — Ambulatory Visit

## 2024-10-23 ENCOUNTER — Encounter: Payer: Self-pay | Admitting: Emergency Medicine

## 2024-10-23 ENCOUNTER — Ambulatory Visit
Admission: EM | Admit: 2024-10-23 | Discharge: 2024-10-23 | Disposition: A | Discharge status: Home / Self Care | Attending: Emergency Medicine | Admitting: Emergency Medicine

## 2024-10-23 DIAGNOSIS — R1021 Pelvic and perineal pain right side: Secondary | ICD-10-CM | POA: Diagnosis not present

## 2024-10-23 DIAGNOSIS — Z3A01 Less than 8 weeks gestation of pregnancy: Secondary | ICD-10-CM | POA: Diagnosis not present

## 2024-10-23 DIAGNOSIS — R1032 Left lower quadrant pain: Secondary | ICD-10-CM | POA: Diagnosis not present

## 2024-10-23 DIAGNOSIS — O26891 Other specified pregnancy related conditions, first trimester: Secondary | ICD-10-CM | POA: Diagnosis not present

## 2024-10-23 DIAGNOSIS — R1022 Pelvic and perineal pain left side: Secondary | ICD-10-CM | POA: Diagnosis not present

## 2024-10-23 DIAGNOSIS — Z3A00 Weeks of gestation of pregnancy not specified: Secondary | ICD-10-CM | POA: Diagnosis not present

## 2024-10-23 LAB — URINALYSIS, W/ REFLEX TO CULTURE (INFECTION SUSPECTED)
Bilirubin Urine: NEGATIVE
Glucose, UA: NEGATIVE mg/dL
Hgb urine dipstick: NEGATIVE
Ketones, ur: 15 mg/dL — AB
Leukocytes,Ua: NEGATIVE
Nitrite: NEGATIVE
Protein, ur: 30 mg/dL — AB
Specific Gravity, Urine: >1.03 — ABNORMAL HIGH (ref 1.005–1.030)
pH: 6 (ref 5.0–8.0)

## 2024-10-23 MED ORDER — IBUPROFEN 800 MG PO TABS
800.0000 mg | ORAL_TABLET | Freq: Once | ORAL | Status: AC
  Administered 2024-10-23: 800 mg via ORAL

## 2024-10-23 NOTE — Telephone Encounter (Signed)
 Received call from [facility] ED regarding this patient.   38 y/o pt who presented to ED with lower abdominal pain. Ab exam with point tenderness but no gyn symptoms, no vaginal bleeding or discharge. UPT was negative, bhcg 15 on blood draw. GYN consulted for beta evaluation  Discussed most UPTs will turn positive with bhcg > 100. Bhcg 15 is difficult to interpret; could represent extremely early pregnancy but very unlikely. Most likely physiologic. Recommend treating patient as if not pregnant given extremely low chance. No concern for ectopic pregnancy at this itme.   Remainder of care per ED  Plan discussed with Dr. Lowell Lyle Liming, MD

## 2024-10-23 NOTE — ED Provider Notes (Signed)
 [facility]    CSN: [id number] Arrival date & time: 10/23/24  1446      History   Chief Complaint Chief Complaint  Patient presents with   Abdominal Pain    LLQ     HPI Valerie Robbins is a 38 y.o. female.   HPI  37 year old female with past medical history significant for GAD, headache, depression, asthma, anxiety, and uterine fibroids presents for evaluation of left lower quadrant abdominal pain that has been going on for a week and worsened today.  She has not had any fever, nausea, vomiting, diarrhea, constipation, pain with urination, urgency or frequency of urination, blood in her urine or stool.  No vaginal itching or discharge.  She describes it as a severe cramp to the point where it is difficult for her to walk.  Past Medical History:  Diagnosis Date   Abnormal Pap smear    Anxiety and depression    Asthma    childhood   Atypical chest pain 09/01/2016   ED visit: +chest wall TTP.  D-dimer neg, TnI neg, CXR neg, EKG nl, no hypoxia.   Back pain    Bilateral swelling of feet    Family history of congenital hearing loss    Audiology eval at [facility] normal.   History of uterine fibroid    Joint pain    Metrorrhagia    with pelvic pain.  Transvag+transabd pelvic u/s NORMAL 05/2014.   Peripheral edema    bilat LL's: suspected venous insuff 06/2019; low dose prn lasix  rx'd.   Recurrent genital herpes    HSV 2   SOB (shortness of breath)     Patient Active Problem List   Diagnosis Date Noted   Pain in joint of left knee 09/18/2021   GAD (generalized anxiety disorder) 04/06/2019   Spasm of back muscles 09/08/2014   Vaginitis and vulvovaginitis 12/29/2013   Bartholin cyst 09/25/2013   Anxiety associated with depression 06/01/2013   Headache 08/27/2012   Generalized muscle ache 08/27/2012   Depression 01/22/2012    Past Surgical History:  Procedure Laterality Date   COLPOSCOPY W/ BIOPSY / CURETTAGE     needs annual paps    OB History      Gravida  5   Para  3   Term  3   Preterm  0   AB  2   Living  3      SAB  1   IAB  0   Ectopic  1   Multiple  0   Live Births  3            Home Medications    Prior to Admission medications   Medication Sig Start Date End Date Taking? Authorizing Provider  cyclobenzaprine  (FLEXERIL ) 10 MG tablet Take 1 tablet (10 mg total) by mouth 3 (three) times daily as needed for muscle spasms. Patient not taking: Reported on 07/08/2024 04/10/24   Levander Slate, MD  drospirenone -ethinyl estradiol  (YAZ) 3-0.02 MG tablet Take 1 tablet by mouth daily. Patient not taking: Reported on 07/08/2024 06/24/24   Eveline Lynwood MATSU, MD  FLUoxetine  (PROZAC ) 20 MG capsule Take 1 capsule (20 mg total) by mouth daily. 04/21/24   McGowen, Aleene DEL, MD  ibuprofen  (ADVIL ) 800 MG tablet Take 1 tablet (800 mg total) by mouth every 8 (eight) hours as needed. 06/04/24   Rudy Carlin LABOR, MD  LORazepam  (ATIVAN ) 1 MG tablet 1-2 tabs 3 times a day as needed for severe anxiety 04/21/24  McGowen, Aleene DEL, MD  Vitamin D , Ergocalciferol , (DRISDOL ) 1.25 MG (50000 UNIT) CAPS capsule Take 1 capsule (50,000 Units total) by mouth every 7 (seven) days. Patient not taking: Reported on 07/08/2024 09/02/23   McGowen, Aleene DEL, MD    Family History Family History  Problem Relation Age of Onset   Anxiety disorder Mother    Hyperlipidemia Mother    Diabetes Mother    Hypertension Mother    Depression Mother    Deafness Mother    Obesity Mother    Anxiety disorder Father    Asthma Father    Alcohol abuse Father    Alcoholism Father    Drug abuse Father    Hearing loss Sister 0       both parents, sister, grandparents were all born deaf   Hypertension Maternal Grandmother    Depression Maternal Grandmother    Diabetes Maternal Grandmother    Anxiety disorder Maternal Grandmother    Deafness Maternal Grandfather     Social History Social History   Tobacco Use   Smoking status: Never   Smokeless tobacco: Never   Vaping Use   Vaping status: Never Used  Substance Use Topics   Alcohol use: Yes    Alcohol/week: 0.0 standard drinks of alcohol    Comment: social    Drug use: No     Allergies   Patient has no known allergies.   Review of Systems Review of Systems  Constitutional:  Negative for fever.  Gastrointestinal:  Positive for abdominal pain. Negative for blood in stool, constipation, diarrhea, nausea and vomiting.  Genitourinary:  Negative for dysuria, frequency, hematuria, urgency, vaginal discharge and vaginal pain.  Musculoskeletal:  Negative for back pain.     Physical Exam Triage Vital Signs ED Triage Vitals  Encounter Vitals Group     BP      Girls Systolic BP Percentile      Girls Diastolic BP Percentile      Boys Systolic BP Percentile      Boys Diastolic BP Percentile      Pulse      Resp      Temp      Temp src      SpO2      Weight      Height      Head Circumference      Peak Flow      Pain Score      Pain Loc      Pain Education      Exclude from Growth Chart    No data found.  Updated Vital Signs BP 124/78 (BP Location: Left Arm)   Pulse 83   Temp 99.1 F (37.3 C) (Oral)   Resp 14   Ht 5' 5 (1.651 m)   Wt 173 lb 1 oz (78.5 kg)   LMP 10/02/2024   SpO2 96%   BMI 28.80 kg/m   Visual Acuity Right Eye Distance:   Left Eye Distance:   Bilateral Distance:    Right Eye Near:   Left Eye Near:    Bilateral Near:     Physical Exam Vitals and nursing note reviewed.  Constitutional:      Appearance: Normal appearance. She is not ill-appearing.  HENT:     Head: Normocephalic and atraumatic.  Abdominal:     General: Abdomen is flat.     Palpations: Abdomen is soft.     Tenderness: There is abdominal tenderness. There is no guarding or rebound.  Skin:  General: Skin is warm and dry.     Capillary Refill: Capillary refill takes less than 2 seconds.     Findings: No rash.  Neurological:     General: No focal deficit present.     Mental  Status: She is alert and oriented to person, place, and time.      UC Treatments / Results  Labs (all labs ordered are listed, but only abnormal results are displayed) Labs Reviewed  URINALYSIS, W/ REFLEX TO CULTURE (INFECTION SUSPECTED) - Abnormal; Notable for the following components:      Result Value   Specific Gravity, Urine >1.030 (*)    Ketones, ur 15 (*)    Protein, ur 30 (*)    Bacteria, UA FEW (*)    All other components within normal limits    EKG   Radiology No results found.  Procedures Procedures (including critical care time)  Medications Ordered in UC Medications  ibuprofen  (ADVIL ) tablet 800 mg (has no administration in time range)    Initial Impression / Assessment and Plan / UC Course  I have reviewed the triage vital signs and the nursing notes.  Pertinent labs & imaging results that were available during my care of the patient were reviewed by me and considered in my medical decision making (see chart for details).   Patient is a pleasant, nontoxic-appearing 38 year old female presenting for evaluation of left lower quadrant abdominal pain.  No other associated GI or urinary symptoms.  Also denies vaginal discharge or itching.  Her last menstrual cycle was on 10/02/2024.  She denies any history of ovarian cysts though her mother had both ovarian cysts and also uterine fibroids.  Patient has a documented history of uterine fibroid.  She reports that her periods have becoming longer in duration and heavier.  In the exam room her abdomen is soft and flat but she is markedly tender in the left lower quadrant of her abdomen.  I will obtain a urinalysis to assess for the presence of possible UTI.  Other differential diagnoses include diverticulitis, renal stone, and ovarian cyst.  Renal stone being less likely given that the patient has no document history of urinary stones and no history of blood in the urine.  Urinalysis shows high specific gravity with 30  protein and 15 ketones.  Negative for leukocyte esterase or nitrates.  Also negative for hemoglobin.  Reflex microscopy shows skin cell contamination with 6-10 squamous epithelials and 6-10 WBCs.  Few bacteria and mucus are present.   Final Clinical Impressions(s) / UC Diagnoses   Final diagnoses:  Left lower quadrant abdominal pain     Discharge Instructions      As we discussed, you are having pain in the left lower quadrant of your abdomen and you do have a mildly elevated temp but not quite a true fever here in urgent care.  Your urinalysis does not show any evidence of urinary tract infection.  Other possibilities include ovarian cyst and diverticulitis.  You need a CT scan to evaluate for diverticulitis and a pelvic ultrasound to evaluate for an ovarian cyst, which we cannot provide.  Urgent care.     ED Prescriptions   None    PDMP not reviewed this encounter.   Bernardino Ditch, NP 10/23/24 1520

## 2024-10-23 NOTE — ED Provider Notes (Signed)
 [facility] Emergency Department Provider Note   ED Clinical Impression   Final diagnoses:  None    ED Course   Impression: 38 y.o. female  who presents with abdominal pain as described below.   Diagnostic workup as below.   Orders Placed This Encounter  Procedures  . Urine Culture  . Chlamydia/Gonorrhoeae NAA  . Mycoplasma genitalium NAA  . TRICHOMONAS NAAT  . Vaginitis Molecular Panel  . CBC w/ Differential  . Comprehensive metabolic panel  . Lipase  . hCG, Quantitative, Pregnancy  . Urinalysis with Microscopy with Culture Reflex  . Pregnancy Qualitative, Urine  . HIV Antigen/Antibody Combo  . Syphilis Screen  . Inpatient consult to OB  . US  OB Transvaginal      6:00 PM Called radiology and asked which study would rule out both ovarian tosion and ectopic. They recommend OB transvaginal US , will order.    History   Chief Complaint Chief Complaint  Patient presents with  . Abdominal Pain    HPI  Valerie Robbins is a 38 y.o. female with past medical history as below who presents with abdominal pain. The patient reports 1 week of left-sided suprapubic abdominal cramping with associated generalized weakness and with radiation toward her bilateral lower back. Furthermore, she notes that her pain has been worse yesterday and today, making her hunch over due to the pain. Upon interview, she feels there is a chance of pregnancy. Her LMP was on 10/02/24. She notes she has had 3 kids but has not had any similar pain before. She mentions that her mother had fibroids and a uterine cyst which led to her getting a hysterectomy. No abdominal surgical history. Denies vaginal bleeding or discharge. Further denies recent fevers, hematuria, hematochezia, or any urinary/bowel movement changes.   Past Medical History[1]  Past Surgical History[2]  Active Medications[3]   Allergies[4]  Family History[5]  Short Social History[6]    Physical Exam   VITAL SIGNS:     Vitals:   10/23/24 1629 10/23/24 2040  BP: 127/86 (S) 149/81  Pulse: 69 (S) 69  Resp: 16 (S) 16  Temp: 37.3 C (99.1 F) (S) 36.7 C (98 F)  TempSrc:  Oral  SpO2: 100% (S) 100%  Weight: 73.3 kg (161 lb 11.2 oz)    Constitutional: Alert and oriented. No acute distress. Eyes: Conjunctivae are normal. HEENT: Normocephalic and atraumatic. Conjunctivae clear. No congestion. Moist mucous membranes.  Cardiovascular: Rate as above, regular rhythm. Normal and symmetric distal pulses. Brisk capillary refill. Normal skin turgor. Respiratory: Normal respiratory effort. Breath sounds are normal. There are no wheezing or crackles heard. Gastrointestinal: Soft, non-distended. LLQ/pelvic tenderness. Genitourinary: Deferred. Musculoskeletal: Non-tender with normal range of motion in all extremities.  Neurologic: Normal speech and language. No gross focal neurologic deficits are appreciated. Patient is moving all extremities equally, face is symmetric at rest and with speech. Skin: Skin is warm, dry and intact. No rash noted. Psychiatric: Mood and affect are normal. Speech and behavior are normal.  ROS: With the exception of what is documented in the HPI above, a 10 point ROS is otherwise unremarkable.  Medical Decision Making   MDM:  Patient is a 38 y.o. female with PMH of asthma, uterine fibroids, GAD, and depression presenting for 1 week of left-sided suprapubic abdominal cramping with associated generalized weakness and with radiation toward her bilateral lower back.   Physical exam notable for LLQ/pelvic tenderness.  Patient was ordered a CBC, CMP, lipase, urine pregnancy, UA.  CBC and CMP were largely  unremarkable.  Lipase within normal limit.  Urine pregnancy is negative, however, hCG quantitative pregnancy reveals a result of 15.6.  I then called the radiology reading room to discuss which ultrasound study would be most beneficial in assessing for ectopic versus ovarian torsion given  inconclusive laboratory results and they recommended an OB transvaginal ultrasound study.  Patient's pain will be managed with Tylenol .  Ultrasound reveals the following per radiology: No definite intrauterine pregnancy identified.  Given positive pregnancy test, this represents a pregnancy of unknown location. Differential diagnosis includes early intrauterine pregnancy, pregnancy loss, and ectopic pregnancy. Recommend clinical correlation, serial quantitative beta HCGs, ectopic precautions, and follow-up ultrasound as clinically appropriate.        Clinical management and follow up per obstetrical team, which can include ectopic precautions, trending of hCG, and follow up ultrasound as indicated.   I have consulted OB/GYN regarding the aforementioned ultrasound results at 10:20 PM.  I spoke with Dr. Jannetta with OB/GYN at approximately 11:22 PM.  She informed me that OB/GYN had no concerns that the patient was pregnant with an hCG that low.  Patient states she has an OB/GYN that she can follow-up with.  Patient also states she has a PCP.  I have discussed with patient and recommended that she receive a CT scan of her abdomen and pelvis due to her left lower quadrant abdominal pain but after hearing the risks and benefits explained patient has respectfully declined CT imaging at this time stating she would like to go home immediately.  I will give the patient oxycodone  here in the emergency department to treat her pain and write her a short course of oxycodone  medication for her pain.  I will also start the patient on a course of Augmentin  for empiric treatment for presumptive diverticulitis.  Patient's CBC and CMP and lipase are all very reassuring and her UA is negative.  Patient instructed to follow-up with PCP and OB/GYN as soon as possible, given strict return precautions, agrees with plan, verbalized understanding, and request to be discharged.   Radiology   US  OB Transvaginal  Final Result     No definite intrauterine pregnancy identified.  Given positive pregnancy test, this represents a pregnancy of unknown location. Differential diagnosis includes early intrauterine pregnancy, pregnancy loss, and ectopic pregnancy. Recommend clinical correlation, serial quantitative beta HCGs, ectopic precautions, and follow-up ultrasound as clinically appropriate.        Clinical management and follow up per obstetrical team, which can include ectopic precautions, trending of hCG, and follow up ultrasound as indicated.      Please note that this examination was not performed for purposes of assessing fetal anatomy and/or the placenta and is not diagnostic for these purposes. This is not a surrogate for and should not replace dedicated fetal anatomy scan.      Please see below for data measurements:      LMP: 10/02/24  HCG 15   Endovaginal consent? As per sonographer, the endovaginal pelvic procedure was explained to the patient and the patient verbally consented to the exam.    Uterus: Sagittal 9.7 cm; AP 5.0 cm; Transverse 6.1 cm  Endometrium: 0.94 cm  Right ovary: Sagittal 2.9 cm; AP 2.1 cm; Transverse 2.6 cm  Left ovary: Sagittal 2.7 cm; AP 1.8 cm; Transverse 2.6 cm    Free fluid visualized: yes                 Pertinent labs & imaging results that were available during my care  of the patient were independently interpreted by me and considered in my medical decision making (see chart for details).  Portions of this record have been created using Scientist, clinical (histocompatibility and immunogenetics). Dictation errors have been sought, but may not have been identified and corrected.  Documentation assistance was provided by Devaughn Hicks, Scribe on October 23, 2024 at 5:49 PM for Elsie Beams, NP.         [1] No past medical history on file. [2] No past surgical history on file. [3] No current facility-administered medications for this encounter.   No current outpatient medications on  file.  [4] No Known Allergies [5] No family history on file. [6]    Beams Elsie BROCKS, FNP 10/23/24 [phone]

## 2024-10-23 NOTE — ED Triage Notes (Signed)
 Patient c/o LLQ abdominal pain that started yesterday and got worse today.  Patient denies N/V/D.  Patient denies urinary symptoms.  Patient denies vaginal discharge.  Patient reports last bowel movement this morning and was normal.  Patient denies fevers.

## 2024-10-23 NOTE — ED Notes (Signed)
 Patient is being discharged from the Urgent Care and sent to the Emergency Department via Personal Vehicle . Per Harriette, NP, patient is in need of higher level of care due to Abdominal Pain. Patient is aware and verbalizes understanding of plan of care.  Vitals:   10/23/24 1500  BP: 124/78  Pulse: 83  Resp: 14  Temp: 99.1 F (37.3 C)  SpO2: 96%

## 2024-10-23 NOTE — Discharge Instructions (Addendum)
 As we discussed, you are having pain in the left lower quadrant of your abdomen and you do have a mildly elevated temp but not quite a true fever here in urgent care.  Your urinalysis does not show any evidence of urinary tract infection.  Other possibilities include ovarian cyst and diverticulitis.  You need a CT scan to evaluate for diverticulitis and a pelvic ultrasound to evaluate for an ovarian cyst, which we cannot provide.  Urgent care.

## 2024-10-26 ENCOUNTER — Ambulatory Visit

## 2024-10-26 ENCOUNTER — Ambulatory Visit: Admitting: Obstetrics and Gynecology

## 2024-10-26 DIAGNOSIS — O3680X0 Pregnancy with inconclusive fetal viability, not applicable or unspecified: Secondary | ICD-10-CM | POA: Diagnosis not present

## 2024-10-27 DIAGNOSIS — O3680X0 Pregnancy with inconclusive fetal viability, not applicable or unspecified: Secondary | ICD-10-CM | POA: Diagnosis not present

## 2024-10-28 DIAGNOSIS — O3680X0 Pregnancy with inconclusive fetal viability, not applicable or unspecified: Secondary | ICD-10-CM | POA: Diagnosis not present

## 2024-10-30 DIAGNOSIS — O3680X0 Pregnancy with inconclusive fetal viability, not applicable or unspecified: Secondary | ICD-10-CM | POA: Diagnosis not present

## 2024-10-31 DIAGNOSIS — O26899 Other specified pregnancy related conditions, unspecified trimester: Secondary | ICD-10-CM | POA: Diagnosis not present

## 2024-10-31 DIAGNOSIS — Z3A01 Less than 8 weeks gestation of pregnancy: Secondary | ICD-10-CM | POA: Diagnosis not present

## 2024-10-31 DIAGNOSIS — O26891 Other specified pregnancy related conditions, first trimester: Secondary | ICD-10-CM | POA: Diagnosis not present

## 2024-10-31 DIAGNOSIS — O2341 Unspecified infection of urinary tract in pregnancy, first trimester: Secondary | ICD-10-CM | POA: Diagnosis not present

## 2024-10-31 DIAGNOSIS — B9689 Other specified bacterial agents as the cause of diseases classified elsewhere: Secondary | ICD-10-CM | POA: Diagnosis not present

## 2024-11-05 ENCOUNTER — Ambulatory Visit: Admitting: Family Medicine

## 2024-11-05 NOTE — Progress Notes (Deleted)
 OFFICE VISIT  11/05/2024  CC: No chief complaint on file.   Patient is a 38 y.o. female who presents for abdominal pain and +serum HCG.  HPI: ***  Past Medical History:  Diagnosis Date   Abnormal Pap smear    Anxiety and depression    Asthma    childhood   Atypical chest pain 09/01/2016   ED visit: +chest wall TTP.  D-dimer neg, TnI neg, CXR neg, EKG nl, no hypoxia.   Back pain    Bilateral swelling of feet    Family history of congenital hearing loss    Audiology eval at [facility] normal.   History of uterine fibroid    Joint pain    Metrorrhagia    with pelvic pain.  Transvag+transabd pelvic u/s NORMAL 05/2014.   Peripheral edema    bilat LL's: suspected venous insuff 06/2019; low dose prn lasix  rx'd.   Recurrent genital herpes    HSV 2   SOB (shortness of breath)     Past Surgical History:  Procedure Laterality Date   COLPOSCOPY W/ BIOPSY / CURETTAGE     needs annual paps    Outpatient Medications Prior to Visit  Medication Sig Dispense Refill   cyclobenzaprine  (FLEXERIL ) 10 MG tablet Take 1 tablet (10 mg total) by mouth 3 (three) times daily as needed for muscle spasms. (Patient not taking: Reported on 07/08/2024) 20 tablet 0   drospirenone -ethinyl estradiol  (YAZ) 3-0.02 MG tablet Take 1 tablet by mouth daily. (Patient not taking: Reported on 07/08/2024) 28 tablet 11   FLUoxetine  (PROZAC ) 20 MG capsule Take 1 capsule (20 mg total) by mouth daily. 90 capsule 3   ibuprofen  (ADVIL ) 800 MG tablet Take 1 tablet (800 mg total) by mouth every 8 (eight) hours as needed. 30 tablet 5   LORazepam  (ATIVAN ) 1 MG tablet 1-2 tabs 3 times a day as needed for severe anxiety 90 tablet 5   Vitamin D , Ergocalciferol , (DRISDOL ) 1.25 MG (50000 UNIT) CAPS capsule Take 1 capsule (50,000 Units total) by mouth every 7 (seven) days. (Patient not taking: Reported on 07/08/2024) 12 capsule 3   No facility-administered medications prior to visit.    No Known Allergies  Review of Systems  As  per HPI  PE:    10/23/2024    3:00 PM 10/23/2024    2:58 PM 07/08/2024    2:22 PM  Vitals with BMI  Height  5' 5 5' 5  Weight  173 lbs 1 oz 173 lbs  BMI  28.8 28.79  Systolic 124  134  Diastolic 78  84  Pulse 83  77     Physical Exam  ***  LABS:  Last CBC Lab Results  Component Value Date   WBC 4.4 08/20/2023   HGB 12.9 08/20/2023   HCT 40.7 08/20/2023   MCV 91.6 08/20/2023   MCH 29.5 09/01/2016   RDW 13.4 08/20/2023   PLT 371.0 08/20/2023   Last metabolic panel Lab Results  Component Value Date   GLUCOSE 82 08/20/2023   NA 140 08/20/2023   K 4.4 08/20/2023   CL 106 08/20/2023   CO2 25 08/20/2023   BUN 15 08/20/2023   CREATININE 0.96 08/20/2023   GFR 74.88 08/20/2023   CALCIUM 9.2 08/20/2023   PROT 6.9 08/20/2023   ALBUMIN 4.1 08/20/2023   BILITOT 0.4 08/20/2023   ALKPHOS 38 (L) 08/20/2023   AST 26 08/20/2023   ALT 34 08/20/2023   ANIONGAP 6 09/01/2016   Serum quantitative beta-hCG on 10/28/2024  was 187, rose to 401 on 10/30/2024, and was 697 on 10/31/2024 Urine GC chlamydia negative on 10/31/2024.  Urine culture showed multiple species of questionable clinical significance on the same date.  IMPRESSION AND PLAN:  No problem-specific Assessment & Plan notes found for this encounter.   An After Visit Summary was printed and given to the patient.  FOLLOW UP: No follow-ups on file.  Signed:  Gerlene Hockey, MD           11/05/2024

## 2024-11-06 ENCOUNTER — Encounter: Payer: Self-pay | Admitting: Family Medicine

## 2024-11-08 ENCOUNTER — Emergency Department (HOSPITAL_BASED_OUTPATIENT_CLINIC_OR_DEPARTMENT_OTHER)

## 2024-11-08 ENCOUNTER — Encounter (HOSPITAL_BASED_OUTPATIENT_CLINIC_OR_DEPARTMENT_OTHER): Payer: Self-pay

## 2024-11-08 ENCOUNTER — Other Ambulatory Visit: Payer: Self-pay

## 2024-11-08 ENCOUNTER — Emergency Department (HOSPITAL_BASED_OUTPATIENT_CLINIC_OR_DEPARTMENT_OTHER)
Admission: EM | Admit: 2024-11-08 | Discharge: 2024-11-08 | Disposition: A | Discharge status: Home / Self Care | Attending: Emergency Medicine | Admitting: Emergency Medicine

## 2024-11-08 DIAGNOSIS — Z3A01 Less than 8 weeks gestation of pregnancy: Secondary | ICD-10-CM | POA: Diagnosis not present

## 2024-11-08 DIAGNOSIS — R748 Abnormal levels of other serum enzymes: Secondary | ICD-10-CM | POA: Insufficient documentation

## 2024-11-08 DIAGNOSIS — E871 Hypo-osmolality and hyponatremia: Secondary | ICD-10-CM | POA: Diagnosis not present

## 2024-11-08 DIAGNOSIS — R102 Pelvic and perineal pain unspecified side: Secondary | ICD-10-CM | POA: Insufficient documentation

## 2024-11-08 DIAGNOSIS — O26891 Other specified pregnancy related conditions, first trimester: Secondary | ICD-10-CM | POA: Diagnosis not present

## 2024-11-08 DIAGNOSIS — R1032 Left lower quadrant pain: Secondary | ICD-10-CM | POA: Diagnosis not present

## 2024-11-08 DIAGNOSIS — Z3A11 11 weeks gestation of pregnancy: Secondary | ICD-10-CM | POA: Diagnosis not present

## 2024-11-08 DIAGNOSIS — O09511 Supervision of elderly primigravida, first trimester: Secondary | ICD-10-CM | POA: Diagnosis not present

## 2024-11-08 LAB — URINALYSIS, ROUTINE W REFLEX MICROSCOPIC
Bacteria, UA: NONE SEEN
Bilirubin Urine: NEGATIVE
Glucose, UA: NEGATIVE mg/dL
Hgb urine dipstick: NEGATIVE
Ketones, ur: 15 mg/dL — AB
Nitrite: NEGATIVE
Protein, ur: NEGATIVE mg/dL
Specific Gravity, Urine: 1.029 (ref 1.005–1.030)
pH: 5.5 (ref 5.0–8.0)

## 2024-11-08 LAB — CBC
HCT: 34.2 % — ABNORMAL LOW (ref 36.0–46.0)
Hemoglobin: 11.3 g/dL — ABNORMAL LOW (ref 12.0–15.0)
MCH: 30.4 pg (ref 26.0–34.0)
MCHC: 33 g/dL (ref 30.0–36.0)
MCV: 91.9 fL (ref 80.0–100.0)
Platelets: 327 K/uL (ref 150–400)
RBC: 3.72 MIL/uL — ABNORMAL LOW (ref 3.87–5.11)
RDW: 13.1 % (ref 11.5–15.5)
WBC: 5.6 K/uL (ref 4.0–10.5)
nRBC: 0 % (ref 0.0–0.2)

## 2024-11-08 LAB — COMPREHENSIVE METABOLIC PANEL WITH GFR
ALT: 37 U/L (ref 0–44)
AST: 35 U/L (ref 15–41)
Albumin: 4.1 g/dL (ref 3.5–5.0)
Alkaline Phosphatase: 35 U/L — ABNORMAL LOW (ref 38–126)
Anion gap: 9 (ref 5–15)
BUN: 12 mg/dL (ref 6–20)
CO2: 23 mmol/L (ref 22–32)
Calcium: 8.9 mg/dL (ref 8.9–10.3)
Chloride: 103 mmol/L (ref 98–111)
Creatinine, Ser: 0.81 mg/dL (ref 0.44–1.00)
GFR, Estimated: >60 mL/min (ref >60)
Glucose, Bld: 73 mg/dL (ref 70–99)
Potassium: 4.2 mmol/L (ref 3.5–5.1)
Sodium: 134 mmol/L — ABNORMAL LOW (ref 135–145)
Total Bilirubin: 0.4 mg/dL (ref 0.0–1.2)
Total Protein: 7.1 g/dL (ref 6.5–8.1)

## 2024-11-08 LAB — PREGNANCY, URINE: Preg Test, Ur: POSITIVE — AB

## 2024-11-08 LAB — LIPASE, BLOOD: Lipase: 54 U/L — ABNORMAL HIGH (ref 11–51)

## 2024-11-08 LAB — HCG, QUANTITATIVE, PREGNANCY: hCG, Beta Chain, Quant, S: 11930 m[IU]/mL — ABNORMAL HIGH (ref <5)

## 2024-11-08 MED ORDER — ACETAMINOPHEN 500 MG PO TABS
1000.0000 mg | ORAL_TABLET | Freq: Once | ORAL | Status: AC
  Administered 2024-11-08: 1000 mg via ORAL
  Filled 2024-11-08: qty 2

## 2024-11-08 NOTE — ED Triage Notes (Addendum)
 Arrives POV with complaints of lower abdominal cramping for several days. Patient reports that she is also pregnant. She's been having her HCG labs checked but they were low. She was sent here for another Transvaginal Ultrasound and labs. No vaginal bleeding

## 2024-11-08 NOTE — ED Notes (Signed)
 Patient transported to [place]

## 2024-11-08 NOTE — ED Provider Notes (Signed)
 [place] EMERGENCY DEPARTMENT AT [facility] Provider Note   CSN: [id number] Arrival date & time: 11/08/24  1439     Patient presents with: Abdominal Cramping   Valerie Robbins is a 38 y.o. female with no significant past medical history presents with concern for lower pelvic pain that has been ongoing for about the past 2 weeks.  Pain originally started on the left lower side of her abdomen, but it seems to have spread to the right lower side as well.  She reports she had a positive pregnancy test at the end of October and her OB/GYN has been monitoring her beta-hCG levels which have been rising appropriately.  However, she was recommended to come to the ER to confirm location of pregnancy due to her ongoing pain.  She denies any nausea, vomiting, fever, chills.  Denies any dysuria, hematuria, increase frequency.  Denies any vaginal bleeding or abnormal vaginal discharge.  She reports normal bowel movements.   Last menstrual period was October 10    Abdominal Cramping Associated symptoms include abdominal pain.       Prior to Admission medications   Medication Sig Start Date End Date Taking? Authorizing Provider  cyclobenzaprine  (FLEXERIL ) 10 MG tablet Take 1 tablet (10 mg total) by mouth 3 (three) times daily as needed for muscle spasms. Patient not taking: Reported on 07/08/2024 04/10/24   Levander Slate, MD  drospirenone -ethinyl estradiol  (YAZ) 3-0.02 MG tablet Take 1 tablet by mouth daily. Patient not taking: Reported on 07/08/2024 06/24/24   Eveline Lynwood MATSU, MD  FLUoxetine  (PROZAC ) 20 MG capsule Take 1 capsule (20 mg total) by mouth daily. 04/21/24   McGowen, Aleene DEL, MD  ibuprofen  (ADVIL ) 800 MG tablet Take 1 tablet (800 mg total) by mouth every 8 (eight) hours as needed. 06/04/24   Rudy Carlin LABOR, MD  LORazepam  (ATIVAN ) 1 MG tablet 1-2 tabs 3 times a day as needed for severe anxiety 04/21/24   McGowen, Aleene DEL, MD  Vitamin D , Ergocalciferol , (DRISDOL ) 1.25 MG (50000  UNIT) CAPS capsule Take 1 capsule (50,000 Units total) by mouth every 7 (seven) days. Patient not taking: Reported on 07/08/2024 09/02/23   McGowen, Aleene DEL, MD    Allergies: Patient has no known allergies.    Review of Systems  Constitutional:  Negative for fever.  Gastrointestinal:  Positive for abdominal pain.    Updated Vital Signs BP 134/81   Pulse 75   Temp 99.5 F (37.5 C) (Oral)   Resp 20   LMP 10/02/2024   SpO2 99%   Physical Exam Vitals and nursing note reviewed.  Constitutional:      General: She is not in acute distress.    Appearance: She is well-developed.  HENT:     Head: Normocephalic and atraumatic.  Eyes:     Conjunctiva/sclera: Conjunctivae normal.  Cardiovascular:     Rate and Rhythm: Normal rate and regular rhythm.     Heart sounds: No murmur heard. Pulmonary:     Effort: Pulmonary effort is normal. No respiratory distress.     Breath sounds: Normal breath sounds.  Abdominal:     Palpations: Abdomen is soft.     Tenderness: There is no abdominal tenderness.     Comments: No significant abdominal tenderness to palpation.  Musculoskeletal:        General: No swelling.     Cervical back: Neck supple.  Skin:    General: Skin is warm and dry.     Capillary Refill: Capillary refill takes  less than 2 seconds.  Neurological:     Mental Status: She is alert.  Psychiatric:        Mood and Affect: Mood normal.     (all labs ordered are listed, but only abnormal results are displayed) Labs Reviewed  LIPASE, BLOOD - Abnormal; Notable for the following components:      Result Value   Lipase 54 (*)    All other components within normal limits  COMPREHENSIVE METABOLIC PANEL WITH GFR - Abnormal; Notable for the following components:   Sodium 134 (*)    Alkaline Phosphatase 35 (*)    All other components within normal limits  CBC - Abnormal; Notable for the following components:   RBC 3.72 (*)    Hemoglobin 11.3 (*)    HCT 34.2 (*)    All other  components within normal limits  URINALYSIS, ROUTINE W REFLEX MICROSCOPIC - Abnormal; Notable for the following components:   Ketones, ur 15 (*)    Leukocytes,Ua MODERATE (*)    All other components within normal limits  PREGNANCY, URINE - Abnormal; Notable for the following components:   Preg Test, Ur POSITIVE (*)    All other components within normal limits  HCG, QUANTITATIVE, PREGNANCY - Abnormal; Notable for the following components:   hCG, Beta Chain, Quant, S 11,930 (*)    All other components within normal limits    EKG: None  Radiology: US  OB LESS THAN 14 WEEKS WITH OB TRANSVAGINAL Result Date: 11/08/2024 EXAM: OBSTETRIC ULTRASOUND FIRST TRIMESTER TECHNIQUE: Transvaginal first trimester obstetric pelvic duplex ultrasound was performed with real-time imaging, color flow Doppler imaging, and spectral analysis. COMPARISON: None provided. CLINICAL HISTORY: Pelvic pain during pregnancy. FINDINGS: UTERUS: No focal myometrial mass. GESTATIONAL SAC(S): Single normal appearing intrauterine gestational sac. Mean sac diameter 1.1 cm corresponding to 5 weeks 6 days. No subchorionic hemorrhage. YOLK SAC: Present EMBRYO(<11WK) /FETUS(>=11WK): No fetal pole. CROWN RUMP LENGTH: Not measured. RATE OF CARDIAC ACTIVITY: No fetal heart motion. RIGHT OVARY: Unremarkable. LEFT OVARY: Unremarkable. FREE FLUID: No free fluid. MEASUREMENTS ESTIMATED GESTATIONAL AGE BY CURRENT ULTRASOUND: 5 weeks 6 days. ESTIMATED GESTATIONAL AGE BY LMP/PRIOR ULTRASOUND: Not provided. ESTIMATED DUE DATE: 07/05/2025. IMPRESSION: 1. Intrauterine gestational sac corresponding to 5 weeks 6 days. 2. No fetal pole or fetal heart motion identified. Electronically signed by: Fonda Field MD 11/08/2024 06:16 PM EST RP Workstation: [id number]     Procedures   Medications Ordered in the ED  acetaminophen  (TYLENOL ) tablet 1,000 mg (1,000 mg Oral Given 11/08/24 1805)                                    Medical Decision  Making Amount and/or Complexity of Data Reviewed Labs: ordered. Radiology: ordered.  Risk OTC drugs.     Differential diagnosis includes but is not limited to acute cholecystitis, cholelithiasis, cholangitis, choledocholithiasis, peptic ulcer, gastritis, gastroenteritis, appendicitis, IBS, IBD, DKA, nephrolithiasis, UTI, pyelonephritis, pancreatitis, diverticulitis, mesenteric ischemia, abdominal aortic aneurysm, small bowel obstruction, volvulus,  ovarian torsion and pregnancy related concerns in females of childbearing age    ED Course:  Upon initial evaluation, patient is very well-appearing, no acute distress.  Normal vital signs.  Abdomen is soft nontender.  Patient was given Tylenol  upon arrival for pain.  Labs Ordered: I Ordered, and personally interpreted labs.  The pertinent results include:   CBC without leukocytosis.  Hemoglobin 11.3 CMP with mild hyponatremia 134.  No elevations in LFTs or  creatinine Lipase mildly elevated 54 Urine pregnancy negative.  Quantitative hCG elevated at 11,930 Urinalysis with moderate amount of leukocytes, no bacteria noted on reflex  Imaging Studies ordered: I ordered imaging studies including transvaginal ultrasound I independently visualized the imaging with scope of interpretation limited to determining acute life threatening conditions related to emergency care. Imaging showed  IMPRESSION:  1. Intrauterine gestational sac corresponding to 5 weeks 6 days.  2. No fetal pole or fetal heart motion identified.   I agree with the radiologist interpretation  Medications Given: Tylenol   Upon re-evaluation, patient remains well-appearing with pain well-controlled.  Her ultrasound today shows a intrauterine gestational sac corresponding with a pregnancy of 5 weeks and 6 days.  No fetal pole or heart motion was identified today.  I discussed this finding with the patient.  We will have her follow-up with her OB within the next week for repeat  beta-hCG and for further ultrasound imaging to ensure pregnancy is progressing appropriately.  No signs of ectopic pregnancy on her ultrasound. I have low concern for other acute intra-abdominal pathology at this time.  Her abdomen is soft and nontender on exam today.  Patient has stable vitals.  Patient notes more left lower quadrant pain, I doubt appendicitis especially since she is nontender to the right lower quadrant on my exam.  She does not have any leukocytosis, fever, or tachycardia to suggest systemic infection or sepsis.  She does not have any elevations in LFTs or creatinine, lipase only mildly elevated at 54.  No concern for pancreatitis at this lipase value, and patient not having epigastric pain.  Urinalysis shows moderate amount of leukocytes, but negative for nitrites.  No bacteria were noted on urinalysis.  Patient without any urinary symptoms, low concern for UTI or pyelonephritis.  Patient having normal bowel movements, low concern for SBO.  Patient stable and appropriate for discharge home  Impression: Intrauterine pregnancy  Disposition:  The patient was discharged home with instructions to follow-up with OB within the next week for further management of her pregnancy.  Take daily multivitamin.  May take Tylenol  for pain.  Do not take NSAIDs for pain. Return precautions given and patient verbalized understanding.     This chart was dictated using voice recognition software, Dragon. Despite the best efforts of this provider to proofread and correct errors, errors may still occur which can change documentation meaning.       Final diagnoses:  Less than [redacted] weeks gestation of pregnancy    ED Discharge Orders     None          Veta Palma, DEVONNA 11/08/24 1917    Rogelia Jerilynn RAMAN, MD 11/12/24 [phone]

## 2024-11-08 NOTE — Discharge Instructions (Addendum)
 It was a pleasure taking care of you today. Congratulations on your pregnancy!  You had a positive pregnancy test here today.  Your ultrasound shows that you are about 5 weeks and 6 days along.  Your pregnancy is within the uterus which is the correct location.  No signs of an ectopic pregnancy.  There was no heart activity noted on the ultrasound, but you may be too early to pick this up.  Please follow-up with your gynecologist at your appointment this Friday to repeat your beta-hCG which is your pregnancy hormone and to obtain a repeat ultrasound if they feel that is indicated to ensure your pregnancy is progressing appropriately.   Your estimated due date is 07/05/2025  Please continue to take a daily multivitamin.  You may take up to 1000mg  of tylenol  every 6 hours as needed for pain.  Do not take more then 4g per day.  You had a slight low hemoglobin which is when your blood counts today.  This can be normal in pregnancy.  Please have your PCP or OB continue to monitor this for you.  The remainder of your blood counts electrolytes are normal.  Your kidney, liver, and pancreas labs did not show any significant abnormalities.  You cannot take any NSAID medications such as ibuprofen , Advil , Aleve, BC powders during pregnancy.    Please return to the ER for any severe worsening of your abdominal pain, persistent vomiting, unexplained fevers, any other new or concerning symptoms

## 2024-11-13 DIAGNOSIS — O3680X0 Pregnancy with inconclusive fetal viability, not applicable or unspecified: Secondary | ICD-10-CM | POA: Diagnosis not present

## 2024-11-13 DIAGNOSIS — Z1379 Encounter for other screening for genetic and chromosomal anomalies: Secondary | ICD-10-CM | POA: Diagnosis not present

## 2024-11-26 ENCOUNTER — Emergency Department (HOSPITAL_BASED_OUTPATIENT_CLINIC_OR_DEPARTMENT_OTHER)
Admission: EM | Admit: 2024-11-26 | Discharge: 2024-11-26 | Disposition: A | Discharge status: Home / Self Care | Attending: Emergency Medicine | Admitting: Emergency Medicine

## 2024-11-26 ENCOUNTER — Encounter (HOSPITAL_BASED_OUTPATIENT_CLINIC_OR_DEPARTMENT_OTHER): Payer: Self-pay | Admitting: Emergency Medicine

## 2024-11-26 ENCOUNTER — Other Ambulatory Visit: Payer: Self-pay

## 2024-11-26 ENCOUNTER — Emergency Department (HOSPITAL_BASED_OUTPATIENT_CLINIC_OR_DEPARTMENT_OTHER)

## 2024-11-26 DIAGNOSIS — O26891 Other specified pregnancy related conditions, first trimester: Secondary | ICD-10-CM | POA: Diagnosis not present

## 2024-11-26 DIAGNOSIS — D251 Intramural leiomyoma of uterus: Secondary | ICD-10-CM | POA: Diagnosis not present

## 2024-11-26 DIAGNOSIS — Z3A01 Less than 8 weeks gestation of pregnancy: Secondary | ICD-10-CM | POA: Diagnosis not present

## 2024-11-26 DIAGNOSIS — Z3A08 8 weeks gestation of pregnancy: Secondary | ICD-10-CM | POA: Diagnosis not present

## 2024-11-26 DIAGNOSIS — R1030 Lower abdominal pain, unspecified: Secondary | ICD-10-CM | POA: Insufficient documentation

## 2024-11-26 DIAGNOSIS — O3411 Maternal care for benign tumor of corpus uteri, first trimester: Secondary | ICD-10-CM | POA: Diagnosis not present

## 2024-11-26 LAB — COMPREHENSIVE METABOLIC PANEL WITH GFR
ALT: 48 U/L — ABNORMAL HIGH (ref 0–44)
AST: 27 U/L (ref 15–41)
Albumin: 4.2 g/dL (ref 3.5–5.0)
Alkaline Phosphatase: 39 U/L (ref 38–126)
Anion gap: 9 (ref 5–15)
BUN: 12 mg/dL (ref 6–20)
CO2: 24 mmol/L (ref 22–32)
Calcium: 9.5 mg/dL (ref 8.9–10.3)
Chloride: 102 mmol/L (ref 98–111)
Creatinine, Ser: 0.75 mg/dL (ref 0.44–1.00)
GFR, Estimated: >60 mL/min (ref >60)
Glucose, Bld: 88 mg/dL (ref 70–99)
Potassium: 3.9 mmol/L (ref 3.5–5.1)
Sodium: 135 mmol/L (ref 135–145)
Total Bilirubin: 0.4 mg/dL (ref 0.0–1.2)
Total Protein: 7.5 g/dL (ref 6.5–8.1)

## 2024-11-26 LAB — CBC WITH DIFFERENTIAL/PLATELET
Abs Immature Granulocytes: 0.01 K/uL (ref 0.00–0.07)
Basophils Absolute: 0 K/uL (ref 0.0–0.1)
Basophils Relative: 0 %
Eosinophils Absolute: 0.1 K/uL (ref 0.0–0.5)
Eosinophils Relative: 2 %
HCT: 32.7 % — ABNORMAL LOW (ref 36.0–46.0)
Hemoglobin: 11 g/dL — ABNORMAL LOW (ref 12.0–15.0)
Immature Granulocytes: 0 %
Lymphocytes Relative: 35 %
Lymphs Abs: 1.8 K/uL (ref 0.7–4.0)
MCH: 30.5 pg (ref 26.0–34.0)
MCHC: 33.6 g/dL (ref 30.0–36.0)
MCV: 90.6 fL (ref 80.0–100.0)
Monocytes Absolute: 0.6 K/uL (ref 0.1–1.0)
Monocytes Relative: 12 %
Neutro Abs: 2.7 K/uL (ref 1.7–7.7)
Neutrophils Relative %: 51 %
Platelets: 307 K/uL (ref 150–400)
RBC: 3.61 MIL/uL — ABNORMAL LOW (ref 3.87–5.11)
RDW: 13.7 % (ref 11.5–15.5)
WBC: 5.2 K/uL (ref 4.0–10.5)
nRBC: 0 % (ref 0.0–0.2)

## 2024-11-26 LAB — HCG, QUANTITATIVE, PREGNANCY: hCG, Beta Chain, Quant, S: 76031 m[IU]/mL — ABNORMAL HIGH (ref <5)

## 2024-11-26 LAB — URINALYSIS, ROUTINE W REFLEX MICROSCOPIC
Bilirubin Urine: NEGATIVE
Glucose, UA: NEGATIVE mg/dL
Hgb urine dipstick: NEGATIVE
Ketones, ur: NEGATIVE mg/dL
Leukocytes,Ua: NEGATIVE
Nitrite: NEGATIVE
Protein, ur: NEGATIVE mg/dL
Specific Gravity, Urine: 1.018 (ref 1.005–1.030)
pH: 6 (ref 5.0–8.0)

## 2024-11-26 NOTE — Discharge Instructions (Signed)
 You were seen for your lower abdominal pain in the emergency department.   At home, please use heating pads for your pain.  Do not insert anything into the vagina until you follow-up with your OB/GYN.    Check your MyChart online for the results of any tests that had not resulted by the time you left the emergency department.   Follow-up with your OB/GYN in 2-3 days regarding your visit.    Return immediately to the emergency department if you experience any of the following: Worsening pain, vaginal bleeding, or any other concerning symptoms.    Thank you for visiting our Emergency Department. It was a pleasure taking care of you today.

## 2024-11-26 NOTE — ED Provider Notes (Signed)
 After the patient had completed her workup she was requesting to see the images of her ultrasound.  I attempted to show them to her on the computer in the room which unfortunately was not working.  Attempted to restart the computer just to show her the images and again was unable to access epic.  She stated that she was very upset because no one had come to check on her in a while although she is not having any new symptoms.  She is also upset because she cannot see the images of her US .  Unfortunately this point in time we have a full emergency department without any open rooms and a busy waiting room so there is not another computer that I can take her to so that she can see the images. I printed her out the reports of the ultrasound and her blood work for her convenience. Did print her out a still shot of the ultrasound that showed the IUP with dating.  She is stating that she still wanted to see the images and was starting to become more upset.  She is saying that she wanted to launch a complaint.  Gave her the information for patient relations as well as medical records if she would like to obtain the images that way and wants to file a formal complaint.     Yolande Lamar BROCKS, MD 11/26/24 [phone]

## 2024-11-26 NOTE — ED Triage Notes (Signed)
 C/o spotting and lower abd pain. Approx [redacted] weeks pregnant. Denies unilateral pain. States had OB appt at 6 weeks and was told everything is normal

## 2024-11-26 NOTE — ED Provider Notes (Signed)
 [place] EMERGENCY DEPARTMENT AT [facility] Provider Note   CSN: [id number] Arrival date & time: 11/26/24  1107     Patient presents with: Abdominal Cramping   Valerie Robbins is a 38 y.o. female.   38 year old female G6P3 at approximately 8 weeks pregnancy who presents to the emergency department with lower abdominal cramping.  Says that Sunday she passed what appeared to be possibly products of conception.  Since then has had some mild lower abdominal cramping.  No vaginal bleeding.  No fevers or chills.  No severe pain.  No dysuria or frequency.  No vaginal discharge since then.  Says that she was doing some googling and is worried about a miscarriage.  Has not been sexually active since the pregnancy.  No concern for STIs at this point in time.       Prior to Admission medications   Medication Sig Start Date End Date Taking? Authorizing Provider  cyclobenzaprine  (FLEXERIL ) 10 MG tablet Take 1 tablet (10 mg total) by mouth 3 (three) times daily as needed for muscle spasms. Patient not taking: Reported on 07/08/2024 04/10/24   Levander Slate, MD  drospirenone -ethinyl estradiol  (YAZ) 3-0.02 MG tablet Take 1 tablet by mouth daily. Patient not taking: Reported on 07/08/2024 06/24/24   Eveline Lynwood MATSU, MD  FLUoxetine  (PROZAC ) 20 MG capsule Take 1 capsule (20 mg total) by mouth daily. 04/21/24   McGowen, Aleene DEL, MD  ibuprofen  (ADVIL ) 800 MG tablet Take 1 tablet (800 mg total) by mouth every 8 (eight) hours as needed. 06/04/24   Rudy Carlin LABOR, MD  LORazepam  (ATIVAN ) 1 MG tablet 1-2 tabs 3 times a day as needed for severe anxiety 04/21/24   McGowen, Aleene DEL, MD  Vitamin D , Ergocalciferol , (DRISDOL ) 1.25 MG (50000 UNIT) CAPS capsule Take 1 capsule (50,000 Units total) by mouth every 7 (seven) days. Patient not taking: Reported on 07/08/2024 09/02/23   McGowen, Aleene DEL, MD    Allergies: Patient has no known allergies.    Review of Systems  Updated Vital Signs BP (!) 140/78    Pulse 68   Temp 97.9 F (36.6 C) (Oral)   Resp 17   LMP 10/02/2024   SpO2 100%   Physical Exam Vitals and nursing note reviewed.  Constitutional:      General: She is not in acute distress.    Appearance: She is well-developed.  HENT:     Head: Normocephalic and atraumatic.     Right Ear: External ear normal.     Left Ear: External ear normal.     Nose: Nose normal.  Eyes:     Extraocular Movements: Extraocular movements intact.     Conjunctiva/sclera: Conjunctivae normal.     Pupils: Pupils are equal, round, and reactive to light.  Abdominal:     General: Abdomen is flat. There is no distension.     Palpations: Abdomen is soft. There is no mass.     Tenderness: There is no abdominal tenderness. There is no guarding.  Musculoskeletal:     Cervical back: Normal range of motion and neck supple.  Skin:    General: Skin is warm and dry.  Neurological:     Mental Status: She is alert and oriented to person, place, and time. Mental status is at baseline.  Psychiatric:        Mood and Affect: Mood normal.     (all labs ordered are listed, but only abnormal results are displayed) Labs Reviewed  HCG, QUANTITATIVE, PREGNANCY - Abnormal;  Notable for the following components:      Result Value   hCG, Beta Chain, Quant, S 76,031 (*)    All other components within normal limits  CBC WITH DIFFERENTIAL/PLATELET - Abnormal; Notable for the following components:   RBC 3.61 (*)    Hemoglobin 11.0 (*)    HCT 32.7 (*)    All other components within normal limits  COMPREHENSIVE METABOLIC PANEL WITH GFR - Abnormal; Notable for the following components:   ALT 48 (*)    All other components within normal limits  URINALYSIS, ROUTINE W REFLEX MICROSCOPIC    EKG: None  Radiology: US  OB LESS THAN 14 WEEKS WITH OB TRANSVAGINAL Result Date: 11/26/2024 CLINICAL DATA:  242600 Lower abdominal pain 242600 EXAM: OBSTETRIC <14 WK US  AND TRANSVAGINAL OB US  TECHNIQUE: Both transabdominal and  transvaginal ultrasound examinations were performed for complete evaluation of the gestation as well as the maternal uterus, adnexal regions, and pelvic cul-de-sac. Transvaginal technique was performed to assess early pregnancy. COMPARISON:  November 08, 2024 FINDINGS: Intrauterine gestational sac: Single Yolk sac:  Present Fetal Pole:  Present Cardiac Activity: Present Heart Rate: 178 bpm CRL: 17.7 mm 8w 1d                US  EDC: 06/27/2025 Subchorionic hemorrhage:  None visualized. Maternal uterus/adnexae: Corpus luteum in the right ovary measuring 2.4 x 1.7 x 2.3 cm. Otherwise, the ovaries appear within normal limits. Intramural fibroid in the anterior, lower uterine body measuring 2.2 x 1.2 x 1.5 cm. Trace free fluid in the pelvis. IMPRESSION: Single, live intrauterine gestation with an estimated gestational age of [redacted] weeks, 1 day. Fetal heart rate of 178 beats per minute. Continued routine obstetric and sonographic follow-up is recommended. Electronically Signed   By: Rogelia Myers M.D.   On: 11/26/2024 14:27     Procedures   Medications Ordered in the ED - No data to display                                  Medical Decision Making Amount and/or Complexity of Data Reviewed Labs: ordered. Radiology: ordered.   Valerie Robbins is a 38 year old female G6P3 at approximately 8 weeks pregnancy who presents to the emergency department with lower abdominal cramping.  Initial Ddx:  Miscarriage, UTI, STI, ectopic pregnancy  MDM/Course:  Patient presents to the emergency department due to concerns for miscarriage.  Had some what appeared to be passage of tissues over the weekend.  Has not had any bleeding since then.  Has some more mild lower abdominal discomfort.  No vaginal discharge.  Says that she has not been sexually active since finding out she was pregnant.  No significant tenderness to palpation.  She is overall well-appearing.  Urinalysis without evidence of UTI.  Her hCG  quantitative is rising appropriately and is 76,000.  Had a transvaginal ultrasound which shows an IUP with a heart rate of 178 bpm.  Upon re-evaluation patient remained stable and did not have any bleeding.  Low concern for threatened miscarriage at this point in time since she is not actively having any bleeding.  I have recommended that the patient engage in pelvic rest in case this is the case but will have her follow-up with her OB/GYN as an outpatient  This patient presents to the ED for concern of complaints listed in HPI, this involves an extensive number of treatment options, and is a complaint  that carries with it a high risk of complications and morbidity. Disposition including potential need for admission considered.   Dispo: DC Home. Return precautions discussed including, but not limited to, those listed in the AVS. Allowed pt time to ask questions which were answered fully prior to dc.  Records reviewed Outpatient Clinic Notes The following labs were independently interpreted: Chemistry and show no acute abnormality I have reviewed the patients home medications and made adjustments as needed  Portions of this note were generated with Dragon dictation software. Dictation errors may occur despite best attempts at proofreading.     Final diagnoses:  Lower abdominal pain  [redacted] weeks gestation of pregnancy    ED Discharge Orders     None          Yolande Lamar BROCKS, MD 11/26/24 1610

## 2024-11-26 NOTE — ED Notes (Signed)
 ED Provider at bedside.

## 2024-11-26 NOTE — ED Notes (Signed)

## 2024-12-01 DIAGNOSIS — Z3480 Encounter for supervision of other normal pregnancy, unspecified trimester: Secondary | ICD-10-CM | POA: Diagnosis not present

## 2024-12-11 DIAGNOSIS — D259 Leiomyoma of uterus, unspecified: Secondary | ICD-10-CM | POA: Diagnosis not present

## 2024-12-11 DIAGNOSIS — Z3480 Encounter for supervision of other normal pregnancy, unspecified trimester: Secondary | ICD-10-CM | POA: Diagnosis not present

## 2024-12-11 DIAGNOSIS — Z3201 Encounter for pregnancy test, result positive: Secondary | ICD-10-CM | POA: Diagnosis not present

## 2024-12-11 DIAGNOSIS — O3410 Maternal care for benign tumor of corpus uteri, unspecified trimester: Secondary | ICD-10-CM | POA: Diagnosis not present

## 2025-01-27 ENCOUNTER — Other Ambulatory Visit: Payer: Self-pay
